# Patient Record
Sex: Male | Born: 1950 | Race: White | Hispanic: No | Marital: Married | Smoking: Never smoker
Health system: Southern US, Community
[De-identification: ages and names within clinical notes are randomized; demographics above are authoritative.]

## PROBLEM LIST (undated history)

## (undated) DIAGNOSIS — E291 Testicular hypofunction: Secondary | ICD-10-CM

## (undated) DIAGNOSIS — I10 Essential (primary) hypertension: Secondary | ICD-10-CM

## (undated) DIAGNOSIS — M792 Neuralgia and neuritis, unspecified: Secondary | ICD-10-CM

## (undated) DIAGNOSIS — N200 Calculus of kidney: Secondary | ICD-10-CM

## (undated) DIAGNOSIS — I219 Acute myocardial infarction, unspecified: Secondary | ICD-10-CM

## (undated) DIAGNOSIS — M545 Low back pain, unspecified: Secondary | ICD-10-CM

## (undated) DIAGNOSIS — F329 Major depressive disorder, single episode, unspecified: Secondary | ICD-10-CM

## (undated) DIAGNOSIS — I2510 Atherosclerotic heart disease of native coronary artery without angina pectoris: Secondary | ICD-10-CM

## (undated) DIAGNOSIS — G629 Polyneuropathy, unspecified: Secondary | ICD-10-CM

## (undated) DIAGNOSIS — G8929 Other chronic pain: Secondary | ICD-10-CM

## (undated) DIAGNOSIS — R29898 Other symptoms and signs involving the musculoskeletal system: Secondary | ICD-10-CM

## (undated) DIAGNOSIS — G5600 Carpal tunnel syndrome, unspecified upper limb: Secondary | ICD-10-CM

## (undated) DIAGNOSIS — N529 Male erectile dysfunction, unspecified: Secondary | ICD-10-CM

## (undated) DIAGNOSIS — R011 Cardiac murmur, unspecified: Secondary | ICD-10-CM

## (undated) DIAGNOSIS — G43909 Migraine, unspecified, not intractable, without status migrainosus: Secondary | ICD-10-CM

## (undated) DIAGNOSIS — J302 Other seasonal allergic rhinitis: Secondary | ICD-10-CM

## (undated) DIAGNOSIS — M1990 Unspecified osteoarthritis, unspecified site: Secondary | ICD-10-CM

## (undated) DIAGNOSIS — Z87442 Personal history of urinary calculi: Secondary | ICD-10-CM

## (undated) DIAGNOSIS — E7800 Pure hypercholesterolemia, unspecified: Secondary | ICD-10-CM

## (undated) DIAGNOSIS — F32A Depression, unspecified: Secondary | ICD-10-CM

## (undated) HISTORY — PX: CORONARY ANGIOPLASTY WITH STENT PLACEMENT: SHX49

## (undated) HISTORY — PX: EYE SURGERY: SHX253

## (undated) HISTORY — PX: HAND LIGAMENT RECONSTRUCTION: SHX1726

## (undated) HISTORY — PX: HIP SURGERY: SHX245

## (undated) HISTORY — PX: ORIF FINGER / THUMB FRACTURE: SUR932

## (undated) HISTORY — PX: CATARACT EXTRACTION: SUR2

## (undated) HISTORY — PX: FRACTURE SURGERY: SHX138

## (undated) HISTORY — PX: TONSILLECTOMY AND ADENOIDECTOMY: SUR1326

## (undated) HISTORY — PX: EXTRACORPOREAL SHOCK WAVE LITHOTRIPSY: SHX1557

## (undated) HISTORY — PX: BACK SURGERY: SHX140

## (undated) HISTORY — PX: CORONARY ARTERY BYPASS GRAFT: SHX141

## (undated) HISTORY — PX: KNEE ARTHROSCOPY: SHX127

## (undated) HISTORY — PX: OTHER SURGICAL HISTORY: SHX169

## (undated) HISTORY — PX: JOINT REPLACEMENT: SHX530

---

## 2003-01-14 ENCOUNTER — Encounter: Admission: RE | Admit: 2003-01-14 | Discharge: 2003-01-14 | Payer: Self-pay | Admitting: Neurosurgery

## 2003-01-14 ENCOUNTER — Encounter: Payer: Self-pay | Admitting: Neurosurgery

## 2003-06-18 HISTORY — PX: POSTERIOR LUMBAR FUSION 4 LEVEL: SHX6037

## 2007-01-19 ENCOUNTER — Ambulatory Visit: Payer: Self-pay | Admitting: Urology

## 2007-01-27 ENCOUNTER — Ambulatory Visit: Payer: Self-pay | Admitting: Urology

## 2007-02-05 ENCOUNTER — Ambulatory Visit: Payer: Self-pay | Admitting: Urology

## 2007-02-12 ENCOUNTER — Ambulatory Visit: Payer: Self-pay | Admitting: Urology

## 2007-02-27 ENCOUNTER — Ambulatory Visit: Payer: Self-pay | Admitting: Urology

## 2007-03-24 ENCOUNTER — Ambulatory Visit: Payer: Self-pay | Admitting: Urology

## 2007-04-20 ENCOUNTER — Ambulatory Visit: Payer: Self-pay | Admitting: Urology

## 2007-05-19 ENCOUNTER — Ambulatory Visit: Payer: Self-pay | Admitting: Unknown Physician Specialty

## 2007-11-01 ENCOUNTER — Emergency Department: Payer: Self-pay | Admitting: Emergency Medicine

## 2008-06-30 ENCOUNTER — Ambulatory Visit: Payer: Self-pay | Admitting: Specialist

## 2010-07-17 ENCOUNTER — Ambulatory Visit: Payer: Self-pay | Admitting: Unknown Physician Specialty

## 2010-07-19 LAB — PATHOLOGY REPORT

## 2011-04-04 ENCOUNTER — Ambulatory Visit: Payer: Self-pay | Admitting: General Surgery

## 2011-04-22 ENCOUNTER — Ambulatory Visit: Payer: Self-pay | Admitting: General Surgery

## 2011-04-24 LAB — PATHOLOGY REPORT

## 2011-05-02 ENCOUNTER — Ambulatory Visit: Payer: Self-pay | Admitting: Otolaryngology

## 2011-05-27 ENCOUNTER — Ambulatory Visit: Payer: Self-pay | Admitting: Physical Medicine and Rehabilitation

## 2013-04-06 ENCOUNTER — Ambulatory Visit: Payer: Self-pay | Admitting: Specialist

## 2013-04-06 DIAGNOSIS — I2510 Atherosclerotic heart disease of native coronary artery without angina pectoris: Secondary | ICD-10-CM

## 2013-04-06 LAB — CBC WITH DIFFERENTIAL/PLATELET
Eosinophil %: 2.6 %
HGB: 14 g/dL (ref 13.0–18.0)
Lymphocyte #: 1.6 10*3/uL (ref 1.0–3.6)
Lymphocyte %: 23.8 %
MCH: 24.7 pg — ABNORMAL LOW (ref 26.0–34.0)
MCHC: 32.7 g/dL (ref 32.0–36.0)
MCV: 76 fL — ABNORMAL LOW (ref 80–100)
Monocyte %: 5.3 %
Platelet: 253 10*3/uL (ref 150–440)
RBC: 5.7 10*6/uL (ref 4.40–5.90)
RDW: 17.4 % — ABNORMAL HIGH (ref 11.5–14.5)
WBC: 6.6 10*3/uL (ref 3.8–10.6)

## 2013-04-13 ENCOUNTER — Ambulatory Visit: Payer: Self-pay | Admitting: Specialist

## 2013-06-17 HISTORY — PX: CARPAL TUNNEL RELEASE: SHX101

## 2013-12-18 DIAGNOSIS — M549 Dorsalgia, unspecified: Secondary | ICD-10-CM

## 2013-12-18 DIAGNOSIS — N529 Male erectile dysfunction, unspecified: Secondary | ICD-10-CM | POA: Insufficient documentation

## 2013-12-18 DIAGNOSIS — I10 Essential (primary) hypertension: Secondary | ICD-10-CM | POA: Insufficient documentation

## 2013-12-18 DIAGNOSIS — G8929 Other chronic pain: Secondary | ICD-10-CM | POA: Insufficient documentation

## 2013-12-31 DIAGNOSIS — R739 Hyperglycemia, unspecified: Secondary | ICD-10-CM | POA: Insufficient documentation

## 2014-07-17 ENCOUNTER — Other Ambulatory Visit: Payer: Self-pay

## 2014-07-17 ENCOUNTER — Encounter (HOSPITAL_COMMUNITY)
Admission: EM | Disposition: A | Discharge status: Home / Self Care | Payer: Self-pay | Source: Ambulatory Visit | Attending: Cardiology

## 2014-07-17 ENCOUNTER — Encounter (HOSPITAL_COMMUNITY): Payer: Self-pay | Admitting: Cardiology

## 2014-07-17 ENCOUNTER — Emergency Department: Payer: Self-pay | Admitting: Emergency Medicine

## 2014-07-17 ENCOUNTER — Inpatient Hospital Stay (HOSPITAL_COMMUNITY)
Admission: EM | Admit: 2014-07-17 | Discharge: 2014-07-18 | DRG: 247 | Disposition: A | Discharge status: Home / Self Care | Payer: BC Managed Care – PPO | Source: Ambulatory Visit | Attending: Cardiology | Admitting: Cardiology

## 2014-07-17 DIAGNOSIS — R079 Chest pain, unspecified: Secondary | ICD-10-CM | POA: Diagnosis present

## 2014-07-17 DIAGNOSIS — I252 Old myocardial infarction: Secondary | ICD-10-CM | POA: Diagnosis not present

## 2014-07-17 DIAGNOSIS — I10 Essential (primary) hypertension: Secondary | ICD-10-CM | POA: Diagnosis present

## 2014-07-17 DIAGNOSIS — M549 Dorsalgia, unspecified: Secondary | ICD-10-CM | POA: Diagnosis present

## 2014-07-17 DIAGNOSIS — I2510 Atherosclerotic heart disease of native coronary artery without angina pectoris: Secondary | ICD-10-CM | POA: Diagnosis present

## 2014-07-17 DIAGNOSIS — I237 Postinfarction angina: Secondary | ICD-10-CM | POA: Diagnosis present

## 2014-07-17 DIAGNOSIS — G8929 Other chronic pain: Secondary | ICD-10-CM | POA: Diagnosis present

## 2014-07-17 DIAGNOSIS — I2119 ST elevation (STEMI) myocardial infarction involving other coronary artery of inferior wall: Principal | ICD-10-CM | POA: Diagnosis present

## 2014-07-17 DIAGNOSIS — Z8249 Family history of ischemic heart disease and other diseases of the circulatory system: Secondary | ICD-10-CM

## 2014-07-17 DIAGNOSIS — I219 Acute myocardial infarction, unspecified: Secondary | ICD-10-CM

## 2014-07-17 HISTORY — DX: Essential (primary) hypertension: I10

## 2014-07-17 HISTORY — DX: Acute myocardial infarction, unspecified: I21.9

## 2014-07-17 HISTORY — PX: LEFT HEART CATHETERIZATION WITH CORONARY ANGIOGRAM: SHX5451

## 2014-07-17 LAB — CBC WITH DIFFERENTIAL/PLATELET
BASOS ABS: 0.1 10*3/uL (ref 0.0–0.1)
Basophil %: 0.4 %
EOS ABS: 0.1 10*3/uL (ref 0.0–0.7)
Eosinophil %: 0.3 %
HCT: 48 % (ref 40.0–52.0)
HGB: 15.2 g/dL (ref 13.0–18.0)
LYMPHS PCT: 12.9 %
Lymphocyte #: 2.1 10*3/uL (ref 1.0–3.6)
MCH: 24.6 pg — ABNORMAL LOW (ref 26.0–34.0)
MCHC: 31.7 g/dL — AB (ref 32.0–36.0)
MCV: 78 fL — ABNORMAL LOW (ref 80–100)
Monocyte #: 2 x10 3/mm — ABNORMAL HIGH (ref 0.2–1.0)
Monocyte %: 12.3 %
Neutrophil #: 11.9 10*3/uL — ABNORMAL HIGH (ref 1.4–6.5)
Neutrophil %: 74.1 %
PLATELETS: 284 10*3/uL (ref 150–440)
RBC: 6.17 10*6/uL — ABNORMAL HIGH (ref 4.40–5.90)
RDW: 16.8 % — AB (ref 11.5–14.5)
WBC: 16 10*3/uL — ABNORMAL HIGH (ref 3.8–10.6)

## 2014-07-17 LAB — TROPONIN I
TROPONIN I: 45.7 ng/mL — AB (ref <0.031)
Troponin-I: 25 ng/mL — ABNORMAL HIGH

## 2014-07-17 LAB — POCT I-STAT, CHEM 8
BUN: 16 mg/dL (ref 6–23)
CHLORIDE: 101 mmol/L (ref 96–112)
CREATININE: 1 mg/dL (ref 0.50–1.35)
Calcium, Ion: 1.26 mmol/L (ref 1.13–1.30)
Glucose, Bld: 114 mg/dL — ABNORMAL HIGH (ref 70–99)
HCT: 44 % (ref 39.0–52.0)
Hemoglobin: 15 g/dL (ref 13.0–17.0)
POTASSIUM: 3.6 mmol/L (ref 3.5–5.1)
Sodium: 139 mmol/L (ref 135–145)
TCO2: 24 mmol/L (ref 0–100)

## 2014-07-17 LAB — LIPID PANEL
CHOL/HDL RATIO: 4.5 ratio
Cholesterol: 171 mg/dL (ref 0–200)
HDL: 38 mg/dL — AB (ref >39)
LDL Cholesterol: 117 mg/dL — ABNORMAL HIGH (ref 0–99)
TRIGLYCERIDES: 82 mg/dL (ref <150)
VLDL: 16 mg/dL (ref 0–40)

## 2014-07-17 LAB — POCT ACTIVATED CLOTTING TIME: ACTIVATED CLOTTING TIME: 220 s

## 2014-07-17 LAB — BASIC METABOLIC PANEL
Anion Gap: 7 (ref 7–16)
BUN: 16 mg/dL (ref 7–18)
CALCIUM: 9.4 mg/dL (ref 8.5–10.1)
CHLORIDE: 102 mmol/L (ref 98–107)
Co2: 31 mmol/L (ref 21–32)
Creatinine: 1.31 mg/dL — ABNORMAL HIGH (ref 0.60–1.30)
EGFR (African American): >60
EGFR (Non-African Amer.): 59 — ABNORMAL LOW
Glucose: 103 mg/dL — ABNORMAL HIGH (ref 65–99)
OSMOLALITY: 281 (ref 275–301)
Potassium: 3.7 mmol/L (ref 3.5–5.1)
Sodium: 140 mmol/L (ref 136–145)

## 2014-07-17 LAB — PROTIME-INR
INR: 1
Prothrombin Time: 12.9 secs

## 2014-07-17 LAB — APTT: Activated PTT: 26.9 secs (ref 23.6–35.9)

## 2014-07-17 LAB — TSH: TSH: 3.322 u[IU]/mL (ref 0.350–4.500)

## 2014-07-17 LAB — CK-MB: CK-MB: 131.9 ng/mL — ABNORMAL HIGH (ref 0.5–3.6)

## 2014-07-17 SURGERY — LEFT HEART CATHETERIZATION WITH CORONARY ANGIOGRAM
Anesthesia: LOCAL

## 2014-07-17 MED ORDER — VERAPAMIL HCL 2.5 MG/ML IV SOLN
INTRAVENOUS | Status: AC
  Filled 2014-07-17: qty 2

## 2014-07-17 MED ORDER — TICAGRELOR 90 MG PO TABS
90.0000 mg | ORAL_TABLET | Freq: Two times a day (BID) | ORAL | Status: DC
  Administered 2014-07-18: 90 mg via ORAL
  Filled 2014-07-17 (×2): qty 1

## 2014-07-17 MED ORDER — LIDOCAINE HCL (PF) 1 % IJ SOLN
INTRAMUSCULAR | Status: AC
  Filled 2014-07-17: qty 30

## 2014-07-17 MED ORDER — HYDROMORPHONE HCL 1 MG/ML IJ SOLN
INTRAMUSCULAR | Status: AC
  Filled 2014-07-17: qty 1

## 2014-07-17 MED ORDER — BIVALIRUDIN 250 MG IV SOLR
INTRAVENOUS | Status: AC
  Filled 2014-07-17: qty 250

## 2014-07-17 MED ORDER — HEPARIN (PORCINE) IN NACL 2-0.9 UNIT/ML-% IJ SOLN
INTRAMUSCULAR | Status: AC
  Filled 2014-07-17: qty 1000

## 2014-07-17 MED ORDER — MIDAZOLAM HCL 2 MG/2ML IJ SOLN
INTRAMUSCULAR | Status: AC
  Filled 2014-07-17: qty 2

## 2014-07-17 MED ORDER — NITROGLYCERIN 0.4 MG SL SUBL: 0.4000 mg | SUBLINGUAL_TABLET | SUBLINGUAL | Status: DC | PRN

## 2014-07-17 MED ORDER — CARVEDILOL 6.25 MG PO TABS
6.2500 mg | ORAL_TABLET | Freq: Two times a day (BID) | ORAL | Status: DC
  Administered 2014-07-18: 6.25 mg via ORAL
  Filled 2014-07-17 (×3): qty 1

## 2014-07-17 MED ORDER — ASPIRIN EC 81 MG PO TBEC
81.0000 mg | DELAYED_RELEASE_TABLET | Freq: Every day | ORAL | Status: DC
  Administered 2014-07-18: 81 mg via ORAL
  Filled 2014-07-17: qty 1

## 2014-07-17 MED ORDER — ATORVASTATIN CALCIUM 80 MG PO TABS
80.0000 mg | ORAL_TABLET | Freq: Every day | ORAL | Status: DC
  Filled 2014-07-17: qty 1

## 2014-07-17 MED ORDER — TICAGRELOR 90 MG PO TABS
ORAL_TABLET | ORAL | Status: AC
  Filled 2014-07-17: qty 2

## 2014-07-17 MED ORDER — OXYCODONE-ACETAMINOPHEN 5-325 MG PO TABS: 1.0000 | ORAL_TABLET | ORAL | Status: DC | PRN

## 2014-07-17 MED ORDER — ZOLPIDEM TARTRATE 5 MG PO TABS
5.0000 mg | ORAL_TABLET | Freq: Every evening | ORAL | Status: DC | PRN
  Administered 2014-07-17: 5 mg via ORAL
  Filled 2014-07-17: qty 1

## 2014-07-17 MED ORDER — NITROGLYCERIN 1 MG/10 ML FOR IR/CATH LAB
INTRA_ARTERIAL | Status: AC
  Filled 2014-07-17: qty 10

## 2014-07-17 MED ORDER — ALPRAZOLAM 0.25 MG PO TABS
0.2500 mg | ORAL_TABLET | Freq: Two times a day (BID) | ORAL | Status: DC | PRN
Start: 2014-07-17 — End: 2014-07-18

## 2014-07-17 MED ORDER — ACETAMINOPHEN 325 MG PO TABS
650.0000 mg | ORAL_TABLET | ORAL | Status: DC | PRN
  Administered 2014-07-18: 650 mg via ORAL
  Filled 2014-07-17: qty 2

## 2014-07-17 MED ORDER — TIROFIBAN HCL IV 12.5 MG/250 ML
INTRAVENOUS | Status: AC
  Filled 2014-07-17: qty 250

## 2014-07-17 MED ORDER — SODIUM CHLORIDE 0.9 % IV SOLN
INTRAVENOUS | Status: AC
  Administered 2014-07-17 – 2014-07-18 (×2): via INTRAVENOUS

## 2014-07-17 MED ORDER — HEPARIN SODIUM (PORCINE) 1000 UNIT/ML IJ SOLN
INTRAMUSCULAR | Status: AC
  Filled 2014-07-17: qty 1

## 2014-07-17 NOTE — H&P (Signed)
Adam Lucas is an 64 y.o. male.   Chief Complaint: Chest pain HPI: Patient is a 64 year old Caucasian male with history of hypertension, chronic back pain, who was admitted via [facility] with a diagnosis of ST elevation myocardial infarction. Upon arrival to the hospital, patient still having lingering chest pain, but he had completed his infarct with ST elevation in the infiltrates with formation of Q waves in the inferior leads. He was emergently taken to the Cath Lab due to ongoing chest discomfort.  Patient has been having chest pain for the past 3 days. Worse chest pain was yesterday afternoon in the form of burning sensation to heaviness in the middle of the chest. He thought this was related to GERD, to PPI without any relief.  Past medical history: Hypertension past surgical history: Back surgery in 2005.   family history: There is no family history of premature coronary artery disease although his father had coronary artery disease and myocardial infarction when he was in his 75s.  Social history: He does not recall call, he does not smoke tobacco. No history of illicit drug use.  Medications: Lisinopril, Cymbalta, omeprazole, lisinopril.   Review of Systems - Negative except Chest pain and chronic back pain.  Afebrile, heart rate 64 beats a minute, blood pressure 151/94 mmHg. Respiratory rate 14/m.  General appearance: alert, cooperative, appears stated age, no distress and mildly obese Eyes: negative findings: lids and lashes normal Neck: no adenopathy, no carotid bruit, no JVD, supple, symmetrical, trachea midline and thyroid not enlarged, symmetric, no tenderness/mass/nodules Neck: JVP - normal, carotids 2+= without bruits Resp: clear to auscultation bilaterally Chest wall: no tenderness Cardio: regular rate and rhythm, S1, S2 normal, no murmur, click, rub or gallop GI: soft, non-tender; bowel sounds normal; no masses,  no organomegaly Extremities:  extremities normal, atraumatic, no cyanosis or edema Pulses: 2+ and symmetric Skin: Skin color, texture, turgor normal. No rashes or lesions Neurologic: Grossly normal  No results found for this or any previous visit (from the past 48 hour(s)). No results found.  Labs:  No results found for: WBC, HGB, HCT, MCV, PLT No results for input(s): NA, K, CL, CO2, BUN, CREATININE, CALCIUM, PROT, BILITOT, ALKPHOS, ALT, AST, GLUCOSE in the last 168 hours.  Invalid input(s): LABALBU No results found for: CKTOTAL, CKMB, CKMBINDEX, TROPONINI  Lipid Panel  No results found for: CHOL, TRIG, HDL, CHOLHDL, VLDL, LDLCALC  EKG: 07/17/2014: Normal sinus rhythm, inferior infarct old with persistent ST elevation, early transition of are in the anterior leads, cannot include posterior infarct old. Consider inferior aneurysm formation.   Assessment/Plan 1. Postinfarct unstable angina pectoris 2. Subacute inferior wall myocardial infarction, Q waves noted in inferior leads with persistent S wave. 3. Hypertension  Recommendation: Patient due to ongoing burning sensation in the chest, will be taken to the cardiac catheterization lab on emergent basis. Further recommendations will follow.   [name], MD 07/17/2014, 5:40 PM [facility] Pager: [phone] Office: [phone] If no answer: Cell:  [phone]   \

## 2014-07-17 NOTE — CV Procedure (Signed)
Procedure performed:  Left heart catheterization including hemodynamic monitoring of the left ventricle, LV gram. Selective right and left coronary arteriography. Thrombectomy of the proximal RCA with aspiration of significant amount of thrombus burden.  PTCA and stenting of the proximal and mid RCA with implantation of a 2.75 x 24 mm Synergy DES.TIMI 0 flow improved to TIMI-3 flow at the end of the procedure.  Indication: Patient is a 64 year-old Caucasian male with history of hypertension, chronic backache who is transferred as an emergent cath for ST elevation myocardial infarction involving the inferior wall. On review, patient had completed his inferior infarct, but persisted to have chest discomfort. Hence taken to the catheterization lab on emergent basis.   Hemodynamic data: Left ventricular pressure was 94/2 with LVEDP of 4 mm mercury. Aortic pressure was 94/60 with a mean of 69 mm mercury. There was no pressure gradient across the aortic valve.   Left ventricle: Performed in the RAO projection revealed LVEF of 50-55 %. There was no significant MR. inferolateral hypokinesis.  Right coronary artery: Dominant. Occluded in the proximal segment. Faint collaterals  are noted from the left.  Left main coronary artery is large and normal.  Circumflex coronary artery: A large vessel giving origin to a large obtuse marginal 1.  the circumflex coronary artery of the origin of OM1 has a hazy 50-60% stenosis, etc. very large vessel and supplies a large territory. OM1 has mild disease. Proximal circumflex has mild luminal irregularity.  LAD:  LAD gives origin to a moderate size D1 and small to moderate-sized D2. The LAD in the midsegment has a hazy what appears like an ulcerated 80% stenosis in the midsegment. Excellent brisk flow is evident through the LAD. The diagonal 2 has a ostial 80% to 90% stenosis. It is a small vessel.    Impression:  completed inferior infarct with ongoing postinfarct  angina pectoris. Multivessel disease, right coronary artery being the culprit vessel. Patient has at least an intermediate risk lesion in the very large circumflex coronary artery distal segment. The diagonal lesion although hazy and appears ulcerated in some views, appears to be stable. Will proceed with angiography and angioplasty to the right coronary artery and will reevaluate his left system an elective basis unless he has recurrence of chest pain at rest. He will need repeat procedure once he is stable from acute myocardial infarction.  Interventional data: Successful Thrombectomy of the proximal RCA with aspiration of significant amount of thrombus burden.  PTCA and stenting of the proximal and mid RCA with implantation of a 2.75 x 24 mm Synergy DES.TIMI 0 flow improved to TIMI-3 flow at the end of the procedure.  Will need Dual antiplatelet therapy with  BRILINTA and ASA 81 mg for at least  one year or more.   Technique of diagnostic cardiac catheterization:  Under sterile precautions using a 6 French right radial  arterial access, a 6 French sheath was introduced into the right radial artery. A 5 [place] Tig 4 catheter was advanced into the ascending aorta selective  right coronary artery and left coronary artery was cannulated and angiography was performed in multiple views. The catheter was pulled back Out of the body over exchange length J-wire.  SameCatheter was used to perform LV gram which was performed in  LAO/RAO projection. Catheter exchanged out of the body over J-Wire. NO immediate complications noted.   Technique of intervention:  Using a 6 [place] Ik[place] 1.0  guide catheter tright  coronary  was selected and cannulated.  Using Angiomax for anticoagulation, I utilized a  cougar XTguidewire and across the  right coronary artery with moderate amount of  difficulty. I placed the tip of the wire into the distal  coronary artery. Angiography was performed.  There was no flow in spite of passing  the wire through the occlusion. I then utilized Pronto aspiration catheter and perform thrombectomy with aspiration of a organized large thrombus. Still there was no flow in the right coronary artery, hence I utilized a 2.5 x 15 mm [place]  balloon and balloon angioplasty at 6 and 8 atmospheric pressure was performed. Due to absence of flow, I then repeated aspiration thrombectomy with a fetch 2 catheter, but this is able to established TIMI-3 flow. Ulcerated lesion in the midsegment of the right coronary was evident. I then proceeded with stenting of the segment with the 2.75 x 24 mm Synergy DES deployed at 18 atmospheric pressure for 50 seconds, post dilated with a 3.0 x 20 mm emerge [place] at 16 atmospheric pressure 2. Intracoronary nitroglycerin was administered and angiography was performed. Brisk flow was evident with TIMI-3 flow, without evidence of edge dissection or residual thrombus. Excellent blush was evident in the distal myocardium. The guidewire was withdrawn, angiography repeated, guide catheter disengaged and pulled out of the body.  Patient although the fistula. There was no immediate complication. Hemostasis was obtained by applying TR band.

## 2014-07-17 NOTE — Progress Notes (Signed)
TR band removed at 2208. Site is a level 0. Gauze and tegaderm applied. Pt has no complaints at this time.

## 2014-07-17 NOTE — Progress Notes (Signed)
eLink Physician-Brief Progress Note Patient Name: Adam Lucas DOB: 1950-12-09 MRN: [id number]   Date of Service  07/17/2014  HPI/Events of Note  Admitted with ST elevation MI.   Underwent intervention in RCA. Presently stable from hemodynamic and resp standpoint.  eICU Interventions  Chart reviewed No new recs     Intervention Category Evaluation Type: New Patient Evaluation  Mauri Brooklyn, P 07/17/2014, 6:34 PM

## 2014-07-18 ENCOUNTER — Encounter (HOSPITAL_COMMUNITY): Payer: Self-pay

## 2014-07-18 LAB — MRSA PCR SCREENING: MRSA BY PCR: NEGATIVE

## 2014-07-18 LAB — BASIC METABOLIC PANEL
Anion gap: 6 (ref 5–15)
BUN: 13 mg/dL (ref 6–23)
CO2: 25 mmol/L (ref 19–32)
Calcium: 8.9 mg/dL (ref 8.4–10.5)
Chloride: 105 mmol/L (ref 96–112)
Creatinine, Ser: 1.13 mg/dL (ref 0.50–1.35)
GFR calc non Af Amer: 67 mL/min — ABNORMAL LOW (ref >90)
GFR, EST AFRICAN AMERICAN: 78 mL/min — AB (ref >90)
Glucose, Bld: 118 mg/dL — ABNORMAL HIGH (ref 70–99)
POTASSIUM: 3.9 mmol/L (ref 3.5–5.1)
SODIUM: 136 mmol/L (ref 135–145)

## 2014-07-18 LAB — CBC
HEMATOCRIT: 40.6 % (ref 39.0–52.0)
Hemoglobin: 13 g/dL (ref 13.0–17.0)
MCH: 24.9 pg — ABNORMAL LOW (ref 26.0–34.0)
MCHC: 32 g/dL (ref 30.0–36.0)
MCV: 77.8 fL — ABNORMAL LOW (ref 78.0–100.0)
Platelets: 207 10*3/uL (ref 150–400)
RBC: 5.22 MIL/uL (ref 4.22–5.81)
RDW: 16.6 % — AB (ref 11.5–15.5)
WBC: 14 10*3/uL — ABNORMAL HIGH (ref 4.0–10.5)

## 2014-07-18 LAB — TROPONIN I
TROPONIN I: 41.08 ng/mL — AB (ref <0.031)
Troponin I: 54.55 ng/mL (ref <0.031)

## 2014-07-18 MED ORDER — TICAGRELOR 90 MG PO TABS: 90.0000 mg | ORAL_TABLET | Freq: Two times a day (BID) | ORAL | Status: DC

## 2014-07-18 MED ORDER — CARVEDILOL 6.25 MG PO TABS: 6.2500 mg | ORAL_TABLET | Freq: Two times a day (BID) | ORAL | Status: AC

## 2014-07-18 MED ORDER — ASPIRIN 81 MG PO TBEC
81.0000 mg | DELAYED_RELEASE_TABLET | Freq: Every day | ORAL | Status: DC
Start: 2014-07-18 — End: 2017-10-22

## 2014-07-18 MED ORDER — OMEPRAZOLE 40 MG PO CPDR: 40.0000 mg | DELAYED_RELEASE_CAPSULE | Freq: Every day | ORAL | Status: DC

## 2014-07-18 MED ORDER — NITROGLYCERIN 0.4 MG SL SUBL: 0.4000 mg | SUBLINGUAL_TABLET | SUBLINGUAL | Status: DC | PRN

## 2014-07-18 MED ORDER — ATORVASTATIN CALCIUM 80 MG PO TABS: 80.0000 mg | ORAL_TABLET | Freq: Every day | ORAL | Status: DC

## 2014-07-18 MED FILL — Sodium Chloride IV Soln 0.9%: INTRAVENOUS | Qty: 50 | Status: AC

## 2014-07-18 NOTE — Care Management Note (Signed)
    Page 1 of 1   07/18/2014     10:15:40 AM CARE MANAGEMENT NOTE 07/18/2014  Patient:  Adam Lucas, Adam Lucas   Account Number:  [id number]  Date Initiated:  07/18/2014  Documentation initiated by:  Elissa Hefty  Subjective/Objective Assessment:   adm w mi     Action/Plan:   lives w wife, pt states bcbs ins   Anticipated DC Date:     Anticipated DC Plan:  [place]  CM consult  Medication Assistance      Choice offered to / List presented to:             Status of service:   Medicare Important Message given?   (If response is "NO", the following Medicare IM given date fields will be blank) Date Medicare IM given:   Medicare IM given by:   Date Additional Medicare IM given:   Additional Medicare IM given by:    Discharge Disposition:  HOME/SELF CARE  Per UR Regulation:  Reviewed for med. necessity/level of care/duration of stay  If discussed at [place] of Stay Meetings, dates discussed:    Comments:  2/1  1014 debbie Tuvia Woodrick rn,bsn gave pt 30day free and copay assist card for brilinta. pt states has bcbs ins.

## 2014-07-18 NOTE — Progress Notes (Addendum)
CARDIAC REHAB PHASE I   PRE:  Rate/Rhythm: 97 SR  BP:  Supine:   Sitting: 108/61  Standing:    SaO2: 96 RA  MODE:  Ambulation: 350 ft   POST:  Rate/Rhythm: 123 ST  BP:  Supine:   Sitting: 111/58  Standing:    SaO2: 98 RA 1120--1215 Pt tolerated ambulation well without c/o of cp or SOB.He c/o of back discomfort walking. HR after walk 123 ST, reported to RN. Completed MI and stent education with pt. Pt voices understanding. Gave pt light walking guidelines. We did discuss Outpt. CRP and I gave him information on program in [place]. He knows that he will have to have follow up with Dr Einar Gip before getting started in that program.  Rodney Langton RN 07/18/2014 12:16 PM

## 2014-07-18 NOTE — Discharge Summary (Addendum)
Physician Discharge Summary  Patient ID: Adam Lucas MRN: [id number] DOB/AGE: [birth year] 64 y.o.  Admit date: 07/17/2014 Discharge date: 07/18/2014  Primary Discharge Diagnosis: Post infarct angina pectoris. STEMI (inferior wall probably <24 hours duration)  s/p thromectomy, PTCA and stenting of the proximal and mid RCA with implantation of a 2.75 x 24 mm Synergy DES. CAD of the native coronary vessel  Secondary diagnosis: Hypertension Hyperlipidemia  Significant Diagnostic Studies: Coronary angiography 07/18/2014: completed inferior infarct with ongoing postinfarct angina pectoris. Multivessel disease, right coronary artery being the culprit vessel. Successful thrombectomy followed by  stenting of the proximal and mid RCA with implantation of a 2.75 x 24 mm Synergy DES.  Hospital Course:  Patient is a 64 year old Caucasian male with history of hypertension, chronic back pain, who was admitted via [facility] with a diagnosis of ST elevation myocardial infarction. Upon arrival to the hospital, patient still having lingering chest pain, but he had completed his infarct with ST elevation in the infiltrates with formation of Q waves in the inferior leads. He was emergently taken to the Cath Lab due to ongoing chest discomfort.  Successful thrombectomy of the proximal RCA and PTCA and stenting of the proximal and mid RCA.    He denies any chest pain presently.  He reports mild, intermittent SOB but otherwise feels well.  Sitting up in chair eating breakfast presently. Dressing remains on right radial access site, no bleeding or hematoma, 2+ radial pulse.  Recommendations on discharge: Follow up in office in 1 week.  Will likely need repeat angiography and angioplasty of circumflex lesion/LAD on an elective basis. Continue on Brilinta and ASA 81mg  for at least one year. Continue carvedilol and atorvastatin at discharge.   Patient has at least an intermediate risk lesion in the  very large circumflex coronary artery distal segment probably 70-80% in some views and appears hazy. The LAD/diagonal lesion although hazy and appears ulcerated in some views, appears to be stable but has high grade stenosis of 80%. Will reevaluate his left system on an elective basis unless he has recurrence of chest pain at rest. He will need repeat procedure once he is stable from acute myocardial infarction probably in 1 week to 10 days and patient advised not to do heavy exertional activity for now and to call if recurrent chest pain.  Discharge Exam: Blood pressure 109/72, pulse 93, temperature 98.9 F (37.2 C), temperature source Oral, resp. rate 23, height 5\' 9"  (1.753 m), weight 90.6 kg (199 lb 11.8 oz), SpO2 93 %.    General appearance: alert, cooperative, appears stated age, no distress and mildly obese Neck: no adenopathy, no carotid bruit, no JVD, supple, symmetrical, trachea midline and thyroid not enlarged, symmetric, no tenderness/mass/nodules Resp: clear to auscultation bilaterally Chest wall: no tenderness Cardio: regular rate and rhythm, S1, S2 normal, II/VI SEM in the aortic area, No click, rub or gallop Extremities: extremities normal, atraumatic, no cyanosis or edema Pulses: 2+ and symmetric, right radial access has healed well. Skin: Skin color, texture, turgor normal. No rashes or lesions Labs:   Lab Results  Component Value Date   WBC 14.0* 07/18/2014   HGB 13.0 07/18/2014   HCT 40.6 07/18/2014   MCV 77.8* 07/18/2014   PLT 207 07/18/2014    Recent Labs Lab 07/18/14 0626  NA 136  K 3.9  CL 105  CO2 25  BUN 13  CREATININE 1.13  CALCIUM 8.9  GLUCOSE 118*   Lab Results  Component Value Date  TROPONINI 41.08* 07/18/2014    Lipid Panel     Component Value Date/Time   CHOL 171 07/17/2014 1830   TRIG 82 07/17/2014 1830   HDL 38* 07/17/2014 1830   CHOLHDL 4.5 07/17/2014 1830   VLDL 16 07/17/2014 1830   LDLCALC 117* 07/17/2014 1830    EKG: 07/18/2014:  Normal sinus rhythm, left axis deviation, left anterior fascicular block, inferior infarct old, early transition of r wave in the anterior leads, cannot include posterior infarct old.  FOLLOW UP PLANS AND APPOINTMENTS    Medication List    STOP taking these medications        lisinopril 10 MG tablet  Commonly known as:  PRINIVIL,ZESTRIL      TAKE these medications        aspirin 81 MG EC tablet  Take 1 tablet (81 mg total) by mouth daily.     atorvastatin 80 MG tablet  Commonly known as:  LIPITOR  Take 1 tablet (80 mg total) by mouth daily at 6 PM.     carvedilol 6.25 MG tablet  Commonly known as:  COREG  Take 1 tablet (6.25 mg total) by mouth 2 (two) times daily with a meal.     cyanocobalamin 500 MCG tablet  Take 1 tablet by mouth daily.     DULoxetine 60 MG capsule  Commonly known as:  CYMBALTA  Take 1 capsule by mouth daily.     nitroGLYCERIN 0.4 MG SL tablet  Commonly known as:  NITROSTAT  Place 1 tablet (0.4 mg total) under the tongue every 5 (five) minutes x 3 doses as needed for chest pain.     omeprazole 40 MG capsule  Commonly known as:  PRILOSEC  Take 1 capsule (40 mg total) by mouth daily before breakfast.     pregabalin 150 MG capsule  Commonly known as:  LYRICA  Take 1 tablet by mouth 2 (two) times daily.     ticagrelor 90 MG Tabs tablet  Commonly known as:  BRILINTA  Take 1 tablet (90 mg total) by mouth 2 (two) times daily.     traMADol 50 MG tablet  Commonly known as:  ULTRAM  Take 2 tablets by mouth 3 (three) times daily as needed.     TYLENOL ARTHRITIS PAIN PO  Take 2 tablets by mouth daily.     VITAMIN C PO  Take 1 tablet by mouth daily.          Rachel Bo, NP-C 07/18/2014, 8:57 AM [facility] Pager: [phone] Office: [phone]  I have personally reviewed the patient's record and performed physical exam and agree with the assessment and plan of Ms. Neldon Labella, NP-C.  [name], MD [facility] Pager: [phone].   Office: [phone] If no answer: Mobile: [phone]

## 2014-08-09 ENCOUNTER — Encounter (HOSPITAL_COMMUNITY)
Admission: RE | Disposition: A | Discharge status: Home / Self Care | Payer: BC Managed Care – PPO | Source: Ambulatory Visit | Attending: Cardiology

## 2014-08-09 ENCOUNTER — Ambulatory Visit (HOSPITAL_COMMUNITY)
Admission: RE | Admit: 2014-08-09 | Discharge: 2014-08-10 | Disposition: A | Discharge status: Home / Self Care | Payer: BC Managed Care – PPO | Source: Ambulatory Visit | Attending: Cardiology | Admitting: Cardiology

## 2014-08-09 ENCOUNTER — Encounter (HOSPITAL_COMMUNITY): Payer: Self-pay | Admitting: General Practice

## 2014-08-09 DIAGNOSIS — I2119 ST elevation (STEMI) myocardial infarction involving other coronary artery of inferior wall: Secondary | ICD-10-CM

## 2014-08-09 DIAGNOSIS — M545 Low back pain: Secondary | ICD-10-CM | POA: Diagnosis not present

## 2014-08-09 DIAGNOSIS — I25119 Atherosclerotic heart disease of native coronary artery with unspecified angina pectoris: Secondary | ICD-10-CM

## 2014-08-09 DIAGNOSIS — G8929 Other chronic pain: Secondary | ICD-10-CM | POA: Insufficient documentation

## 2014-08-09 DIAGNOSIS — I10 Essential (primary) hypertension: Secondary | ICD-10-CM | POA: Insufficient documentation

## 2014-08-09 DIAGNOSIS — I2510 Atherosclerotic heart disease of native coronary artery without angina pectoris: Secondary | ICD-10-CM | POA: Diagnosis present

## 2014-08-09 DIAGNOSIS — I252 Old myocardial infarction: Secondary | ICD-10-CM | POA: Insufficient documentation

## 2014-08-09 DIAGNOSIS — Z955 Presence of coronary angioplasty implant and graft: Secondary | ICD-10-CM

## 2014-08-09 DIAGNOSIS — E780 Pure hypercholesterolemia: Secondary | ICD-10-CM | POA: Diagnosis not present

## 2014-08-09 DIAGNOSIS — M1990 Unspecified osteoarthritis, unspecified site: Secondary | ICD-10-CM | POA: Insufficient documentation

## 2014-08-09 DIAGNOSIS — E785 Hyperlipidemia, unspecified: Secondary | ICD-10-CM | POA: Insufficient documentation

## 2014-08-09 HISTORY — DX: Low back pain, unspecified: M54.50

## 2014-08-09 HISTORY — DX: Acute myocardial infarction, unspecified: I21.9

## 2014-08-09 HISTORY — DX: Migraine, unspecified, not intractable, without status migrainosus: G43.909

## 2014-08-09 HISTORY — DX: Calculus of kidney: N20.0

## 2014-08-09 HISTORY — DX: Polyneuropathy, unspecified: G62.9

## 2014-08-09 HISTORY — PX: CORONARY ANGIOGRAM: SHX5466

## 2014-08-09 HISTORY — DX: Low back pain: M54.5

## 2014-08-09 HISTORY — DX: Other chronic pain: G89.29

## 2014-08-09 HISTORY — DX: Cardiac murmur, unspecified: R01.1

## 2014-08-09 HISTORY — PX: PERCUTANEOUS CORONARY STENT INTERVENTION (PCI-S): SHX5485

## 2014-08-09 HISTORY — DX: Pure hypercholesterolemia, unspecified: E78.00

## 2014-08-09 HISTORY — DX: Atherosclerotic heart disease of native coronary artery without angina pectoris: I25.10

## 2014-08-09 HISTORY — DX: Unspecified osteoarthritis, unspecified site: M19.90

## 2014-08-09 LAB — PROTIME-INR
INR: 1.06 (ref 0.00–1.49)
Prothrombin Time: 14 seconds (ref 11.6–15.2)

## 2014-08-09 LAB — POCT ACTIVATED CLOTTING TIME: Activated Clotting Time: 331 seconds

## 2014-08-09 SURGERY — CORONARY ANGIOGRAM

## 2014-08-09 MED ORDER — TRAMADOL HCL 50 MG PO TABS: 100.0000 mg | ORAL_TABLET | Freq: Three times a day (TID) | ORAL | Status: DC | PRN

## 2014-08-09 MED ORDER — SODIUM CHLORIDE 0.9 % IJ SOLN: 3.0000 mL | Freq: Two times a day (BID) | INTRAMUSCULAR | Status: DC

## 2014-08-09 MED ORDER — SODIUM CHLORIDE 0.9 % IV SOLN
Freq: Once | INTRAVENOUS | Status: AC
  Administered 2014-08-09: 10:00:00 via INTRAVENOUS

## 2014-08-09 MED ORDER — NITROGLYCERIN 1 MG/10 ML FOR IR/CATH LAB
INTRA_ARTERIAL | Status: AC
  Filled 2014-08-09: qty 10

## 2014-08-09 MED ORDER — SODIUM CHLORIDE 0.9 % IV SOLN: 1.0000 mL/kg/h | INTRAVENOUS | Status: AC

## 2014-08-09 MED ORDER — CARVEDILOL 3.125 MG PO TABS
6.2500 mg | ORAL_TABLET | Freq: Two times a day (BID) | ORAL | Status: DC
  Administered 2014-08-09: 6.25 mg via ORAL
  Filled 2014-08-09: qty 2

## 2014-08-09 MED ORDER — ASPIRIN 81 MG PO CHEW: 81.0000 mg | CHEWABLE_TABLET | ORAL | Status: DC

## 2014-08-09 MED ORDER — ZOLPIDEM TARTRATE 5 MG PO TABS: 5.0000 mg | ORAL_TABLET | Freq: Every evening | ORAL | Status: DC | PRN

## 2014-08-09 MED ORDER — NITROGLYCERIN 0.4 MG SL SUBL: 0.4000 mg | SUBLINGUAL_TABLET | SUBLINGUAL | Status: DC | PRN

## 2014-08-09 MED ORDER — SODIUM CHLORIDE 0.9 % IV SOLN
0.2500 mg/kg/h | INTRAVENOUS | Status: DC
  Filled 2014-08-09: qty 250

## 2014-08-09 MED ORDER — ASPIRIN EC 81 MG PO TBEC: 81.0000 mg | DELAYED_RELEASE_TABLET | Freq: Every day | ORAL | Status: DC

## 2014-08-09 MED ORDER — SODIUM CHLORIDE 0.9 % IV SOLN: 250.0000 mL | INTRAVENOUS | Status: DC | PRN

## 2014-08-09 MED ORDER — ATORVASTATIN CALCIUM 80 MG PO TABS
80.0000 mg | ORAL_TABLET | Freq: Every day | ORAL | Status: DC
  Administered 2014-08-09: 80 mg via ORAL
  Filled 2014-08-09 (×2): qty 1

## 2014-08-09 MED ORDER — DULOXETINE HCL 60 MG PO CPEP
60.0000 mg | ORAL_CAPSULE | Freq: Every day | ORAL | Status: DC
Start: 2014-08-10 — End: 2014-08-10
  Filled 2014-08-09: qty 1

## 2014-08-09 MED ORDER — ONDANSETRON HCL 4 MG/2ML IJ SOLN: 4.0000 mg | Freq: Four times a day (QID) | INTRAMUSCULAR | Status: DC | PRN

## 2014-08-09 MED ORDER — SODIUM CHLORIDE 0.9 % IJ SOLN: 3.0000 mL | INTRAMUSCULAR | Status: DC | PRN

## 2014-08-09 MED ORDER — VERAPAMIL HCL 2.5 MG/ML IV SOLN
INTRAVENOUS | Status: AC
  Filled 2014-08-09: qty 2

## 2014-08-09 MED ORDER — HYDROMORPHONE HCL 1 MG/ML IJ SOLN
INTRAMUSCULAR | Status: AC
  Filled 2014-08-09: qty 1

## 2014-08-09 MED ORDER — ACETAMINOPHEN 325 MG PO TABS
650.0000 mg | ORAL_TABLET | Freq: Three times a day (TID) | ORAL | Status: DC
  Administered 2014-08-09: 650 mg via ORAL
  Filled 2014-08-09: qty 2

## 2014-08-09 MED ORDER — CYANOCOBALAMIN 500 MCG PO TABS
500.0000 ug | ORAL_TABLET | Freq: Every day | ORAL | Status: DC
  Filled 2014-08-09 (×2): qty 1

## 2014-08-09 MED ORDER — HEPARIN (PORCINE) IN NACL 2-0.9 UNIT/ML-% IJ SOLN
INTRAMUSCULAR | Status: AC
  Filled 2014-08-09: qty 1000

## 2014-08-09 MED ORDER — PREGABALIN 25 MG PO CAPS
150.0000 mg | ORAL_CAPSULE | Freq: Two times a day (BID) | ORAL | Status: DC
  Administered 2014-08-09: 150 mg via ORAL
  Filled 2014-08-09: qty 2
  Filled 2014-08-09: qty 1

## 2014-08-09 MED ORDER — LIDOCAINE HCL (PF) 1 % IJ SOLN
INTRAMUSCULAR | Status: AC
  Filled 2014-08-09: qty 30

## 2014-08-09 MED ORDER — SODIUM CHLORIDE 0.9 % IV SOLN: INTRAVENOUS | Status: DC

## 2014-08-09 MED ORDER — MIDAZOLAM HCL 2 MG/2ML IJ SOLN
INTRAMUSCULAR | Status: AC
  Filled 2014-08-09: qty 2

## 2014-08-09 MED ORDER — BIVALIRUDIN 250 MG IV SOLR
INTRAVENOUS | Status: AC
  Filled 2014-08-09: qty 250

## 2014-08-09 MED ORDER — PANTOPRAZOLE SODIUM 40 MG PO TBEC
40.0000 mg | DELAYED_RELEASE_TABLET | Freq: Every day | ORAL | Status: DC
  Administered 2014-08-09: 16:00:00 40 mg via ORAL
  Filled 2014-08-09: qty 1

## 2014-08-09 MED ORDER — TICAGRELOR 90 MG PO TABS
90.0000 mg | ORAL_TABLET | Freq: Two times a day (BID) | ORAL | Status: DC
  Administered 2014-08-09: 22:00:00 90 mg via ORAL
  Filled 2014-08-09 (×3): qty 1

## 2014-08-09 MED ORDER — ACETAMINOPHEN ER 650 MG PO TBCR: 650.0000 mg | EXTENDED_RELEASE_TABLET | Freq: Three times a day (TID) | ORAL | Status: DC

## 2014-08-09 NOTE — Progress Notes (Signed)
Toni Arthurs RN assuming care for D. Young lunch relief

## 2014-08-09 NOTE — CV Procedure (Signed)
Procedure performed: Selective  left coronary arteriography. PTCA and stenting to Circumflex and LAD on 08/09/2014: Distal circumflex 80% stenosis s/p 4.0x28 Promus Premier DES and Mid LAD 80% stenosis S/P 2.75x12 mm Promus premier DES. Diagonal 2 to ostial 90% stenosis, small. Diffuse disease medical therapy.  Indication: Patient is a 64 year-old Caucasian male with history of hypertension,  hyperlipidemia, history of inferior and posterior acute myocardial infarction, presenting with post infarct angina on 07/17/2014 and underwent successful angioplasty to an occluded dominant RCA in the proximal segment, status post thrombectomy followed by 2.75 x 24 mm Synergy DES in the proximal to midsegment of the RCA, postdilated with a 3.0 x 20 mm [place].  He has LVEF 50% with inferolateral hypokinesis. He was found to have residual mid LAD stenosis and mid circumflex stenosis. Hence was electively brought for repeat coronary angiography and possible angioplasty.  Hemodynamic data: Aortic pressure was 119/78 with a mean of 98 mm mercury.   Angiographic data:  Left main coronary artery is large and normal, bifurcates immediately into LAD and circumflex.  Circumflex coronary artery: A large vessel giving origin to a large obtuse marginal 1. The circumflex coronary artery of the origin of OM1 has a hazy 80% stenosis. It is a. very large vessel and supplies a large territory. OM1 has mild disease. Proximal circumflex has mild luminal irregularity.  LAD: LAD gives origin to a moderate size D1 and small to moderate-sized D2. The LAD has mild to moderate diffuse disease and is much smaller than the circumflex coronary artery. In the midsegment there is a 80-90% focal stenosis followed by eccentric 30-40% stenosis in the mid to distal segment. Diagonal 2 has a ostial  90% stenosis. It is a small vessel.  Right coronary artery: Not studied.  Impression: PTCA and stenting to Circumflex and LAD on 08/09/2014: Distal  circumflex 80% stenosis s/p 4.0x28 Promus Premier DES and Mid LAD 80% stenosis S/P 2.75x12 mm Promus premier DES.  Medical therapy for D2 disease. LAD shows diffuse disease.  Technique of diagnostic cardiac catheterization:  Under sterile precautions using a 6 French right radial  arterial access, a 6 French sheath was introduced into the right radial artery. A 5 [place] Tig 4 catheter was advanced into the ascending aorta selective   left coronary artery was cannulated and angiography was performed in multiple views. The catheter was pulled back Out of the body over exchange length J-wire.  Technique of intervention:  Using a 6 [place] XB 3.5 guide catheter the left circumflex  coronary  was selected and cannulated. Using Angiomax for anticoagulation, I utilized a cougar XT guidewire and across the circumflex coronary artery without any difficulty. I placed the tip of the wire into the distal  coronary artery. Angiography was performed. I then directly stented this with the 4.0 x 28 mm Promus Premier DES deployed at 10 atmospheric pressure for 60 seconds. The stent was post dilated in the mid and proximal segments with a 4.0 x 12 mm [place] at 12 and 14 atmospheric pressure for 30 seconds each. Intracoronary nitroglycerin was administered and angiography was repeated. The guidewire was withdrawn and the guide catheter disengaged from the circumflex coronary artery.  The guide catheter was then manipulated to engage the LAD selectively. The same guidewire was utilized to engage and across the LAD and angiography was repeated in multiple views to confirm severity of LAD stenosis. This was followed by direct implantation of a 2.75 x 12 mm Promus Premier DES in the midsegment  of the LAD deployed at 14 atmospheric pressure for 60 seconds and postdilated with a 2.75 x 8 mm [place] at 18 atmospheric pressure for 45 seconds. IC nitroglycerin administered and angiography repeated, excellent result was evident with  TIMI-3 flow without edge dissection. The guidewire withdrawn, guide catheter disengaged and pulled out of the body over the J-wire. Hemostasis was obtained by applying TR band. A total of 1 60 mL of contrast was utilized for diagnostic angiography and interventional procedure.

## 2014-08-09 NOTE — Interval H&P Note (Signed)
History and Physical Interval Note: Patient is a 64 year old Caucasian male with history of hypertension, chronic back pain, who was admitted via [facility] with a diagnosis of ST elevation myocardial infarction on 07/17/2014. Upon arrival to the hospital, patient still having lingering chest pain, but he had completed his infarct with ST elevation in the infiltrates with formation of Q waves in the inferior leads. He was emergently taken to the Cath Lab due to ongoing chest discomfort. Successful thrombectomy of the proximal RCA and PTCA and stenting of the proximal and mid RCA.  He denies any chest pain presently.Otherwise feels well.  Patient has at least an intermediate risk lesion in the very large circumflex coronary artery distal segment probably 70-80% in some views and appears hazy. The LAD/diagonal lesion although hazy and appears ulcerated in some views, appears to be stable but has high grade stenosis of 80%. Hence scheduled for elective repeat angiography and angioplasty. Patient has not exerted since discharge and has not had recurrent chest pain.  08/09/2014 10:04 AM  [name]  has presented today for surgery, with the diagnosis of Urgent  The various methods of treatment have been discussed with the patient and family. After consideration of risks, benefits and other options for treatment, the patient has consented to  Procedure(s): LEFT HEART CATHETERIZATION WITH CORONARY ANGIOGRAM (N/A) and possible PCI as a surgical intervention .  The patient's history has been reviewed, patient examined, no change in status, stable for surgery.  I have reviewed the patient's chart and labs.  Questions were answered to the patient's satisfaction.   Cath Lab Visit (complete for each Cath Lab visit)  Clinical Evaluation Leading to the Procedure:   ACS: Yes.    Non-ACS:    Anginal Classification: CCS IV  Anti-ischemic medical therapy: Minimal Therapy (1 class of  medications)  Non-Invasive Test Results: No non-invasive testing performed  Prior CABG: No previous CABG        [facility]

## 2014-08-10 DIAGNOSIS — G8929 Other chronic pain: Secondary | ICD-10-CM | POA: Diagnosis not present

## 2014-08-10 DIAGNOSIS — I2510 Atherosclerotic heart disease of native coronary artery without angina pectoris: Secondary | ICD-10-CM | POA: Diagnosis not present

## 2014-08-10 DIAGNOSIS — I252 Old myocardial infarction: Secondary | ICD-10-CM | POA: Diagnosis not present

## 2014-08-10 DIAGNOSIS — I10 Essential (primary) hypertension: Secondary | ICD-10-CM | POA: Diagnosis not present

## 2014-08-10 LAB — BASIC METABOLIC PANEL
ANION GAP: 4 — AB (ref 5–15)
BUN: 14 mg/dL (ref 6–23)
CALCIUM: 8.8 mg/dL (ref 8.4–10.5)
CO2: 29 mmol/L (ref 19–32)
CREATININE: 1.12 mg/dL (ref 0.50–1.35)
Chloride: 105 mmol/L (ref 96–112)
GFR calc non Af Amer: 68 mL/min — ABNORMAL LOW (ref >90)
GFR, EST AFRICAN AMERICAN: 78 mL/min — AB (ref >90)
Glucose, Bld: 128 mg/dL — ABNORMAL HIGH (ref 70–99)
POTASSIUM: 4.2 mmol/L (ref 3.5–5.1)
Sodium: 138 mmol/L (ref 135–145)

## 2014-08-10 LAB — CBC
HCT: 40.7 % (ref 39.0–52.0)
HEMOGLOBIN: 12.8 g/dL — AB (ref 13.0–17.0)
MCH: 24.6 pg — AB (ref 26.0–34.0)
MCHC: 31.4 g/dL (ref 30.0–36.0)
MCV: 78.1 fL (ref 78.0–100.0)
PLATELETS: 222 10*3/uL (ref 150–400)
RBC: 5.21 MIL/uL (ref 4.22–5.81)
RDW: 15.8 % — ABNORMAL HIGH (ref 11.5–15.5)
WBC: 7.4 10*3/uL (ref 4.0–10.5)

## 2014-08-10 MED FILL — Sodium Chloride IV Soln 0.9%: INTRAVENOUS | Qty: 50 | Status: AC

## 2014-08-10 NOTE — Discharge Summary (Signed)
Physician Discharge Summary  Patient ID: Adam Lucas MRN: [id number] DOB/AGE: 1951-05-22 64 y.o.  Admit date: 08/09/2014 Discharge date: 08/10/2014  Primary Discharge Diagnosis: CAD of the native coronary arteries, PTCA and stenting of the Cx and LAD. Secondary Discharge Diagnosis: HTN, HLD  Significant Diagnostic Studies: Coronary angiogram 08/10/2014: Distal circumflex 80% stenosis s/p 4.0x28 Promus Premier DES and Mid LAD 80% stenosis S/P 2.75x12 mm Promus premier DES. Medical therapy for D2 disease. LAD shows diffuse disease. H/O post STEMI angina on 07/17/2014 S/P proximal and mid RCA with implantation of a 2.75 x 24 mm Synergy DES.  Hospital Course: Patient is a 64 year old Caucasian male with history of hypertension, chronic back pain, who was admitted on 07/17/2014 for STEMI and underwent successful thrombectomy of the proximal RCA and PTCA and stenting of the proximal and mid RCA with residual stenosis to the LAD, D2, and Cx. After allowing for adequate time for stabilization from STEMI, he was brought back to the cath lab on 08/09/2014 on an elective basis to reevaluate his left system. He had not exerted himself since previous discharge and has not had recurrent chest pain.  He underwent successful PTCA and stenting to Circumflex with 4.0x28 Promus Premier DES and LAD with 2.75x12 mm Promus premier DES.   Recommendations on discharge: Continue DAPT with ASA and Brilinta for at least one year with aggressive risk factor modification. Follow up outpatient in 1 week.     Discharge Exam: Blood pressure 125/75, pulse 72, temperature 98.2 F (36.8 C), temperature source Oral, resp. rate 20, height 5\' 9"  (1.753 m), weight 93.6 kg (206 lb 5.6 oz), SpO2 98 %.    General appearance: alert, cooperative, appears stated age, no distress and mildly obese Neck: no adenopathy, no carotid bruit, no JVD, supple, symmetrical, trachea midline and thyroid not enlarged, symmetric, no  tenderness/mass/nodules Resp: clear to auscultation bilaterally Chest wall: no tenderness Cardio: regular rate and rhythm, S1, S2 normal, II/VI SEM in the aortic area, No click, rub or gallop Extremities: extremities normal, atraumatic, no cyanosis or edema Pulses: 2+ and symmetric, right radial access has healed well. Skin: Skin color, texture, turgor normal. No rashes or lesions  Labs:   Lab Results  Component Value Date   WBC 7.4 08/10/2014   HGB 12.8* 08/10/2014   HCT 40.7 08/10/2014   MCV 78.1 08/10/2014   PLT 222 08/10/2014    Recent Labs Lab 08/10/14 0243  NA 138  K 4.2  CL 105  CO2 29  BUN 14  CREATININE 1.12  CALCIUM 8.8  GLUCOSE 128*   Lab Results  Component Value Date   TROPONINI 41.08* 07/18/2014    Lipid Panel     Component Value Date/Time   CHOL 171 07/17/2014 1830   TRIG 82 07/17/2014 1830   HDL 38* 07/17/2014 1830   CHOLHDL 4.5 07/17/2014 1830   VLDL 16 07/17/2014 1830   LDLCALC 117* 07/17/2014 1830    EKG 08/10/2014: NSR at a rate of 65 bpm, normal axis, normal intervals.  Inferior and anterolateral T wave inversion, poor R-wave progression.    Radiology: No results found.    FOLLOW UP PLANS AND APPOINTMENTS Discharge Instructions    Amb Referral to Cardiac Rehabilitation    Complete by:  As directed   To [place]     Discharge patient    Complete by:  As directed             Medication List    TAKE these medications  aspirin 81 MG EC tablet  Take 1 tablet (81 mg total) by mouth daily.     atorvastatin 80 MG tablet  Commonly known as:  LIPITOR  Take 1 tablet (80 mg total) by mouth daily at 6 PM.     carvedilol 6.25 MG tablet  Commonly known as:  COREG  Take 1 tablet (6.25 mg total) by mouth 2 (two) times daily with a meal.     cyanocobalamin 500 MCG tablet  Take 1 tablet by mouth daily.     DULoxetine 60 MG capsule  Commonly known as:  CYMBALTA  Take 1 capsule by mouth daily.     nitroGLYCERIN 0.4 MG SL tablet   Commonly known as:  NITROSTAT  Place 1 tablet (0.4 mg total) under the tongue every 5 (five) minutes x 3 doses as needed for chest pain.     omeprazole 40 MG capsule  Commonly known as:  PRILOSEC  Take 1 capsule (40 mg total) by mouth daily before breakfast.     pregabalin 150 MG capsule  Commonly known as:  LYRICA  Take 1 tablet by mouth 2 (two) times daily.     ticagrelor 90 MG Tabs tablet  Commonly known as:  BRILINTA  Take 1 tablet (90 mg total) by mouth 2 (two) times daily.     traMADol 50 MG tablet  Commonly known as:  ULTRAM  Take 2 tablets by mouth 3 (three) times daily as needed.     TYLENOL ARTHRITIS PAIN PO  Take 2 tablets by mouth daily.     VITAMIN C PO  Take 1 tablet by mouth daily.           Follow-up Information    Follow up with [name], MD On 08/17/2014.   Specialty:  Cardiology   Why:  at 1:30 (please arrive at 1:00)   Contact information:   [address] [place] [address] [phone]        [name], MD, [facility] 08/10/2014, 8:09 AM [facility] Pager: [phone] Office: [phone]

## 2014-08-10 NOTE — Progress Notes (Signed)
CARDIAC REHAB PHASE I   PRE:  Rate/Rhythm: 81 SR    BP: sitting 143/69    SaO2:   MODE:  Ambulation: 1000 ft   POST:  Rate/Rhythm: 104 ST    BP: sitting 165/82     SaO2:   Tolerated very well although pt sts he still doesn't feel like his stamina is back to baseline after his MI. Gave him updated ex gl to progress with his walking at home. Pt is interested in [facility] and will send referral to [place]. Teach back review, pt very knowledgeable. Pt needs new 3 month prescription for Brilinta to get 3 months for $18.  [id number]  Darrick Meigs CES, ACSM 08/10/2014 8:30 AM

## 2014-08-10 NOTE — Discharge Instructions (Signed)
Coronary Angiogram With Stent, Care After °Refer to this sheet in the next few weeks. These instructions provide you with information on caring for yourself after your procedure. Your health care provider may also give you more specific instructions. Your treatment has been planned according to current medical practices, but problems sometimes occur. Call your health care provider if you have any problems or questions after your procedure.  °WHAT TO EXPECT AFTER THE PROCEDURE  °The insertion site may be tender for a few days after your procedure. °HOME CARE INSTRUCTIONS  °· Take medicines only as directed by your health care provider. Blood thinners may be prescribed after your procedure to improve blood flow through the stent. °· Change any bandages (dressings) as directed by your health care provider.   °· Check your insertion site every day for redness, swelling, or fluid leaking from the insertion.   °· Do not take baths, swim, or use a hot tub until your health care provider approves. You may shower. Pat the insertion area dry. Do not rub the insertion area with a washcloth or towel.   °· Eat a heart-healthy diet. This should include plenty of fresh fruits and vegetables. Meat should be lean cuts. Avoid the following types of food:   °¨ Food that is high in salt.   °¨ Canned or highly processed food.   °¨ Food that is high in saturated fat or sugar.   °¨ Fried food.   °· Make any other lifestyle changes recommended by your health care provider. This may include:   °¨ Not using any tobacco products including cigarettes, chewing tobacco, or electronic cigarettes.  °¨ Managing your weight.   °¨ Getting regular exercise.   °¨ Managing your blood pressure.   °¨ Limiting your alcohol intake.   °¨ Managing other health problems, such as diabetes.   °· If you need an MRI after your heart stent was placed, be sure to tell the health care provider who orders the MRI that you have a heart stent.   °· Keep all follow-up  visits as directed by your health care provider.   °SEEK IMMEDIATE MEDICAL CARE IF:  °· You develop chest pain, shortness of breath, feel faint, or pass out. °· You have bleeding, swelling larger than a walnut, or drainage from the catheter insertion site. °· You develop pain, discoloration, coldness, or severe bruising in the leg or arm that held the catheter. °· You develop bleeding from any other place such as from the bowels. There may be bright red blood in the urine or stools, or it may appear as black, tarry stools. °· You have a fever or chills. °MAKE SURE YOU: °· Understand these instructions. °· Will watch your condition. °· Will get help right away if you are not doing well or get worse. °Document Released: 12/21/2004 Document Revised: 10/18/2013 Document Reviewed: 11/04/2012 °ExitCare® Patient Information ©2015 [employer]. This information is not intended to replace advice given to you by your health care provider. Make sure you discuss any questions you have with your health care provider. ° °

## 2014-08-12 NOTE — H&P (Signed)
Adam Lucas is an 64 y.o. male.   Chief Complaint:  Chest pain. Scheduled for elective PCI HPI: Patient is a 64 year old Caucasian male with history of hypertension, chronic back pain, who was admitted on 07/17/2014 for STEMI and underwent successful thrombectomy of the proximal RCA and PTCA and stenting of the proximal and mid RCA with residual stenosis to the LAD, D2, and Cx. After allowing for adequate time for stabilization from STEMI, he was brought back to the cath lab on 08/09/2014 on an elective basis to reevaluate his left system. He had not exerted himself since previous discharge and has not had recurrent chest pain. He was advised not to exert as the Cx and lad disease was felt to be significant and was not well assessed during STEMI.   Past Medical History  Diagnosis Date  . Hypertension   . Coronary artery disease   . Kidney stones   . Family history of adverse reaction to anesthesia   . Heart murmur ~ 1958  . Myocardial infarction 07/17/2014  . High cholesterol   . Migraine     "once q year or 2" (08/09/2014)  . Degenerative arthritis   . Neuropathy   . Chronic lower back pain     Past Surgical History  Procedure Laterality Date  . Left heart catheterization with coronary angiogram N/A 07/17/2014    Procedure: LEFT HEART CATHETERIZATION WITH CORONARY ANGIOGRAM;  Surgeon: [name], MD;  Location[facility] CATH LAB;  Service: Cardiovascular;  Laterality: N/A;  . Coronary angioplasty with stent placement  07/17/2014; 08/09/2014    "1; 2"  . Tonsillectomy and adenoidectomy  1970'S  . Carpal tunnel release Right 2015  . Knee arthroscopy Left ~ 2002  . Back surgery    . Posterior lumbar fusion 4 level  2005    "L3, 4, 5; S1"  . Orif finger / thumb fracture Right ~ 2012  . Hand ligament reconstruction Right ~ 2013    "thumb"  . Extracorporeal shock wave lithotripsy  2000's X 1  . Fracture surgery    . Coronary angiogram  08/09/2014    Procedure: CORONARY ANGIOGRAM;   Surgeon: [name], MD;  Location[facility] CATH LAB;  Service: Cardiovascular;;  . Percutaneous coronary stent intervention (pci-s)  08/09/2014    Procedure: PERCUTANEOUS CORONARY STENT INTERVENTION (PCI-S);  Surgeon: [name], MD;  Location: [facility] CATH LAB;  Service: Cardiovascular;;  mid circ promus 4x28 mid LAD promus 2.75x12    History reviewed. No pertinent family history. Social History:  reports that he has never smoked. He has never used smokeless tobacco. He reports that he drinks alcohol. He reports that he does not use illicit drugs.  Allergies: No Known Allergies  No prescriptions prior to admission    Review of Systems - Negative except known CAD  Blood pressure 165/82, pulse 77, temperature 98 F (36.7 C), temperature source Oral, resp. rate 20, height 5\' 9"  (1.753 m), weight 93.6 kg (206 lb 5.6 oz), SpO2 99 %. General appearance: alert, cooperative, appears stated age, no distress and mildly obese Eyes: negative findings: lids and lashes normal Neck: no adenopathy, no carotid bruit, no JVD, supple, symmetrical, trachea midline and thyroid not enlarged, symmetric, no tenderness/mass/nodules Neck: JVP - normal, carotids 2+= without bruits Resp: clear to auscultation bilaterally Chest wall: no tenderness Cardio: regular rate and rhythm, S1, S2 normal, no murmur, click, rub or gallop GI: soft, non-tender; bowel sounds normal; no masses,  no organomegaly Extremities: extremities normal, atraumatic,  no cyanosis or edema Pulses: 2+ and symmetric Skin: Skin color, texture, turgor normal. No rashes or lesions Neurologic: Grossly normal  No results found for this or any previous visit (from the past 48 hour(s)). No results found.  Labs:   Lab Results  Component Value Date   WBC 7.4 08/10/2014   HGB 12.8* 08/10/2014   HCT 40.7 08/10/2014   MCV 78.1 08/10/2014   PLT 222 08/10/2014    Recent Labs Lab 08/10/14 0243  NA 138  K 4.2  CL 105  CO2 29  BUN 14   CREATININE 1.12  CALCIUM 8.8  GLUCOSE 128*   Lab Results  Component Value Date   TROPONINI 41.08* 07/18/2014    Lipid Panel     Component Value Date/Time   CHOL 171 07/17/2014 1830   TRIG 82 07/17/2014 1830   HDL 38* 07/17/2014 1830   CHOLHDL 4.5 07/17/2014 1830   VLDL 16 07/17/2014 1830   LDLCALC 117* 07/17/2014 1830    Assessment/Plan CAD with residual Cx and mid LAD disease for elective repeat coronary angiogram and possible PCI.   [name], MD 08/12/2014, 9:53 AM [facility] [place] Pager: [phone] Office: [phone] If no answer: Cell:  [phone]

## 2014-08-22 ENCOUNTER — Telehealth (HOSPITAL_COMMUNITY): Payer: Self-pay | Admitting: Cardiac Rehabilitation

## 2014-08-22 NOTE — Telephone Encounter (Signed)
Referral and order received from Dr. Einar Gip for pt to participate in cardiac rehab. Pt wife states pt prefers [facility] location.  Order forwarded to [facility] Cardiac rehab program.

## 2014-10-07 NOTE — Op Note (Signed)
PATIENT NAME:  FINES, KIMBERLIN MR#:  [id number] DATE OF BIRTH:  [birth year]  DATE OF PROCEDURE:  04/13/2013  PREOPERATIVE DIAGNOSES: 1.  Severe right carpal tunnel syndrome. 2.  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   POSTOPERATIVE DIAGNOSES:  1.  Severe right carpal tunnel syndrome. 2.  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   PROCEDURES: 1.  Right carpal tunnel release. 2.  Reconstruction ulnar collateral ligament of the metacarpophalangeal joint of the right thumb with palmaris longus tendon.   SURGEON: Park Breed, MD  ANESTHESIA: General LMA.   COMPLICATIONS: None.   DRAINS: None.   ESTIMATED BLOOD LOSS: None.   REPLACED: None.   DESCRIPTION OF PROCEDURE: The patient was brought to the operating room where he underwent satisfactory general LMA anesthesia in the supine position. The right hand and arm were prepped and draped in sterile fashion. Esmarch was applied and the tourniquet inflated to 250 mmHg. Tourniquet time was 117 minutes. A longitudinal incision was made in the palm just ulnar side of midline. Dissection was carried out bluntly through subcutaneous tissue and the distal aspect of the volar carpal ligament was identified. Kelly clamp was passed beneath this. Soft tissue was elevated off of the volar carpal ligament proximally. The volar carpal ligament was then divided with a mini blade knife distally. Carpal tunnel scissors were used to finish this under direct vision. The nerve was freed up from adhesions with a mosquito clamp. The motor branch was intact. The ulnar tunnel was unroofed as well under direct vision. The wound was then irrigated and closed with running 4-0 nylon suture.   Next, a small transverse incision was made over the insertion of the palmaris longus tendon. Two more incisions were made proximally over the tendon. The tendon was freed up  from adhesions and released distally and brought out proximally. Tendon passer was used to finish removal of the tendon. These wounds were closed with 4-0 Vicryl and 5-0 nylon suture.   Next, a longitudinal gently modified S-shaped incision was made over the dorsal and ulnar aspect of the right thumb MP joint, starting along the dorsal ulnar border of the metacarpal and extending obliquely across the joint and extending distally out along the phalanx. Dissection was carried out bluntly through subcutaneous tissue and the dorsal nerve was identified and protected. There was a lot of scar and adhesion from previous surgery. Sharp dissection was used to open up the joint on the ulnar side and a flap created based volarly. This exposed the joint and base of the proximal phalanx and the distal metacarpal. A rongeur was used to remove debris from the edges of the joint, some chondromalacia of the center of the metacarpal head from previous pin insertion. The remainder of the joint was intact. Two holes were made near the base of the proximal phalanx and enlarged. The tendon suture passer was passed through these and after enlarging the holes adequately the palmaris longus tendon was passed through these holes dorsal to volar. Another larger hole was made just beyond the metacarpal head and enlarged. The 2 arms of the tendon distally were sutured together in a whipstitch fashion. Two Lanny Hurst needles were then drilled through the larger hole near the metacarpal out to the radial side and sutures in the tendon graft were passed into the [place] needles and brought out radially after the graft had been cut to appropriate length. The joint was held  fully reduced while the graft was brought into the proximal drill hole into the metacarpal. Once this was held tightly, the sutures were tied down over bone radially securing the joint in good alignment. Next, a 0.62 K wire was then drilled across the joint to protect the repair. This  was bent over, cut and buried. The ulnar capsule was then repaired with interrupted 3-0 Vicryl sutures. The wounds were irrigated. Subcutaneous tissue was closed with 4-0 Vicryl. The skin was closed was 4-0 nylon. 0.5% Marcaine was placed in all wounds. A dry sterile compression hand dressing with thumb spica splint was applied. The tourniquet was deflated with good return of blood flow to the hand. The patient was awakened and taken to recovery in good condition. ____________________________ Park Breed, MD hem:sb D: 04/13/2013 10:17:41 ET T: 04/13/2013 10:54:04 ET JOB#: [id number]  cc: Park Breed, MD, <Dictator> Park Breed MD ELECTRONICALLY SIGNED 04/15/2013 13:06

## 2015-01-09 ENCOUNTER — Telehealth: Payer: Self-pay | Admitting: *Deleted

## 2015-01-09 NOTE — Telephone Encounter (Signed)
Called to talk with Mr. Gelles about Cardiac Rehab referral. He is interested and will check his insurance. Will call back next week.

## 2015-11-01 ENCOUNTER — Encounter: Payer: Self-pay | Admitting: *Deleted

## 2015-11-02 ENCOUNTER — Ambulatory Visit: Payer: Medicare Other | Admitting: Anesthesiology

## 2015-11-02 ENCOUNTER — Encounter: Payer: Self-pay | Admitting: Anesthesiology

## 2015-11-02 ENCOUNTER — Ambulatory Visit
Admission: RE | Admit: 2015-11-02 | Discharge: 2015-11-02 | Disposition: A | Discharge status: Home / Self Care | Payer: Medicare Other | Source: Ambulatory Visit | Attending: Unknown Physician Specialty | Admitting: Unknown Physician Specialty

## 2015-11-02 ENCOUNTER — Encounter
Admission: RE | Disposition: A | Discharge status: Home / Self Care | Payer: Self-pay | Source: Ambulatory Visit | Attending: Unknown Physician Specialty

## 2015-11-02 DIAGNOSIS — D123 Benign neoplasm of transverse colon: Secondary | ICD-10-CM | POA: Insufficient documentation

## 2015-11-02 DIAGNOSIS — D122 Benign neoplasm of ascending colon: Secondary | ICD-10-CM | POA: Insufficient documentation

## 2015-11-02 DIAGNOSIS — G8929 Other chronic pain: Secondary | ICD-10-CM | POA: Diagnosis not present

## 2015-11-02 DIAGNOSIS — M1990 Unspecified osteoarthritis, unspecified site: Secondary | ICD-10-CM | POA: Diagnosis not present

## 2015-11-02 DIAGNOSIS — I2510 Atherosclerotic heart disease of native coronary artery without angina pectoris: Secondary | ICD-10-CM | POA: Diagnosis not present

## 2015-11-02 DIAGNOSIS — K640 First degree hemorrhoids: Secondary | ICD-10-CM | POA: Insufficient documentation

## 2015-11-02 DIAGNOSIS — Z955 Presence of coronary angioplasty implant and graft: Secondary | ICD-10-CM | POA: Insufficient documentation

## 2015-11-02 DIAGNOSIS — M545 Low back pain: Secondary | ICD-10-CM | POA: Insufficient documentation

## 2015-11-02 DIAGNOSIS — Z7982 Long term (current) use of aspirin: Secondary | ICD-10-CM | POA: Diagnosis not present

## 2015-11-02 DIAGNOSIS — I10 Essential (primary) hypertension: Secondary | ICD-10-CM | POA: Insufficient documentation

## 2015-11-02 DIAGNOSIS — G629 Polyneuropathy, unspecified: Secondary | ICD-10-CM | POA: Insufficient documentation

## 2015-11-02 DIAGNOSIS — Z86010 Personal history of colonic polyps: Secondary | ICD-10-CM | POA: Diagnosis present

## 2015-11-02 DIAGNOSIS — I252 Old myocardial infarction: Secondary | ICD-10-CM | POA: Diagnosis not present

## 2015-11-02 DIAGNOSIS — Z79899 Other long term (current) drug therapy: Secondary | ICD-10-CM | POA: Insufficient documentation

## 2015-11-02 DIAGNOSIS — Z1211 Encounter for screening for malignant neoplasm of colon: Secondary | ICD-10-CM | POA: Diagnosis not present

## 2015-11-02 HISTORY — PX: COLONOSCOPY WITH PROPOFOL: SHX5780

## 2015-11-02 SURGERY — COLONOSCOPY WITH PROPOFOL
Anesthesia: General

## 2015-11-02 MED ORDER — MIDAZOLAM HCL 5 MG/5ML IJ SOLN
INTRAMUSCULAR | Status: DC | PRN
  Administered 2015-11-02: 1 mg via INTRAVENOUS

## 2015-11-02 MED ORDER — LIDOCAINE HCL (PF) 1 % IJ SOLN
2.0000 mL | Freq: Once | INTRAMUSCULAR | Status: AC
  Administered 2015-11-02: 0.03 mL via INTRADERMAL

## 2015-11-02 MED ORDER — SODIUM CHLORIDE 0.9 % IV SOLN
INTRAVENOUS | Status: DC
  Administered 2015-11-02: 1000 mL via INTRAVENOUS

## 2015-11-02 MED ORDER — PROPOFOL 500 MG/50ML IV EMUL
INTRAVENOUS | Status: DC | PRN
  Administered 2015-11-02: 75 ug/kg/min via INTRAVENOUS

## 2015-11-02 MED ORDER — PROPOFOL 10 MG/ML IV BOLUS
INTRAVENOUS | Status: DC | PRN
  Administered 2015-11-02: 30 mg via INTRAVENOUS
  Administered 2015-11-02: 20 mg via INTRAVENOUS

## 2015-11-02 MED ORDER — SODIUM CHLORIDE 0.9 % IV SOLN: INTRAVENOUS | Status: DC

## 2015-11-02 MED ORDER — LIDOCAINE HCL (PF) 2 % IJ SOLN
INTRAMUSCULAR | Status: DC | PRN
Start: 2015-11-02 — End: 2015-11-02
  Administered 2015-11-02: 50 mg

## 2015-11-02 MED ORDER — LIDOCAINE HCL (PF) 1 % IJ SOLN
INTRAMUSCULAR | Status: AC
  Administered 2015-11-02: 0.03 mL via INTRADERMAL
  Filled 2015-11-02: qty 2

## 2015-11-02 MED ORDER — FENTANYL CITRATE (PF) 100 MCG/2ML IJ SOLN
INTRAMUSCULAR | Status: DC | PRN
  Administered 2015-11-02: 50 ug via INTRAVENOUS

## 2015-11-02 NOTE — Op Note (Signed)
[facility] Gastroenterology Patient Name: Adam Lucas Procedure Date: 11/02/2015 12:31 PM MRN: [id number] Account #: [id number] Date of Birth: [birth year] Admit Type: Outpatient Age: 65 Room: [facility] ENDO ROOM 1 Gender: Male Note Status: Finalized Procedure:            Colonoscopy Indications:          High risk colon cancer surveillance: Personal history                        of colonic polyps Providers:            Manya Silvas, MD Referring MD:         Ocie Cornfield. Ouida Sills, MD (Referring MD) Medicines:            Propofol per Anesthesia Complications:        No immediate complications. Procedure:            Pre-Anesthesia Assessment:                       - After reviewing the risks and benefits, the patient                        was deemed in satisfactory condition to undergo the                        procedure.                       After obtaining informed consent, the colonoscope was                        passed under direct vision. Throughout the procedure,                        the patient's blood pressure, pulse, and oxygen                        saturations were monitored continuously. The                        Colonoscope was introduced through the anus and                        advanced to the the cecum, identified by appendiceal                        orifice and ileocecal valve. The colonoscopy was                        performed without difficulty. The patient tolerated the                        procedure well. The quality of the bowel preparation                        was excellent. Findings:      Two sessile polyps were found in the splenic flexure and ascending       colon. The polyps were diminutive in size. These polyps were removed       with a hot snare. Resection and retrieval were complete.  Internal hemorrhoids were found during endoscopy. The hemorrhoids were       small and Grade I (internal hemorrhoids that do not  prolapse).      The exam was otherwise without abnormality. Impression:           - Two diminutive polyps at the splenic flexure and in                        the ascending colon, removed with a hot snare. Resected                        and retrieved.                       - Internal hemorrhoids.                       - The examination was otherwise normal. Recommendation:       - Await pathology results. Manya Silvas, MD 11/02/2015 1:14:15 PM This report has been signed electronically. Number of Addenda: 0 Note Initiated On: 11/02/2015 12:31 PM Scope Withdrawal Time: 0 hours 11 minutes 39 seconds  Total Procedure Duration: 0 hours 23 minutes 18 seconds       [facility]

## 2015-11-02 NOTE — Anesthesia Preprocedure Evaluation (Signed)
Anesthesia Evaluation  Patient identified by MRN, date of birth, ID band Patient awake    Reviewed: Allergy & Precautions, H&P , NPO status , Patient's Chart, lab work & pertinent test results  History of Anesthesia Complications (+) Family history of anesthesia reaction and history of anesthetic complications  Airway Mallampati: I  TM Distance: >3 FB Neck ROM: limited    Dental  (+) Poor Dentition, Chipped   Pulmonary neg pulmonary ROS, neg shortness of breath,    Pulmonary exam normal breath sounds clear to auscultation       Cardiovascular Exercise Tolerance: Good hypertension, (-) angina+ CAD, + Past MI and + Cardiac Stents  (-) DOE Normal cardiovascular exam Rhythm:regular Rate:Normal     Neuro/Psych  Headaches, negative psych ROS   GI/Hepatic negative GI ROS, Neg liver ROS,   Endo/Other  negative endocrine ROS  Renal/GU Renal disease  negative genitourinary   Musculoskeletal  (+) Arthritis ,   Abdominal   Peds  Hematology negative hematology ROS (+)   Anesthesia Other Findings Past Medical History:   Hypertension                                                 Coronary artery disease                                      Kidney stones                                                Family history of adverse reaction to anesthes*              Heart murmur                                    ~ 1958       Myocardial infarction ([place])                     07/17/2014    High cholesterol                                             Migraine                                                       Comment:"once q year or 2" (08/09/2014)   Degenerative arthritis                                       Neuropathy ([place])  Chronic lower back pain                                     Past Surgical History:   LEFT HEART CATHETERIZATION WITH CORONARY ANGIO* N/A 07/17/2014   Comment:Procedure: LEFT HEART CATHETERIZATION WITH               CORONARY ANGIOGRAM;  Surgeon: [name], MD;  Location: [facility] CATH LAB;  Service:               Cardiovascular;  Laterality: N/A;   CORONARY ANGIOPLASTY WITH STENT PLACEMENT        07/17/2014*     Comment:"1; 2"   TONSILLECTOMY AND ADENOIDECTOMY                  1970'S       CARPAL TUNNEL RELEASE                           Right 2015         KNEE ARTHROSCOPY                                Left ~ 2002       BACK SURGERY                                                  POSTERIOR LUMBAR FUSION 4 LEVEL                  2005           Comment:"L3, 4, 5; S1"   ORIF FINGER / THUMB FRACTURE                    Right ~ 2012       HAND LIGAMENT RECONSTRUCTION                    Right ~ 2013         Comment:"thumb"   EXTRACORPOREAL SHOCK WAVE LITHOTRIPSY            2000's X 1   FRACTURE SURGERY                                              CORONARY ANGIOGRAM                               08/09/2014      Comment:Procedure: CORONARY ANGIOGRAM;  Surgeon:               [name], MD;  Location: [facility] CATH LAB;               Service: Cardiovascular;;   PERCUTANEOUS CORONARY STENT INTERVENTION (PCI-*  08/09/2014      Comment:Procedure: PERCUTANEOUS CORONARY STENT               INTERVENTION (PCI-S);  Surgeon: Turner Daniels  Einar Gip, MD;  Location: [facility] CATH LAB;  Service:               Cardiovascular;;  mid circ promus 4x28 mid LAD              promus 2.75x12  BMI    Body Mass Index   29.52 kg/m 2    Patient has cardiac clearance for this procedure.    Reproductive/Obstetrics negative OB ROS                             Anesthesia Physical Anesthesia Plan  ASA: III  Anesthesia Plan: General   Post-op Pain Management:    Induction:   Airway Management Planned:   Additional Equipment:   Intra-op Plan:   Post-operative Plan:   Informed Consent: I have reviewed the  patients History and Physical, chart, labs and discussed the procedure including the risks, benefits and alternatives for the proposed anesthesia with the patient or authorized representative who has indicated his/her understanding and acceptance.   Dental Advisory Given  Plan Discussed with: Anesthesiologist, CRNA and Surgeon  Anesthesia Plan Comments:         Anesthesia Quick Evaluation

## 2015-11-02 NOTE — Transfer of Care (Signed)
2Immediate Anesthesia Transfer of Care Note  Patient: Adam Lucas  Procedure(s) Performed: Procedure(s): COLONOSCOPY WITH PROPOFOL (N/A)  Patient Location: PACU  Anesthesia Type:General  Level of Consciousness: sedated  Airway & Oxygen Therapy: Patient Spontanous Breathing and Patient connected to nasal cannula oxygen  Post-op Assessment: Report given to RN and Post -op Vital signs reviewed and stable  Post vital signs: Reviewed and stable  Last Vitals:  Filed Vitals:   11/02/15 1214  BP: 119/86  Pulse: 86  Temp: 36 C  Resp: 22    Last Pain: There were no vitals filed for this visit.       Complications: No apparent anesthesia complications

## 2015-11-02 NOTE — H&P (Signed)
Primary Care Physician:  Kirk Ruths., MD Primary Gastroenterologist:  Dr. Vira Agar  Pre-Procedure History & Physical: HPI:  Adam Lucas is a 65 y.o. male is here for an colonoscopy.   Past Medical History  Diagnosis Date  . Hypertension   . Coronary artery disease   . Kidney stones   . Family history of adverse reaction to anesthesia   . Heart murmur ~ 1958  . Myocardial infarction ([place]) 07/17/2014  . High cholesterol   . Migraine     "once q year or 2" (08/09/2014)  . Degenerative arthritis   . Neuropathy ([place])   . Chronic lower back pain     Past Surgical History  Procedure Laterality Date  . Left heart catheterization with coronary angiogram N/A 07/17/2014    Procedure: LEFT HEART CATHETERIZATION WITH CORONARY ANGIOGRAM;  Surgeon: [name], MD;  Location: [facility] CATH LAB;  Service: Cardiovascular;  Laterality: N/A;  . Coronary angioplasty with stent placement  07/17/2014; 08/09/2014    "1; 2"  . Tonsillectomy and adenoidectomy  1970'S  . Carpal tunnel release Right 2015  . Knee arthroscopy Left ~ 2002  . Back surgery    . Posterior lumbar fusion 4 level  2005    "L3, 4, 5; S1"  . Orif finger / thumb fracture Right ~ 2012  . Hand ligament reconstruction Right ~ 2013    "thumb"  . Extracorporeal shock wave lithotripsy  2000's X 1  . Fracture surgery    . Coronary angiogram  08/09/2014    Procedure: CORONARY ANGIOGRAM;  Surgeon: [name], MD;  Location: [facility] CATH LAB;  Service: Cardiovascular;;  . Percutaneous coronary stent intervention (pci-s)  08/09/2014    Procedure: PERCUTANEOUS CORONARY STENT INTERVENTION (PCI-S);  Surgeon: [name], MD;  Location: [facility] CATH LAB;  Service: Cardiovascular;;  mid circ promus 4x28 mid LAD promus 2.75x12    Prior to Admission medications   Medication Sig Start Date End Date Taking? Authorizing Provider  Acetaminophen (TYLENOL ARTHRITIS PAIN PO) Take 2 tablets by mouth daily.    Historical Provider, MD   Ascorbic Acid (VITAMIN C PO) Take 1 tablet by mouth daily.    Historical Provider, MD  aspirin EC 81 MG EC tablet Take 1 tablet (81 mg total) by mouth daily. 07/18/14   [name], MD  atorvastatin (LIPITOR) 80 MG tablet Take 1 tablet (80 mg total) by mouth daily at 6 PM. 07/18/14   [name], MD  carvedilol (COREG) 6.25 MG tablet Take 1 tablet (6.25 mg total) by mouth 2 (two) times daily with a meal. Patient taking differently: Take 3.125 mg by mouth 2 (two) times daily with a meal.  07/18/14   [name], MD  cyanocobalamin 500 MCG tablet Take 1 tablet by mouth daily.    Historical Provider, MD  DULoxetine (CYMBALTA) 60 MG capsule Take 1 capsule by mouth daily. 06/06/14   Historical Provider, MD  nitroGLYCERIN (NITROSTAT) 0.4 MG SL tablet Place 1 tablet (0.4 mg total) under the tongue every 5 (five) minutes x 3 doses as needed for chest pain. 07/18/14   [name], MD  omeprazole (PRILOSEC) 40 MG capsule Take 1 capsule (40 mg total) by mouth daily before breakfast. 07/18/14   [name], MD  pregabalin (LYRICA) 150 MG capsule Take 1 tablet by mouth 2 (two) times daily. 07/04/14   Historical Provider, MD  ticagrelor (BRILINTA) 90 MG TABS tablet Take 1 tablet (90 mg total) by mouth 2 (two) times daily. 07/18/14  [name], MD  traMADol (ULTRAM) 50 MG tablet Take 2 tablets by mouth 3 (three) times daily as needed. 06/27/14   Historical Provider, MD    Allergies as of 10/26/2015  . (No Known Allergies)    History reviewed. No pertinent family history.  Social History   Social History  . Marital Status: Married    Spouse Name: N/A  . Number of Children: N/A  . Years of Education: N/A   Occupational History  . Not on file.   Social History Main Topics  . Smoking status: Never Smoker   . Smokeless tobacco: Never Used  . Alcohol Use: Yes     Comment: 08/09/2014 "I'll have a drink 1-2 times year"  . Drug Use: No  . Sexual Activity: No   Other Topics Concern  . Not on file   Social History  Narrative    Review of Systems: See HPI, otherwise negative ROS  Physical Exam: BP 119/86 mmHg  Pulse 86  Temp(Src) 96.8 F (36 C) (Tympanic)  Resp 22  Ht 5\' 9"  (1.753 m)  Wt 90.719 kg (200 lb)  BMI 29.52 kg/m2  SpO2 98% General:   Alert,  pleasant and cooperative in NAD Head:  Normocephalic and atraumatic. Neck:  Supple; no masses or thyromegaly. Lungs:  Clear throughout to auscultation.    Heart:  Regular rate and rhythm. Abdomen:  Soft, nontender and nondistended. Normal bowel sounds, without guarding, and without rebound.   Neurologic:  Alert and  oriented x4;  grossly normal neurologically.  Impression/Plan: RALIEGH KARPF is here for an colonoscopy to be performed for [facility] colon polyps  Risks, benefits, limitations, and alternatives regarding  colonoscopy have been reviewed with the patient.  Questions have been answered.  All parties agreeable.   Gaylyn Cheers, MD  11/02/2015, 12:43 PM

## 2015-11-03 LAB — SURGICAL PATHOLOGY

## 2015-11-03 NOTE — Anesthesia Postprocedure Evaluation (Signed)
Anesthesia Post Note  Patient: Adam Lucas  Procedure(s) Performed: Procedure(s) (LRB): COLONOSCOPY WITH PROPOFOL (N/A)  Patient location during evaluation: Endoscopy Anesthesia Type: General Level of consciousness: awake and alert Pain management: pain level controlled Vital Signs Assessment: post-procedure vital signs reviewed and stable Respiratory status: spontaneous breathing, nonlabored ventilation, respiratory function stable and patient connected to nasal cannula oxygen Cardiovascular status: blood pressure returned to baseline and stable Postop Assessment: no signs of nausea or vomiting Anesthetic complications: no    Last Vitals:  Filed Vitals:   11/02/15 1335 11/02/15 1345  BP: 145/94 153/89  Pulse: 69 69  Temp:    Resp: 11 13    Last Pain: There were no vitals filed for this visit.               Precious Haws Marely Apgar

## 2015-11-07 ENCOUNTER — Encounter: Payer: Self-pay | Admitting: Unknown Physician Specialty

## 2017-01-28 ENCOUNTER — Other Ambulatory Visit: Payer: Self-pay | Admitting: Internal Medicine

## 2017-01-28 DIAGNOSIS — G8929 Other chronic pain: Secondary | ICD-10-CM

## 2017-01-28 DIAGNOSIS — M5442 Lumbago with sciatica, left side: Principal | ICD-10-CM

## 2017-01-28 DIAGNOSIS — Z9889 Other specified postprocedural states: Secondary | ICD-10-CM

## 2017-02-04 ENCOUNTER — Ambulatory Visit: Payer: Medicare Other

## 2017-04-29 ENCOUNTER — Other Ambulatory Visit: Payer: Self-pay | Admitting: Physical Medicine and Rehabilitation

## 2017-04-29 DIAGNOSIS — M5416 Radiculopathy, lumbar region: Secondary | ICD-10-CM

## 2017-05-05 ENCOUNTER — Ambulatory Visit
Admission: RE | Admit: 2017-05-05 | Discharge: 2017-05-05 | Disposition: A | Discharge status: Home / Self Care | Payer: Medicare Other | Source: Ambulatory Visit | Attending: Physical Medicine and Rehabilitation | Admitting: Physical Medicine and Rehabilitation

## 2017-05-05 DIAGNOSIS — M5136 Other intervertebral disc degeneration, lumbar region: Secondary | ICD-10-CM | POA: Diagnosis present

## 2017-05-05 DIAGNOSIS — M5116 Intervertebral disc disorders with radiculopathy, lumbar region: Secondary | ICD-10-CM | POA: Diagnosis not present

## 2017-05-05 DIAGNOSIS — M5416 Radiculopathy, lumbar region: Secondary | ICD-10-CM | POA: Diagnosis present

## 2017-05-05 DIAGNOSIS — M5124 Other intervertebral disc displacement, thoracic region: Secondary | ICD-10-CM | POA: Diagnosis not present

## 2017-08-21 ENCOUNTER — Ambulatory Visit: Payer: Medicare Other | Attending: Internal Medicine

## 2017-08-21 DIAGNOSIS — G4761 Periodic limb movement disorder: Secondary | ICD-10-CM | POA: Diagnosis not present

## 2017-08-21 DIAGNOSIS — I10 Essential (primary) hypertension: Secondary | ICD-10-CM | POA: Diagnosis present

## 2017-08-21 DIAGNOSIS — G4733 Obstructive sleep apnea (adult) (pediatric): Secondary | ICD-10-CM | POA: Insufficient documentation

## 2017-08-21 DIAGNOSIS — G4710 Hypersomnia, unspecified: Secondary | ICD-10-CM | POA: Diagnosis present

## 2017-10-08 ENCOUNTER — Other Ambulatory Visit: Payer: Self-pay

## 2017-10-08 ENCOUNTER — Encounter
Admission: RE | Admit: 2017-10-08 | Discharge: 2017-10-08 | Disposition: A | Discharge status: Home / Self Care | Payer: Medicare Other | Source: Ambulatory Visit | Attending: Orthopedic Surgery | Admitting: Orthopedic Surgery

## 2017-10-08 DIAGNOSIS — E7800 Pure hypercholesterolemia, unspecified: Secondary | ICD-10-CM | POA: Diagnosis not present

## 2017-10-08 DIAGNOSIS — I252 Old myocardial infarction: Secondary | ICD-10-CM | POA: Diagnosis not present

## 2017-10-08 DIAGNOSIS — Z01818 Encounter for other preprocedural examination: Secondary | ICD-10-CM | POA: Insufficient documentation

## 2017-10-08 DIAGNOSIS — I10 Essential (primary) hypertension: Secondary | ICD-10-CM | POA: Diagnosis not present

## 2017-10-08 DIAGNOSIS — I2510 Atherosclerotic heart disease of native coronary artery without angina pectoris: Secondary | ICD-10-CM | POA: Diagnosis not present

## 2017-10-08 DIAGNOSIS — Z79899 Other long term (current) drug therapy: Secondary | ICD-10-CM | POA: Diagnosis not present

## 2017-10-08 DIAGNOSIS — Z7982 Long term (current) use of aspirin: Secondary | ICD-10-CM | POA: Diagnosis not present

## 2017-10-08 DIAGNOSIS — M1612 Unilateral primary osteoarthritis, left hip: Secondary | ICD-10-CM | POA: Insufficient documentation

## 2017-10-08 HISTORY — DX: Personal history of urinary calculi: Z87.442

## 2017-10-08 LAB — CBC WITH DIFFERENTIAL/PLATELET
BASOS ABS: 0 10*3/uL (ref 0–0.1)
Basophils Relative: 0 %
EOS PCT: 0 %
Eosinophils Absolute: 0 10*3/uL (ref 0–0.7)
HEMATOCRIT: 48.3 % (ref 40.0–52.0)
HEMOGLOBIN: 15.6 g/dL (ref 13.0–18.0)
LYMPHS ABS: 2 10*3/uL (ref 1.0–3.6)
LYMPHS PCT: 14 %
MCH: 26.1 pg (ref 26.0–34.0)
MCHC: 32.4 g/dL (ref 32.0–36.0)
MCV: 80.7 fL (ref 80.0–100.0)
Monocytes Absolute: 1.4 10*3/uL — ABNORMAL HIGH (ref 0.2–1.0)
Monocytes Relative: 10 %
NEUTROS ABS: 10.3 10*3/uL — AB (ref 1.4–6.5)
Neutrophils Relative %: 76 %
PLATELETS: 237 10*3/uL (ref 150–440)
RBC: 5.99 MIL/uL — AB (ref 4.40–5.90)
RDW: 16.3 % — ABNORMAL HIGH (ref 11.5–14.5)
WBC: 13.7 10*3/uL — ABNORMAL HIGH (ref 3.8–10.6)

## 2017-10-08 LAB — COMPREHENSIVE METABOLIC PANEL
ALK PHOS: 62 U/L (ref 38–126)
ALT: 19 U/L (ref 17–63)
AST: 21 U/L (ref 15–41)
Albumin: 4.5 g/dL (ref 3.5–5.0)
Anion gap: 7 (ref 5–15)
BILIRUBIN TOTAL: 0.4 mg/dL (ref 0.3–1.2)
BUN: 21 mg/dL — AB (ref 6–20)
CALCIUM: 9.2 mg/dL (ref 8.9–10.3)
CHLORIDE: 104 mmol/L (ref 101–111)
CO2: 25 mmol/L (ref 22–32)
CREATININE: 1.05 mg/dL (ref 0.61–1.24)
GFR calc Af Amer: >60 mL/min (ref >60)
Glucose, Bld: 101 mg/dL — ABNORMAL HIGH (ref 65–99)
Potassium: 3.7 mmol/L (ref 3.5–5.1)
Sodium: 136 mmol/L (ref 135–145)
TOTAL PROTEIN: 7.8 g/dL (ref 6.5–8.1)

## 2017-10-08 LAB — URINALYSIS, ROUTINE W REFLEX MICROSCOPIC
BILIRUBIN URINE: NEGATIVE
Bacteria, UA: NONE SEEN
Glucose, UA: NEGATIVE mg/dL
HGB URINE DIPSTICK: NEGATIVE
KETONES UR: NEGATIVE mg/dL
NITRITE: NEGATIVE
Protein, ur: NEGATIVE mg/dL
Specific Gravity, Urine: 1.018 (ref 1.005–1.030)
Squamous Epithelial / LPF: NONE SEEN (ref 0–5)
pH: 5 (ref 5.0–8.0)

## 2017-10-08 LAB — SEDIMENTATION RATE: Sed Rate: 1 mm/hr (ref 0–20)

## 2017-10-08 LAB — TYPE AND SCREEN
ABO/RH(D): A POS
Antibody Screen: NEGATIVE

## 2017-10-08 LAB — PROTIME-INR
INR: 0.95
PROTHROMBIN TIME: 12.6 s (ref 11.4–15.2)

## 2017-10-08 LAB — C-REACTIVE PROTEIN: CRP: <0.8 mg/dL (ref <1.0)

## 2017-10-08 LAB — APTT: APTT: 28 s (ref 24–36)

## 2017-10-08 LAB — SURGICAL PCR SCREEN
MRSA, PCR: NEGATIVE
Staphylococcus aureus: NEGATIVE

## 2017-10-08 NOTE — Patient Instructions (Signed)
Your procedure is scheduled on: 10/20/17 Report to Day Surgery. Medical mall second floor To find out your arrival time please call [phone] between 1PM - 3PM on 10/17/17  Remember: Instructions that are not followed completely may result in serious medical risk, up to and including death, or upon the discretion of your surgeon and anesthesiologist your surgery may need to be rescheduled.     _X__ 1. Do not eat food after midnight the night before your procedure.                 No gum chewing or hard candies. You may drink clear liquids up to 2 hours                 before you are scheduled to arrive for your surgery- DO not drink clear                 liquids within 2 hours of the start of your surgery.                 Clear Liquids include:  water, apple juice without pulp, clear carbohydrate                 drink such as Clearfast of Gartorade, Black Coffee or Tea (Do not add                 anything to coffee or tea).  __X__2.  On the morning of surgery brush your teeth with toothpaste and water, you                 may rinse your mouth with mouthwash if you wish.  Do not swallow any              toothpaste of mouthwash.     _X__ 3.  No Alcohol for 24 hours before or after surgery.   _X__ 4.  Do Not Smoke or use e-cigarettes For 24 Hours Prior to Your Surgery.                 Do not use any chewable tobacco products for at least 6 hours prior to                 surgery.  ____  5.  Bring all medications with you on the day of surgery if instructed.   __X__  6.  Notify your doctor if there is any change in your medical condition      (cold, fever, infections).     Do not wear jewelry, make-up, hairpins, clips or nail polish. Do not wear lotions, powders, or perfumes. You may wear deodorant. Do not shave 48 hours prior to surgery. Men may shave face and neck. Do not bring valuables to the hospital.    [facility] is not responsible for any belongings or  valuables.  Contacts, dentures or bridgework may not be worn into surgery. Leave your suitcase in the car. After surgery it may be brought to your room. For patients admitted to the hospital, discharge time is determined by your treatment team.   Patients discharged the day of surgery will not be allowed to drive home.   Please read over the following fact sheets that you were given:   MRSA Information/ CHG/SPIROMETRY   __X__ Take these medicines the morning of surgery with A SIP OF WATER:    1. CARVEDILOL  2. DULOXETINE  3. GABAPENTIN   4. TRAMADOL  5.  6.  ____  Fleet Enema (as directed)   _X___ Use CHG Soap as directed  ____ Use inhalers on the day of surgery  ____ Stop metformin 2 days prior to surgery    ____ Take 1/2 of usual insulin dose the night before surgery. No insulin the morning          of surgery.   __X__ Stop Coumadin/Plavix/aspirin on   STOP ASPIRIN 7 DAYS BEFORE SURGERY  ____ Stop Anti-inflammatories on    __X__ Stop supplements until after surgery.    STOP TUMERIC  ____ Bring C-Pap to the hospital.

## 2017-10-09 LAB — URINE CULTURE
Culture: NO GROWTH
Special Requests: NORMAL

## 2017-10-19 MED ORDER — TRANEXAMIC ACID 1000 MG/10ML IV SOLN
1000.0000 mg | INTRAVENOUS | Status: AC
  Administered 2017-10-20: 1000 mg via INTRAVENOUS
  Filled 2017-10-19: qty 10

## 2017-10-19 MED ORDER — CEFAZOLIN SODIUM-DEXTROSE 2-4 GM/100ML-% IV SOLN
2.0000 g | INTRAVENOUS | Status: AC
  Administered 2017-10-20: 2 g via INTRAVENOUS

## 2017-10-20 ENCOUNTER — Inpatient Hospital Stay: Payer: Medicare Other

## 2017-10-20 ENCOUNTER — Inpatient Hospital Stay: Payer: Medicare Other | Admitting: Certified Registered Nurse Anesthetist

## 2017-10-20 ENCOUNTER — Inpatient Hospital Stay
Admission: RE | Admit: 2017-10-20 | Discharge: 2017-10-22 | DRG: 470 | Disposition: A | Discharge status: Home Health | Payer: Medicare Other | Source: Ambulatory Visit | Attending: Orthopedic Surgery | Admitting: Orthopedic Surgery

## 2017-10-20 ENCOUNTER — Other Ambulatory Visit: Payer: Self-pay

## 2017-10-20 ENCOUNTER — Encounter
Admission: RE | Disposition: A | Discharge status: Home Health | Payer: Self-pay | Source: Ambulatory Visit | Attending: Orthopedic Surgery

## 2017-10-20 ENCOUNTER — Encounter: Payer: Self-pay | Admitting: Orthopedic Surgery

## 2017-10-20 DIAGNOSIS — Z981 Arthrodesis status: Secondary | ICD-10-CM

## 2017-10-20 DIAGNOSIS — Z79899 Other long term (current) drug therapy: Secondary | ICD-10-CM

## 2017-10-20 DIAGNOSIS — Z955 Presence of coronary angioplasty implant and graft: Secondary | ICD-10-CM

## 2017-10-20 DIAGNOSIS — Z888 Allergy status to other drugs, medicaments and biological substances status: Secondary | ICD-10-CM

## 2017-10-20 DIAGNOSIS — I10 Essential (primary) hypertension: Secondary | ICD-10-CM | POA: Diagnosis present

## 2017-10-20 DIAGNOSIS — G629 Polyneuropathy, unspecified: Secondary | ICD-10-CM | POA: Diagnosis present

## 2017-10-20 DIAGNOSIS — I252 Old myocardial infarction: Secondary | ICD-10-CM | POA: Diagnosis not present

## 2017-10-20 DIAGNOSIS — I2510 Atherosclerotic heart disease of native coronary artery without angina pectoris: Secondary | ICD-10-CM | POA: Diagnosis present

## 2017-10-20 DIAGNOSIS — M1612 Unilateral primary osteoarthritis, left hip: Secondary | ICD-10-CM | POA: Diagnosis present

## 2017-10-20 DIAGNOSIS — E7800 Pure hypercholesterolemia, unspecified: Secondary | ICD-10-CM | POA: Diagnosis present

## 2017-10-20 DIAGNOSIS — Z96649 Presence of unspecified artificial hip joint: Secondary | ICD-10-CM

## 2017-10-20 DIAGNOSIS — G5600 Carpal tunnel syndrome, unspecified upper limb: Secondary | ICD-10-CM | POA: Insufficient documentation

## 2017-10-20 HISTORY — PX: TOTAL HIP ARTHROPLASTY: SHX124

## 2017-10-20 LAB — ABO/RH: ABO/RH(D): A POS

## 2017-10-20 SURGERY — ARTHROPLASTY, HIP, TOTAL,POSTERIOR APPROACH
Anesthesia: Spinal | Laterality: Left

## 2017-10-20 MED ORDER — ALUM & MAG HYDROXIDE-SIMETH 200-200-20 MG/5ML PO SUSP: 30.0000 mL | ORAL | Status: DC | PRN

## 2017-10-20 MED ORDER — PROPOFOL 10 MG/ML IV BOLUS
INTRAVENOUS | Status: DC | PRN
  Administered 2017-10-20: 35 mg via INTRAVENOUS

## 2017-10-20 MED ORDER — CELECOXIB 200 MG PO CAPS
ORAL_CAPSULE | ORAL | Status: AC
  Administered 2017-10-20: 400 mg via ORAL
  Filled 2017-10-20: qty 2

## 2017-10-20 MED ORDER — SODIUM CHLORIDE 0.9 % IV SOLN
INTRAVENOUS | Status: DC | PRN
  Administered 2017-10-20: 30 ug/min via INTRAVENOUS

## 2017-10-20 MED ORDER — METOCLOPRAMIDE HCL 5 MG/ML IJ SOLN: 5.0000 mg | Freq: Three times a day (TID) | INTRAMUSCULAR | Status: DC | PRN

## 2017-10-20 MED ORDER — ACETAMINOPHEN 10 MG/ML IV SOLN
1000.0000 mg | Freq: Four times a day (QID) | INTRAVENOUS | Status: AC
  Administered 2017-10-20 – 2017-10-21 (×4): 1000 mg via INTRAVENOUS
  Filled 2017-10-20 (×6): qty 100

## 2017-10-20 MED ORDER — CELECOXIB 200 MG PO CAPS
400.0000 mg | ORAL_CAPSULE | Freq: Once | ORAL | Status: AC
  Administered 2017-10-20: 400 mg via ORAL

## 2017-10-20 MED ORDER — FENTANYL CITRATE (PF) 100 MCG/2ML IJ SOLN: 25.0000 ug | INTRAMUSCULAR | Status: DC | PRN

## 2017-10-20 MED ORDER — BUPIVACAINE HCL (PF) 0.5 % IJ SOLN
INTRAMUSCULAR | Status: DC | PRN
  Administered 2017-10-20: 3 mL

## 2017-10-20 MED ORDER — DEXAMETHASONE SODIUM PHOSPHATE 10 MG/ML IJ SOLN
8.0000 mg | Freq: Once | INTRAMUSCULAR | Status: AC
  Administered 2017-10-20: 8 mg via INTRAVENOUS

## 2017-10-20 MED ORDER — PROPOFOL 10 MG/ML IV BOLUS
INTRAVENOUS | Status: AC
  Filled 2017-10-20: qty 20

## 2017-10-20 MED ORDER — SENNOSIDES-DOCUSATE SODIUM 8.6-50 MG PO TABS
1.0000 | ORAL_TABLET | Freq: Two times a day (BID) | ORAL | Status: DC
  Administered 2017-10-20 – 2017-10-21 (×3): 1 via ORAL
  Filled 2017-10-20 (×4): qty 1

## 2017-10-20 MED ORDER — PHENYLEPHRINE HCL 10 MG/ML IJ SOLN
INTRAMUSCULAR | Status: AC
  Filled 2017-10-20: qty 1

## 2017-10-20 MED ORDER — ONDANSETRON HCL 4 MG PO TABS: 4.0000 mg | ORAL_TABLET | Freq: Four times a day (QID) | ORAL | Status: DC | PRN

## 2017-10-20 MED ORDER — PANTOPRAZOLE SODIUM 40 MG PO TBEC
40.0000 mg | DELAYED_RELEASE_TABLET | Freq: Two times a day (BID) | ORAL | Status: DC
  Administered 2017-10-20 – 2017-10-22 (×4): 40 mg via ORAL
  Filled 2017-10-20 (×4): qty 1

## 2017-10-20 MED ORDER — FENTANYL CITRATE (PF) 100 MCG/2ML IJ SOLN
INTRAMUSCULAR | Status: AC
  Filled 2017-10-20: qty 2

## 2017-10-20 MED ORDER — MAGNESIUM GLUCONATE 500 MG PO TABS
500.0000 mg | ORAL_TABLET | Freq: Two times a day (BID) | ORAL | Status: DC
  Administered 2017-10-20 – 2017-10-22 (×4): 500 mg via ORAL
  Filled 2017-10-20 (×5): qty 1

## 2017-10-20 MED ORDER — MENTHOL 3 MG MT LOZG
1.0000 | LOZENGE | OROMUCOSAL | Status: DC | PRN
  Filled 2017-10-20: qty 9

## 2017-10-20 MED ORDER — LOSARTAN POTASSIUM-HCTZ 100-12.5 MG PO TABS: 1.0000 | ORAL_TABLET | Freq: Every day | ORAL | Status: DC

## 2017-10-20 MED ORDER — ENOXAPARIN SODIUM 30 MG/0.3ML ~~LOC~~ SOLN
30.0000 mg | Freq: Two times a day (BID) | SUBCUTANEOUS | Status: DC
  Administered 2017-10-21 – 2017-10-22 (×3): 30 mg via SUBCUTANEOUS
  Filled 2017-10-20 (×3): qty 0.3

## 2017-10-20 MED ORDER — ONDANSETRON HCL 4 MG/2ML IJ SOLN: 4.0000 mg | Freq: Once | INTRAMUSCULAR | Status: DC | PRN

## 2017-10-20 MED ORDER — OXYCODONE HCL 5 MG PO TABS: 10.0000 mg | ORAL_TABLET | ORAL | Status: DC | PRN

## 2017-10-20 MED ORDER — BISACODYL 10 MG RE SUPP: 10.0000 mg | Freq: Every day | RECTAL | Status: DC | PRN

## 2017-10-20 MED ORDER — MIDAZOLAM HCL 5 MG/5ML IJ SOLN
INTRAMUSCULAR | Status: DC | PRN
  Administered 2017-10-20: 1 mg via INTRAVENOUS

## 2017-10-20 MED ORDER — FERROUS SULFATE 325 (65 FE) MG PO TABS
325.0000 mg | ORAL_TABLET | Freq: Two times a day (BID) | ORAL | Status: DC
  Administered 2017-10-20 – 2017-10-22 (×4): 325 mg via ORAL
  Filled 2017-10-20 (×4): qty 1

## 2017-10-20 MED ORDER — CEFAZOLIN SODIUM-DEXTROSE 2-4 GM/100ML-% IV SOLN
INTRAVENOUS | Status: AC
  Filled 2017-10-20: qty 100

## 2017-10-20 MED ORDER — HYDROCHLOROTHIAZIDE 12.5 MG PO CAPS
12.5000 mg | ORAL_CAPSULE | Freq: Every day | ORAL | Status: DC
  Administered 2017-10-21 – 2017-10-22 (×2): 12.5 mg via ORAL
  Filled 2017-10-20 (×2): qty 1

## 2017-10-20 MED ORDER — HYDROMORPHONE HCL 1 MG/ML IJ SOLN: 0.5000 mg | INTRAMUSCULAR | Status: DC | PRN

## 2017-10-20 MED ORDER — NITROGLYCERIN 0.4 MG SL SUBL: 0.4000 mg | SUBLINGUAL_TABLET | SUBLINGUAL | Status: DC | PRN

## 2017-10-20 MED ORDER — FAMOTIDINE 20 MG PO TABS
20.0000 mg | ORAL_TABLET | Freq: Once | ORAL | Status: AC
  Administered 2017-10-20: 20 mg via ORAL

## 2017-10-20 MED ORDER — VITAMIN D (ERGOCALCIFEROL) 1.25 MG (50000 UNIT) PO CAPS
50000.0000 [IU] | ORAL_CAPSULE | ORAL | Status: DC
  Filled 2017-10-20: qty 1

## 2017-10-20 MED ORDER — ACETAMINOPHEN 10 MG/ML IV SOLN
INTRAVENOUS | Status: DC | PRN
  Administered 2017-10-20: 1000 mg via INTRAVENOUS

## 2017-10-20 MED ORDER — METOCLOPRAMIDE HCL 10 MG PO TABS
10.0000 mg | ORAL_TABLET | Freq: Three times a day (TID) | ORAL | Status: DC
  Administered 2017-10-20 – 2017-10-21 (×3): 10 mg via ORAL
  Filled 2017-10-20 (×7): qty 1

## 2017-10-20 MED ORDER — PHENOL 1.4 % MT LIQD
1.0000 | OROMUCOSAL | Status: DC | PRN
  Filled 2017-10-20: qty 177

## 2017-10-20 MED ORDER — ONDANSETRON HCL 4 MG/2ML IJ SOLN: 4.0000 mg | Freq: Four times a day (QID) | INTRAMUSCULAR | Status: DC | PRN

## 2017-10-20 MED ORDER — ACETAMINOPHEN 325 MG PO TABS: 325.0000 mg | ORAL_TABLET | Freq: Four times a day (QID) | ORAL | Status: DC | PRN

## 2017-10-20 MED ORDER — METOCLOPRAMIDE HCL 10 MG PO TABS: 5.0000 mg | ORAL_TABLET | Freq: Three times a day (TID) | ORAL | Status: DC | PRN

## 2017-10-20 MED ORDER — MAGNESIUM HYDROXIDE 400 MG/5ML PO SUSP: 30.0000 mL | Freq: Every day | ORAL | Status: DC | PRN

## 2017-10-20 MED ORDER — FENTANYL CITRATE (PF) 100 MCG/2ML IJ SOLN
INTRAMUSCULAR | Status: DC | PRN
  Administered 2017-10-20: 100 ug via INTRAVENOUS

## 2017-10-20 MED ORDER — SODIUM CHLORIDE 0.9 % IV SOLN
INTRAVENOUS | Status: DC
  Administered 2017-10-20 – 2017-10-21 (×2): via INTRAVENOUS

## 2017-10-20 MED ORDER — NEOMYCIN-POLYMYXIN B GU 40-200000 IR SOLN
Status: AC
  Filled 2017-10-20: qty 20

## 2017-10-20 MED ORDER — PROPOFOL 500 MG/50ML IV EMUL
INTRAVENOUS | Status: AC
  Filled 2017-10-20: qty 50

## 2017-10-20 MED ORDER — LOSARTAN POTASSIUM 50 MG PO TABS
100.0000 mg | ORAL_TABLET | Freq: Every day | ORAL | Status: DC
  Administered 2017-10-21 – 2017-10-22 (×2): 100 mg via ORAL
  Filled 2017-10-20 (×2): qty 2

## 2017-10-20 MED ORDER — VITAMIN D 1000 UNITS PO TABS
1000.0000 [IU] | ORAL_TABLET | Freq: Every day | ORAL | Status: DC
  Administered 2017-10-21 – 2017-10-22 (×2): 1000 [IU] via ORAL
  Filled 2017-10-20 (×2): qty 1

## 2017-10-20 MED ORDER — CELECOXIB 200 MG PO CAPS
200.0000 mg | ORAL_CAPSULE | Freq: Two times a day (BID) | ORAL | Status: DC
  Administered 2017-10-20 – 2017-10-22 (×3): 200 mg via ORAL
  Filled 2017-10-20 (×4): qty 1

## 2017-10-20 MED ORDER — DIPHENHYDRAMINE HCL 12.5 MG/5ML PO ELIX: 12.5000 mg | ORAL_SOLUTION | ORAL | Status: DC | PRN

## 2017-10-20 MED ORDER — CHLORHEXIDINE GLUCONATE 4 % EX LIQD: 60.0000 mL | Freq: Once | CUTANEOUS | Status: DC

## 2017-10-20 MED ORDER — PRAVASTATIN SODIUM 20 MG PO TABS
10.0000 mg | ORAL_TABLET | Freq: Every day | ORAL | Status: DC
  Administered 2017-10-20 – 2017-10-21 (×2): 10 mg via ORAL
  Filled 2017-10-20 (×2): qty 1

## 2017-10-20 MED ORDER — FLEET ENEMA 7-19 GM/118ML RE ENEM: 1.0000 | ENEMA | Freq: Once | RECTAL | Status: DC | PRN

## 2017-10-20 MED ORDER — FAMOTIDINE 20 MG PO TABS
ORAL_TABLET | ORAL | Status: AC
  Administered 2017-10-20: 20 mg via ORAL
  Filled 2017-10-20: qty 1

## 2017-10-20 MED ORDER — ACETAMINOPHEN 10 MG/ML IV SOLN
INTRAVENOUS | Status: AC
  Filled 2017-10-20: qty 100

## 2017-10-20 MED ORDER — DEXAMETHASONE SODIUM PHOSPHATE 10 MG/ML IJ SOLN
INTRAMUSCULAR | Status: AC
  Administered 2017-10-20: 8 mg via INTRAVENOUS
  Filled 2017-10-20: qty 1

## 2017-10-20 MED ORDER — TRANEXAMIC ACID 1000 MG/10ML IV SOLN
1000.0000 mg | Freq: Once | INTRAVENOUS | Status: AC
  Administered 2017-10-20: 1000 mg via INTRAVENOUS
  Filled 2017-10-20: qty 10

## 2017-10-20 MED ORDER — DULOXETINE HCL 60 MG PO CPEP
60.0000 mg | ORAL_CAPSULE | Freq: Every day | ORAL | Status: DC
  Administered 2017-10-21 – 2017-10-22 (×2): 60 mg via ORAL
  Filled 2017-10-20 (×2): qty 1

## 2017-10-20 MED ORDER — VITAMIN B-12 1000 MCG PO TABS
1000.0000 ug | ORAL_TABLET | Freq: Every day | ORAL | Status: DC
  Administered 2017-10-21 – 2017-10-22 (×2): 1000 ug via ORAL
  Filled 2017-10-20 (×2): qty 1

## 2017-10-20 MED ORDER — GLYCOPYRROLATE 0.2 MG/ML IJ SOLN
INTRAMUSCULAR | Status: AC
  Filled 2017-10-20: qty 1

## 2017-10-20 MED ORDER — GABAPENTIN 100 MG PO CAPS
100.0000 mg | ORAL_CAPSULE | Freq: Three times a day (TID) | ORAL | Status: DC
  Administered 2017-10-20 – 2017-10-22 (×6): 100 mg via ORAL
  Filled 2017-10-20 (×6): qty 1

## 2017-10-20 MED ORDER — GABAPENTIN 300 MG PO CAPS
ORAL_CAPSULE | ORAL | Status: AC
  Administered 2017-10-20: 300 mg via ORAL
  Filled 2017-10-20: qty 1

## 2017-10-20 MED ORDER — VITAMIN C 500 MG PO TABS
500.0000 mg | ORAL_TABLET | Freq: Every day | ORAL | Status: DC
  Administered 2017-10-21 – 2017-10-22 (×2): 500 mg via ORAL
  Filled 2017-10-20 (×3): qty 1

## 2017-10-20 MED ORDER — LACTATED RINGERS IV SOLN
INTRAVENOUS | Status: DC
  Administered 2017-10-20 (×3): via INTRAVENOUS

## 2017-10-20 MED ORDER — MIDAZOLAM HCL 2 MG/2ML IJ SOLN
INTRAMUSCULAR | Status: AC
  Filled 2017-10-20: qty 2

## 2017-10-20 MED ORDER — PHENYLEPHRINE HCL 10 MG/ML IJ SOLN
INTRAMUSCULAR | Status: DC | PRN
  Administered 2017-10-20 (×2): 100 ug via INTRAVENOUS

## 2017-10-20 MED ORDER — GABAPENTIN 300 MG PO CAPS
300.0000 mg | ORAL_CAPSULE | Freq: Once | ORAL | Status: AC
  Administered 2017-10-20: 300 mg via ORAL

## 2017-10-20 MED ORDER — CEFAZOLIN SODIUM-DEXTROSE 2-4 GM/100ML-% IV SOLN
2.0000 g | Freq: Four times a day (QID) | INTRAVENOUS | Status: AC
  Administered 2017-10-20 – 2017-10-21 (×4): 2 g via INTRAVENOUS
  Filled 2017-10-20 (×4): qty 100

## 2017-10-20 MED ORDER — PROPOFOL 500 MG/50ML IV EMUL
INTRAVENOUS | Status: DC | PRN
  Administered 2017-10-20: 70 ug/kg/min via INTRAVENOUS

## 2017-10-20 MED ORDER — NEOMYCIN-POLYMYXIN B GU 40-200000 IR SOLN
Status: DC | PRN
  Administered 2017-10-20: 16 mL

## 2017-10-20 MED ORDER — OXYCODONE HCL 5 MG PO TABS
5.0000 mg | ORAL_TABLET | ORAL | Status: DC | PRN
  Administered 2017-10-20 – 2017-10-21 (×3): 5 mg via ORAL
  Filled 2017-10-20 (×3): qty 1

## 2017-10-20 MED ORDER — FLUTICASONE PROPIONATE 50 MCG/ACT NA SUSP
1.0000 | Freq: Every day | NASAL | Status: DC | PRN
  Filled 2017-10-20: qty 16

## 2017-10-20 MED ORDER — TRAMADOL HCL 50 MG PO TABS
50.0000 mg | ORAL_TABLET | ORAL | Status: DC | PRN
  Administered 2017-10-20 – 2017-10-22 (×3): 100 mg via ORAL
  Filled 2017-10-20 (×3): qty 2

## 2017-10-20 MED ORDER — CARVEDILOL 3.125 MG PO TABS
6.2500 mg | ORAL_TABLET | Freq: Two times a day (BID) | ORAL | Status: DC
  Administered 2017-10-21 – 2017-10-22 (×3): 6.25 mg via ORAL
  Filled 2017-10-20 (×3): qty 2

## 2017-10-20 SURGICAL SUPPLY — 53 items
BLADE DRUM FLTD (BLADE) ×3 IMPLANT
BLADE SAW 1 (BLADE) ×3 IMPLANT
CANISTER SUCT 1200ML W/VALVE (MISCELLANEOUS) ×3 IMPLANT
CANISTER SUCT 3000ML PPV (MISCELLANEOUS) ×6 IMPLANT
CAPT HIP TOTAL 2 ×3 IMPLANT
CARTRIDGE OIL MAESTRO DRILL (MISCELLANEOUS) ×1 IMPLANT
DIFFUSER DRILL AIR PNEUMATIC (MISCELLANEOUS) ×3 IMPLANT
DRAPE INCISE IOBAN 66X60 STRL (DRAPES) ×3 IMPLANT
DRAPE SHEET LG 3/4 BI-LAMINATE (DRAPES) ×3 IMPLANT
DRSG DERMACEA 8X12 NADH (GAUZE/BANDAGES/DRESSINGS) ×3 IMPLANT
DRSG OPSITE POSTOP 4X12 (GAUZE/BANDAGES/DRESSINGS) ×3 IMPLANT
DRSG OPSITE POSTOP 4X14 (GAUZE/BANDAGES/DRESSINGS) IMPLANT
DRSG TEGADERM 4X4.75 (GAUZE/BANDAGES/DRESSINGS) ×3 IMPLANT
DURAPREP 26ML APPLICATOR (WOUND CARE) ×3 IMPLANT
ELECT BLADE 6.5 EXT (BLADE) ×3 IMPLANT
ELECT CAUTERY BLADE 6.4 (BLADE) ×3 IMPLANT
GLOVE BIOGEL M STRL SZ7.5 (GLOVE) ×6 IMPLANT
GLOVE BIOGEL PI IND STRL 9 (GLOVE) ×1 IMPLANT
GLOVE BIOGEL PI INDICATOR 9 (GLOVE) ×2
GLOVE INDICATOR 8.0 STRL GRN (GLOVE) ×3 IMPLANT
GLOVE SURG SYN 9.0  PF PI (GLOVE) ×2
GLOVE SURG SYN 9.0 PF PI (GLOVE) ×1 IMPLANT
GOWN STRL REUS W/ TWL LRG LVL3 (GOWN DISPOSABLE) ×2 IMPLANT
GOWN STRL REUS W/TWL 2XL LVL3 (GOWN DISPOSABLE) ×3 IMPLANT
GOWN STRL REUS W/TWL LRG LVL3 (GOWN DISPOSABLE) ×4
HEMOVAC 400CC 10FR (MISCELLANEOUS) ×3 IMPLANT
HOLDER FOLEY CATH W/STRAP (MISCELLANEOUS) ×3 IMPLANT
HOOD PEEL AWAY FLYTE STAYCOOL (MISCELLANEOUS) ×6 IMPLANT
KIT TURNOVER KIT A (KITS) ×3 IMPLANT
NDL SAFETY ECLIPSE 18X1.5 (NEEDLE) ×1 IMPLANT
NEEDLE HYPO 18GX1.5 SHARP (NEEDLE) ×2
NS IRRIG 500ML POUR BTL (IV SOLUTION) ×3 IMPLANT
OIL CARTRIDGE MAESTRO DRILL (MISCELLANEOUS) ×3
PACK HIP PROSTHESIS (MISCELLANEOUS) ×3 IMPLANT
PIN STEIN THRED 5/32 (Pin) ×3 IMPLANT
PULSAVAC PLUS IRRIG FAN TIP (DISPOSABLE) ×3
SOL .9 NS 3000ML IRR  AL (IV SOLUTION) ×2
SOL .9 NS 3000ML IRR UROMATIC (IV SOLUTION) ×1 IMPLANT
SOL PREP PVP 2OZ (MISCELLANEOUS) ×3
SOLUTION PREP PVP 2OZ (MISCELLANEOUS) ×1 IMPLANT
SPONGE DRAIN TRACH 4X4 STRL 2S (GAUZE/BANDAGES/DRESSINGS) ×3 IMPLANT
STAPLER SKIN PROX 35W (STAPLE) ×3 IMPLANT
SUT ETHIBOND #5 BRAIDED 30INL (SUTURE) ×3 IMPLANT
SUT VIC AB 0 CT1 36 (SUTURE) ×3 IMPLANT
SUT VIC AB 1 CT1 36 (SUTURE) ×6 IMPLANT
SUT VIC AB 2-0 CT1 27 (SUTURE) ×2
SUT VIC AB 2-0 CT1 TAPERPNT 27 (SUTURE) ×1 IMPLANT
SYR 20CC LL (SYRINGE) ×3 IMPLANT
TAPE ADH 3 LX (MISCELLANEOUS) ×3 IMPLANT
TAPE TRANSPORE STRL 2 31045 (GAUZE/BANDAGES/DRESSINGS) ×3 IMPLANT
TIP FAN IRRIG PULSAVAC PLUS (DISPOSABLE) ×1 IMPLANT
TOWEL OR 17X26 4PK STRL BLUE (TOWEL DISPOSABLE) ×3 IMPLANT
TRAY FOLEY W/METER SILVER 16FR (SET/KITS/TRAYS/PACK) ×3 IMPLANT

## 2017-10-20 NOTE — Transfer of Care (Signed)
Immediate Anesthesia Transfer of Care Note  Patient: Adam Lucas  Procedure(s) Performed: TOTAL HIP ARTHROPLASTY (Left )  Patient Location: PACU  Anesthesia Type:Spinal  Level of Consciousness: sedated  Airway & Oxygen Therapy: Patient Spontanous Breathing and Patient connected to nasal cannula oxygen  Post-op Assessment: Report given to RN and Post -op Vital signs reviewed and stable  Post vital signs: Reviewed and stable  Last Vitals:  Vitals Value Taken Time  BP    Temp    Pulse 72 10/20/2017 10:53 AM  Resp 9 10/20/2017 10:53 AM  SpO2 97 % 10/20/2017 10:53 AM  Vitals shown include unvalidated device data.  Last Pain:  Vitals:   10/20/17 0620  TempSrc: Oral  PainSc: 8          Complications: No apparent anesthesia complications

## 2017-10-20 NOTE — Anesthesia Post-op Follow-up Note (Signed)
Anesthesia QCDR form completed.        

## 2017-10-20 NOTE — Evaluation (Signed)
Occupational Therapy Evaluation Patient Details Name: Adam Lucas MRN: [id number] DOB: 09-17-1950 Today's Date: 10/20/2017    History of Present Illness Pt is a 67 yo M with a diagnosis of degenerative arthrosis of the L hip and is s/p elective L THA.  PMH includes: HTN, MI, cardiac stents, chronic LBP with surgery, CAD, and neuropathy.    Clinical Impression   Pt seen for OT evaluation this date, POD#0 from above surgery. Prior to evaluation, OT spoke with PT and determined pt appropriate to be seen POD#0. Pt was independent in all ADLs prior to surgery, however occasionally limited with community mobility 2:2 L hip pain. Pt is eager to return to PLOF with less pain and improved safety and independence. Pt currently requires minimal-mod assist for LB dressing and bathing while in seated position due to pain and limited AROM of L hip with hip precautions in place. Pt able to recall 2/3 posterior total hip precautions at start of session with 1 verbal cue for the 3rd precaution. Pt/spouse/daughter instructed in posterior total hip precautions and how to implement during mobility and ADL, self care skills, falls prevention strategies, home/routines modifications, pet care considerations (family has 2 small dogs in the home), DME/AE for LB bathing and dressing tasks, and compression stocking mgt strategies. Pt would benefit from additional instruction in self care skills and techniques to help maintain precautions with or without assistive devices to support recall and carryover prior to discharge. Pt exceeding expectations for participation POD#0. Recommend home with no OT follow up upon discharge.     Follow Up Recommendations  No OT follow up    Equipment Recommendations  3 in 1 bedside commode(3:1 to be used over commode at home)    Recommendations for Other Services       Precautions / Restrictions Precautions Precautions: Posterior Hip Precaution Booklet Issued: Yes (comment) Precaution  Comments: Pt able to recall 2/3 precautions with no cues, 1 verbal cue for 3rd precaution; additional education provided on posterior hip precautions to pt and spouse Restrictions Weight Bearing Restrictions: Yes LLE Weight Bearing: Weight bearing as tolerated      Mobility Bed Mobility              General bed mobility comments: pt up in recliner  Transfers Overall transfer level: Needs assistance Equipment used: Rolling walker (2 wheeled) Transfers: Sit to/from Stand Sit to Stand: Min guard         General transfer comment: cues for hip precautions    Balance Overall balance assessment: No apparent balance deficits (not formally assessed)                                         ADL either performed or assessed with clinical judgement   ADL Overall ADL's : Needs assistance/impaired Eating/Feeding: Independent;Sitting   Grooming: Standing;Min guard   Upper Body Bathing: Sitting;Modified independent   Lower Body Bathing: Sit to/from stand;Minimal assistance;Moderate assistance Lower Body Bathing Details (indicate cue type and reason): pt/spouse instructed in seated shower once cleared to maintain precautions and maximize safety Upper Body Dressing : Sitting;Modified independent   Lower Body Dressing: Sit to/from stand;Minimal assistance;Moderate assistance Lower Body Dressing Details (indicate cue type and reason): pt/spouse instructed in compression stocking mgt and AE for LB dressing with visual demo   Toilet Transfer Details (indicate cue type and reason): pt/spouse instructed in use of BSC frame overtop  regular commode at home and use of urinal overnight to minimize falls risk and maximize safety and independence while performing and maintaining posterior THPs                 Vision Baseline Vision/History: Wears glasses Wears Glasses: Reading only Patient Visual Report: No change from baseline       Perception     Praxis       Pertinent Vitals/Pain Pain Assessment: 0-10 Pain Score: 5  Pain Location: L hip Pain Descriptors / Indicators: Aching;Sore;Operative site guarding Pain Intervention(s): Limited activity within patient's tolerance;Monitored during session;Premedicated before session     Hand Dominance     Extremity/Trunk Assessment Upper Extremity Assessment Upper Extremity Assessment: Overall WFL for tasks assessed   Lower Extremity Assessment Lower Extremity Assessment: Defer to PT evaluation LLE Deficits / Details: L hip flex at least 3/5 tested during functional mobility but not formally tested; B ankle DF and PF strong against manual resistance LLE Sensation: WNL   Cervical / Trunk Assessment Cervical / Trunk Assessment: Normal   Communication Communication Communication: No difficulties   Cognition Arousal/Alertness: Awake/alert Behavior During Therapy: WFL for tasks assessed/performed Overall Cognitive Status: Within Functional Limits for tasks assessed                                     General Comments       Exercises Other Exercises Other Exercises: Pt/spouse instructed on pet care considerations to maximize safety and falls prevention in the home   Shoulder Instructions      Home Living Family/patient expects to be discharged to:: Private residence Living Arrangements: Spouse/significant other;Children Available Help at Discharge: Family;Available 24 hours/day Type of Home: House Home Access: Stairs to enter [employer] of Steps: 5 Entrance Stairs-Rails: Right;Left;Can reach both Home Layout: One level     Bathroom Shower/Tub: Occupational psychologist: Standard     Home Equipment: Shower seat - built Hotel manager: [employer]acher        Prior Functioning/Environment Level of Independence: Independent        Comments: Ind amb community distances somewhat limited by L hip pain, Ind with ADLs, no fall  history        OT Problem List: Decreased knowledge of use of DME or AE;Decreased knowledge of precautions;Pain;Impaired balance (sitting and/or standing)      OT Treatment/Interventions: Self-care/ADL training;Therapeutic exercise;Therapeutic activities;Balance training;DME and/or AE instruction;Patient/family education    OT Goals(Current goals can be found in the care plan section) Acute Rehab OT Goals Patient Stated Goal: to return to PLOF with less hip pain and improved function OT Goal Formulation: With patient/family Time For Goal Achievement: 10/27/17 Potential to Achieve Goals: Good ADL Goals Pt Will Perform Lower Body Dressing: with adaptive equipment;with caregiver independent in assisting;sit to/from stand(spouse for TED hose, AE for LBd, maintaining post. THPs t/o) Pt Will Transfer to Toilet: with supervision;ambulating(RW, BSC frame over commode, maintaining post. THPs t/o) Additional ADL Goal #1: Pt will demo learned techniques to maintain 3/3 posterior THPs during functional mobility and ADL tasks with <10% verbal cues.  OT Frequency: Min 1X/week   Barriers to D/C:            Co-evaluation              AM-PAC PT "6 Clicks" Daily Activity     Outcome Measure Help from another person eating meals?: None  Help from another person taking care of personal grooming?: None Help from another person toileting, which includes using toliet, bedpan, or urinal?: A Little Help from another person bathing (including washing, rinsing, drying)?: A Little Help from another person to put on and taking off regular upper body clothing?: None Help from another person to put on and taking off regular lower body clothing?: A Little 6 Click Score: 21   End of Session    Activity Tolerance: Patient tolerated treatment well Patient left: in chair;with call bell/phone within reach;with chair alarm set;with family/visitor present;with SCD's reapplied  OT Visit Diagnosis: Other  abnormalities of gait and mobility (R26.89)                Time: 4356-8616 OT Time Calculation (min): 19 min Charges:  OT General Charges $OT Visit: 1 Visit OT Evaluation $OT Eval Low Complexity: 1 Low OT Treatments $Self Care/Home Management : 8-22 mins  Jeni Salles, MPH, MS, OTR/L ascom [phone] 10/20/17, 4:40 PM

## 2017-10-20 NOTE — Anesthesia Preprocedure Evaluation (Signed)
Anesthesia Evaluation  Patient identified by MRN, date of birth, ID band Patient awake    Reviewed: Allergy & Precautions, H&P , NPO status , Patient's Chart, lab work & pertinent test results, reviewed documented beta blocker date and time   History of Anesthesia Complications Negative for: history of anesthetic complications  Airway Mallampati: I  TM Distance: >3 FB Neck ROM: full    Dental  (+) Dental Advidsory Given, Missing   Pulmonary neg pulmonary ROS,           Cardiovascular Exercise Tolerance: Good hypertension, (-) angina+ CAD, + Past MI and + Cardiac Stents  (-) CABG (-) dysrhythmias + Valvular Problems/Murmurs      Neuro/Psych neg Seizures  Neuromuscular disease negative psych ROS   GI/Hepatic negative GI ROS, Neg liver ROS,   Endo/Other  negative endocrine ROS  Renal/GU Renal disease (kidney stones)  negative genitourinary   Musculoskeletal   Abdominal   Peds  Hematology negative hematology ROS (+)   Anesthesia Other Findings Past Medical History: No date: Chronic lower back pain No date: Coronary artery disease No date: Degenerative arthritis ~ 1958: Heart murmur No date: High cholesterol No date: History of kidney stones No date: Hypertension No date: Kidney stones No date: Migraine     Comment:  "once q year or 2" (08/09/2014) 07/17/2014: Myocardial infarction ([place]) No date: Neuropathy   Reproductive/Obstetrics negative OB ROS                             Anesthesia Physical Anesthesia Plan  ASA: II  Anesthesia Plan: Spinal   Post-op Pain Management:    Induction:   PONV Risk Score and Plan: Propofol infusion  Airway Management Planned: Simple Face Mask  Additional Equipment:   Intra-op Plan:   Post-operative Plan:   Informed Consent: I have reviewed the patients History and Physical, chart, labs and discussed the procedure including the risks,  benefits and alternatives for the proposed anesthesia with the patient or authorized representative who has indicated his/her understanding and acceptance.   Dental Advisory Given  Plan Discussed with: Anesthesiologist, CRNA and Surgeon  Anesthesia Plan Comments:         Anesthesia Quick Evaluation

## 2017-10-20 NOTE — NC FL2 (Signed)
[place] LEVEL OF CARE SCREENING TOOL     IDENTIFICATION  Patient Name: Adam Lucas Birthdate: February 08, 1951 Sex: male Admission Date (Current Location): 10/20/2017  [place] and [place] Number:  Engineering geologist and Address:  [facility], [address], [place], [place] [address]      Provider Number: [id number]  Attending Physician Name and Address:  Dereck Leep, MD  Relative Name and Phone Number:       Current Level of Care: Hospital Recommended Level of Care: [place] Prior Approval Number:    Date Approved/Denied:   PASRR Number: ([id number])  Discharge Plan: SNF    Current Diagnoses: Patient Active Problem List   Diagnosis Date Noted  . Carpal tunnel syndrome 10/20/2017  . Status post total replacement of hip 10/20/2017  . CAD (coronary artery disease) 08/09/2014  . 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 CAD (coronary artery disease), native coronary artery 07/17/2014  . Acute MI, inferior wall, initial episode of care ([place]) 07/17/2014  . Hyperglycemia, unspecified 12/31/2013  . Chronic back pain 12/18/2013  . ED (erectile dysfunction) 12/18/2013  . HTN (hypertension), benign 12/18/2013    Orientation RESPIRATION BLADDER Height & Weight     Self, Time, Situation, Place  Normal Continent Weight: 208 lb (94.3 kg) Height:  5\' 9"  (175.3 cm)  BEHAVIORAL SYMPTOMS/MOOD NEUROLOGICAL BOWEL NUTRITION STATUS      Continent Diet(Diet: Heart Healthy )  AMBULATORY STATUS COMMUNICATION OF NEEDS Skin   Extensive Assist Verbally Surgical wounds(Incision: Left Hip. )                       Personal Care Assistance Level of Assistance  Bathing, Feeding, Dressing Bathing Assistance: Limited assistance Feeding assistance: Independent Dressing Assistance: Limited assistance     Functional Limitations Info  Sight, Hearing, Speech Sight Info: Adequate Hearing Info: Impaired Speech Info: Adequate     SPECIAL CARE FACTORS FREQUENCY  PT (By licensed PT), OT (By licensed OT)     PT Frequency: (5) OT Frequency: (5)            Contractures      Additional Factors Info  Code Status, Allergies Code Status Info: (Full Code. ) Allergies Info: (Atorvastatin, Crestor Rosuvastatin Calcium)           Current Medications (10/20/2017):  This is the current hospital active medication list Current Facility-Administered Medications  Medication Dose Route Frequency Provider Last Rate Last Dose  . 0.9 %  sodium chloride infusion   Intravenous Continuous Hooten, Laurice Record, MD 100 mL/hr at 10/20/17 1235    . acetaminophen (OFIRMEV) IV 1,000 mg  1,000 mg Intravenous Q6H Hooten, Laurice Record, MD   Stopped at 10/20/17 1606  . [START ON 10/21/2017] acetaminophen (TYLENOL) tablet 325-650 mg  325-650 mg Oral Q6H PRN Hooten, Laurice Record, MD      . alum & mag hydroxide-simeth (MAALOX/MYLANTA) 200-200-20 MG/5ML suspension 30 mL  30 mL Oral Q4H PRN Hooten, Laurice Record, MD      . bisacodyl (DULCOLAX) suppository 10 mg  10 mg Rectal Daily PRN Hooten, Laurice Record, MD      . carvedilol (COREG) tablet 6.25 mg  6.25 mg Oral BID WC Hooten, Laurice Record, MD      . ceFAZolin (ANCEF) IVPB 2g/100 mL premix  2 g Intravenous Q6H Hooten, Laurice Record, MD   Stopped at 10/20/17 1443  . celecoxib (CELEBREX) capsule 200 mg  200 mg Oral BID Skip Estimable  P, MD      . cholecalciferol (VITAMIN D) tablet 1,000 Units  1,000 Units Oral Daily Hooten, Laurice Record, MD      . diphenhydrAMINE (BENADRYL) 12.5 MG/5ML elixir 12.5-25 mg  12.5-25 mg Oral Q4H PRN Hooten, Laurice Record, MD      . Derrill Memo ON 10/21/2017] DULoxetine (CYMBALTA) DR capsule 60 mg  60 mg Oral Daily Hooten, Laurice Record, MD      . Derrill Memo ON 10/21/2017] enoxaparin (LOVENOX) injection 30 mg  30 mg Subcutaneous Q12H Hooten, Laurice Record, MD      . ferrous sulfate tablet 325 mg  325 mg Oral BID WC Hooten, Laurice Record, MD   325 mg at 10/20/17 1606  . fluticasone (FLONASE) 50 MCG/ACT nasal spray 1 spray  1 spray Each Nare  Daily PRN Hooten, Laurice Record, MD      . gabapentin (NEURONTIN) capsule 100 mg  100 mg Oral TID Dereck Leep, MD   100 mg at 10/20/17 1606  . losartan (COZAAR) tablet 100 mg  100 mg Oral Daily Hooten, Laurice Record, MD       And  . hydrochlorothiazide (MICROZIDE) capsule 12.5 mg  12.5 mg Oral Daily Hooten, Laurice Record, MD      . HYDROmorphone (DILAUDID) injection 0.5-1 mg  0.5-1 mg Intravenous Q4H PRN Hooten, Laurice Record, MD      . magnesium gluconate (MAGONATE) tablet 500 mg  500 mg Oral BID Hooten, Laurice Record, MD      . magnesium hydroxide (MILK OF MAGNESIA) suspension 30 mL  30 mL Oral Daily PRN Hooten, Laurice Record, MD      . menthol-cetylpyridinium (CEPACOL) lozenge 3 mg  1 lozenge Oral PRN Hooten, Laurice Record, MD       Or  . phenol (CHLORASEPTIC) mouth spray 1 spray  1 spray Mouth/Throat PRN Hooten, Laurice Record, MD      . metoCLOPramide (REGLAN) tablet 5-10 mg  5-10 mg Oral Q8H PRN Hooten, Laurice Record, MD       Or  . metoCLOPramide (REGLAN) injection 5-10 mg  5-10 mg Intravenous Q8H PRN Hooten, Laurice Record, MD      . metoCLOPramide (REGLAN) tablet 10 mg  10 mg Oral TID AC & HS Hooten, Laurice Record, MD   10 mg at 10/20/17 1606  . nitroGLYCERIN (NITROSTAT) SL tablet 0.4 mg  0.4 mg Sublingual Q5 Min x 3 PRN Hooten, Laurice Record, MD      . ondansetron (ZOFRAN) tablet 4 mg  4 mg Oral Q6H PRN Hooten, Laurice Record, MD       Or  . ondansetron (ZOFRAN) injection 4 mg  4 mg Intravenous Q6H PRN Hooten, Laurice Record, MD      . oxyCODONE (Oxy IR/ROXICODONE) immediate release tablet 10 mg  10 mg Oral Q4H PRN Hooten, Laurice Record, MD      . oxyCODONE (Oxy IR/ROXICODONE) immediate release tablet 5 mg  5 mg Oral Q4H PRN Hooten, Laurice Record, MD   5 mg at 10/20/17 1356  . pantoprazole (PROTONIX) EC tablet 40 mg  40 mg Oral BID Hooten, Laurice Record, MD      . pravastatin (PRAVACHOL) tablet 10 mg  10 mg Oral QHS Hooten, Laurice Record, MD      . senna-docusate (Senokot-S) tablet 1 tablet  1 tablet Oral BID Hooten, Laurice Record, MD      . sodium phosphate (FLEET) 7-19 GM/118ML enema 1 enema   1 enema Rectal Once PRN Hooten, Laurice Record, MD      .  traMADol (ULTRAM) tablet 50-100 mg  50-100 mg Oral Q4H PRN Hooten, Laurice Record, MD      . vitamin B-12 (CYANOCOBALAMIN) tablet 1,000 mcg  1,000 mcg Oral Daily Hooten, Laurice Record, MD      . vitamin C (ASCORBIC ACID) tablet 500 mg  500 mg Oral Daily Hooten, Laurice Record, MD      . Vitamin D (Ergocalciferol) (DRISDOL) capsule 50,000 Units  50,000 Units Oral Q Mon Hooten, Laurice Record, MD         Discharge Medications: Please see discharge summary for a list of discharge medications.  Relevant Imaging Results:  Relevant Lab Results:   Additional Information (SSN: [id number])  Kyana Aicher, Veronia Beets, LCSW

## 2017-10-20 NOTE — H&P (Signed)
The patient has been re-examined, and the chart reviewed, and there have been no interval changes to the documented history and physical.    The risks, benefits, and alternatives have been discussed at length. The patient expressed understanding of the risks benefits and agreed with plans for surgical intervention.  James P. Hooten, Jr. M.D.    

## 2017-10-20 NOTE — Op Note (Signed)
OPERATIVE NOTE  DATE OF SURGERY:  10/20/2017  PATIENT NAME:  Adam Lucas   DOB: 31-Oct-1950  MRN: [id number]  PRE-OPERATIVE DIAGNOSIS: Degenerative arthrosis of the left hip, primary  POST-OPERATIVE DIAGNOSIS:  Same  PROCEDURE:  Left total hip arthroplasty  SURGEON:  Marciano Sequin. M.D.  ASSISTANT:  Vance Peper, PA (present and scrubbed throughout the case, critical for assistance with exposure, retraction, instrumentation, and closure)  ANESTHESIA: spinal  ESTIMATED BLOOD LOSS: 500 mL  FLUIDS REPLACED: 2500 mL of crystalloid  DRAINS: 2 medium drains to a Hemovac reservoir  IMPLANTS UTILIZED: DePuy 13.5 mm large stature AML femoral stem, 54 mm OD Pinnacle 100 acetabular component, neutral Pinnacle Marathon polyethylene insert, and a 36 mm M-SPEC +1.5 mm hip ball  INDICATIONS FOR SURGERY: Adam Lucas is a 67 y.o. year old male with a long history of progressive hip and groin  pain. X-rays demonstrated severe degenerative changes. The patient had not seen any significant improvement despite conservative nonsurgical intervention. After discussion of the risks and benefits of surgical intervention, the patient expressed understanding of the risks benefits and agree with plans for total hip arthroplasty.   The risks, benefits, and alternatives were discussed at length including but not limited to the risks of infection, bleeding, nerve injury, stiffness, blood clots, the need for revision surgery, limb length inequality, dislocation, cardiopulmonary complications, among others, and they were willing to proceed.  PROCEDURE IN DETAIL: The patient was brought into the operating room and, after adequate spinal anesthesia was achieved, the patient was placed in a right lateral decubitus position. Axillary roll was placed and all bony prominences were well-padded. The patient's left hip was cleaned and prepped with alcohol and DuraPrep and draped in the usual sterile fashion. A "timeout" was  performed as per usual protocol. A lateral curvilinear incision was made gently curving towards the posterior superior iliac spine. The IT band was incised in line with the skin incision and the fibers of the gluteus maximus were split in line. The piriformis tendon was identified, skeletonized, and incised at its insertion to the proximal femur and reflected posteriorly. A T type posterior capsulotomy was performed. Prior to dislocation of the femoral head, a threaded Steinmann pin was inserted through a separate stab incision into the pelvis superior to the acetabulum and bent in the form of a stylus so as to assess limb length and hip offset throughout the procedure. The femoral head was then dislocated posteriorly. Inspection of the femoral head demonstrated severe degenerative changes with full-thickness loss of articular cartilage. The femoral neck cut was performed using an oscillating saw. The anterior capsule was elevated off of the femoral neck using a periosteal elevator. Attention was then directed to the acetabulum. The remnant of the labrum was excised using electrocautery. Inspection of the acetabulum also demonstrated significant degenerative changes. The acetabulum was reamed in sequential fashion up to a 53 mm diameter. Good punctate bleeding bone was encountered. A 54 mm Pinnacle 100 acetabular component was positioned and impacted into place. Good scratch fit was appreciated. A neutral polyethylene trial was inserted.  Attention was then directed to the proximal femur. A hole for reaming of the proximal femoral canal was created using a high-speed burr. The femoral canal was reamed in sequential fashion up to a 13 mm diameter. This allowed for approximately 6 cm of scratch fit.  It was thus elected to ream up to a 13.5 mm diameter to allow for a line to line fit.  Serial  broaches were inserted up to a 13.5 mm large stature femoral broach. Calcar region was planed and a trial reduction was  performed using a 36 mm hip ball with a +1.5 mm neck length. Good equalization of limb lengths and hip offset was appreciated and excellent stability was noted both anteriorly and posteriorly. Trial components were removed. The acetabular shell was irrigated with copious amounts of normal saline with antibiotic solution and suctioned dry. A neutral Pinnacle Marathon polyethylene insert was positioned and impacted into place. Next, a 13.5 mm large stature AML femoral stem was positioned and impacted into place. Excellent scratch fit was appreciated. A trial reduction was again performed with a 36 mm hip ball with a +1.5 mm neck length. Again, good equalization of limb lengths was appreciated and excellent stability appreciated both anteriorly and posteriorly. The hip was then dislocated and the trial hip ball was removed. The Morse taper was cleaned and dried. A 36 mm M-SPEC hip ball with a +1.5 mm neck length was placed on the trunnion and impacted into place. The hip was then reduced and placed through range of motion. Excellent stability was appreciated both anteriorly and posteriorly.  The wound was irrigated with copious amounts of normal saline with antibiotic solution and suctioned dry. Good hemostasis was appreciated. The posterior capsulotomy was repaired using #5 Ethibond. Piriformis tendon was reapproximated to the undersurface of the gluteus medius tendon using #5 Ethibond. Two medium drains were placed in the wound bed and brought out through separate stab incisions to be attached to a Hemovac reservoir. The IT band was reapproximated using interrupted sutures of #1 Vicryl. Subcutaneous tissue was approximated using first #0 Vicryl followed by #2-0 Vicryl. The skin was closed with skin staples.  The patient tolerated the procedure well and was transported to the recovery room in stable condition.   Marciano Sequin., M.D.

## 2017-10-20 NOTE — Discharge Instructions (Signed)
Instructions after Total Hip Replacement ° ° °  Treyana Sturgell P. Trinton Prewitt, Jr., M.D.    ° Dept. of Orthopaedics & Sports Medicine ° [facility] ° [address] ° [place], [place]  [address] ° Phone: [phone]   Fax: [phone] ° °  °DIET: °• Drink plenty of non-alcoholic fluids. °• Resume your normal diet. Include foods high in fiber. ° °ACTIVITY:  °• You may use crutches or a walker with weight-bearing as tolerated, unless instructed otherwise. °• You may be weaned off of the walker or crutches by your Physical Therapist.  °• Do NOT reach below the level of your knees or cross your legs until allowed.    °• Continue doing gentle exercises. Exercising will reduce the pain and swelling, increase motion, and prevent muscle weakness.   °• Please continue to use the TED compression stockings for 6 weeks. You may remove the stockings at night, but should reapply them in the morning. °• Do not drive or operate any equipment until instructed. ° °WOUND CARE:  °• Continue to use ice packs periodically to reduce pain and swelling. °• Keep the incision clean and dry. °• You may bathe or shower after the staples are removed at the first office visit following surgery. ° °MEDICATIONS: °• You may resume your regular medications. °• Please take the pain medication as prescribed on the medication. °• Do not take pain medication on an empty stomach. °• You have been given a prescription for a blood thinner to prevent blood clots. Please take the medication as instructed. (NOTE: After completing a 2 week course of Lovenox, take one Enteric-coated aspirin once a day.) °• Pain medications and iron supplements can cause constipation. Use a stool softener (Senokot or Colace) on a daily basis and a laxative (dulcolax or miralax) as needed. °• Do not drive or drink alcoholic beverages when taking pain medications. ° °CALL THE OFFICE FOR: °• Temperature above 101 degrees °• Excessive bleeding or drainage on the dressing. °• Excessive  swelling, coldness, or paleness of the toes. °• Persistent nausea and vomiting. ° °FOLLOW-UP:  °• You should have an appointment to return to the office in 6 weeks after surgery. °• Arrangements have been made for continuation of Physical Therapy (either home therapy or outpatient therapy). °  °

## 2017-10-20 NOTE — Anesthesia Procedure Notes (Addendum)
Spinal  Patient location during procedure: OR Start time: 10/20/2017 7:25 AM End time: 10/20/2017 7:34 AM Staffing Anesthesiologist: Martha Clan, MD Resident/CRNA: Bernardo Heater, CRNA Performed: resident/CRNA  Preanesthetic Checklist Completed: patient identified, site marked, surgical consent, pre-op evaluation, timeout performed, IV checked, risks and benefits discussed and monitors and equipment checked Spinal Block Patient position: sitting Prep: ChloraPrep Patient monitoring: heart rate, continuous pulse ox, blood pressure and cardiac monitor Approach: right paramedian Location: L2-3 Injection technique: single-shot Needle Needle type: Whitacre and Introducer  Needle gauge: 24 G Needle length: 9 cm Assessment Sensory level: T10 Additional Notes Negative paresthesia. Negative blood return. Positive free-flowing CSF. Expiration date of kit checked and confirmed. Patient tolerated procedure well, without complications.

## 2017-10-20 NOTE — Evaluation (Signed)
Physical Therapy Evaluation Patient Details Name: Adam Lucas MRN: [id number] DOB: 12-19-1950 Today's Date: 10/20/2017   History of Present Illness  Pt is a 67 yo M with a diagnosis of degenerative arthrosis of the L hip and is s/p elective L THA.  PMH includes: HTN, MI, cardiac stents, chronic LBP with surgery, CAD, and neuropathy.     Clinical Impression  Pt presents with deficits in strength, transfers, mobility, gait, balance, and activity tolerance but overall did very well during the session.  Pt was Mod Ind with bed mobility tasks with extra effort required but no physical assistance.  Pt required CGA with transfers as well as cueing for posterior hip precaution compliance but was steady with good eccentric and concentric control.  Pt able to amb 30' with a RW and CGA with slightly decreased RLE step length and slow cadence but was steady without increased L hip pain with weight bearing.  I expect pt to progress very well while in acute care with stair assessment/training to follow in a future session.  Pt will benefit from HHPT services upon discharge to safely address above deficits for decreased caregiver assistance and eventual return to PLOF.      Follow Up Recommendations Home health PT    Equipment Recommendations  Rolling walker with 5" wheels;3in1 (PT)    Recommendations for Other Services       Precautions / Restrictions Precautions Precautions: Posterior Hip Precaution Booklet Issued: Yes (comment) Precaution Comments: Posterior hip precaution education provided to pt and spouse Restrictions Weight Bearing Restrictions: Yes LLE Weight Bearing: Weight bearing as tolerated      Mobility  Bed Mobility Overal bed mobility: Modified Independent             General bed mobility comments: Minimal extra time and effort with bed mobility tasks but no physical assistance required  Transfers Overall transfer level: Needs assistance Equipment used: Rolling walker  (2 wheeled) Transfers: Sit to/from Stand Sit to Stand: Min guard         General transfer comment: Mod verbal and visual cues for proper sequencing during transfers to maintain hip precautions  Ambulation/Gait Ambulation/Gait assistance: Min guard Ambulation Distance (Feet): 30 Feet Assistive device: Rolling walker (2 wheeled) Gait Pattern/deviations: Step-through pattern;Decreased step length - right;Decreased stance time - left Gait velocity: Decreased   General Gait Details: No increase in pain to L hip with WB/ambulation; Practiced 90 deg L turns to prevent CKC L hip IR  Stairs Stairs: (Deferred)          Wheelchair Mobility    Modified Rankin (Stroke Patients Only)       Balance Overall balance assessment: No apparent balance deficits (not formally assessed)                                           Pertinent Vitals/Pain Pain Assessment: 0-10 Pain Score: 8  Pain Location: L hip Pain Descriptors / Indicators: Aching;Sore;Operative site guarding Pain Intervention(s): Premedicated before session;Monitored during session    Home Living Family/patient expects to be discharged to:: Private residence Living Arrangements: Spouse/significant other;Children Available Help at Discharge: Family;Available 24 hours/day Type of Home: House Home Access: Stairs to enter Entrance Stairs-Rails: Right;Left;Can reach both Entrance Stairs-Number of Steps: 5 Home Layout: One level Home Equipment: None      Prior Function Level of Independence: Independent  Comments: Ind amb community distances somewhat limited by L hip pain, Ind with ADLs, no fall history     Hand Dominance        Extremity/Trunk Assessment   Upper Extremity Assessment Upper Extremity Assessment: Overall WFL for tasks assessed    Lower Extremity Assessment Lower Extremity Assessment: Generalized weakness;LLE deficits/detail LLE Deficits / Details: L hip flex at least  3/5 tested during functional mobility but not formally tested; B ankle DF and PF strong against manual resistance LLE Sensation: WNL       Communication   Communication: No difficulties  Cognition Arousal/Alertness: Awake/alert Behavior During Therapy: WFL for tasks assessed/performed Overall Cognitive Status: Within Functional Limits for tasks assessed                                        General Comments      Exercises Total Joint Exercises Ankle Circles/Pumps: Strengthening;Both;10 reps Quad Sets: Strengthening;Both;10 reps Gluteal Sets: Strengthening;Both;10 reps Long Arc Quad: AROM;Both;15 reps Knee Flexion: AROM;Both;15 reps Marching in Standing: AROM;Both;10 reps;Standing Other Exercises Other Exercises: Posterior hip precaution education with handout and during functional tasks with pt and spouse Other Exercises: HEP education and review per handout  Other Exercises: Sit to/from stand transfer training from various surfaces with cues to maintain hip precautions   Assessment/Plan    PT Assessment Patient needs continued PT services  PT Problem List Decreased strength;Decreased activity tolerance;Decreased mobility;Decreased knowledge of use of DME;Decreased safety awareness;Decreased knowledge of precautions       PT Treatment Interventions DME instruction;Gait training;Stair training;Functional mobility training;Balance training;Therapeutic exercise;Therapeutic activities;Patient/family education    PT Goals (Current goals can be found in the Care Plan section)  Acute Rehab PT Goals Patient Stated Goal: To walk better without pain PT Goal Formulation: With patient Time For Goal Achievement: 11/02/17 Potential to Achieve Goals: Good    Frequency BID   Barriers to discharge        Co-evaluation               AM-PAC PT "6 Clicks" Daily Activity  Outcome Measure Difficulty turning over in bed (including adjusting bedclothes, sheets  and blankets)?: A Little Difficulty moving from lying on back to sitting on the side of the bed? : A Little Difficulty sitting down on and standing up from a chair with arms (e.g., wheelchair, bedside commode, etc,.)?: Unable Help needed moving to and from a bed to chair (including a wheelchair)?: A Little Help needed walking in hospital room?: A Little Help needed climbing 3-5 steps with a railing? : A Little 6 Click Score: 16    End of Session Equipment Utilized During Treatment: Gait belt Activity Tolerance: Patient tolerated treatment well;No increased pain Patient left: in chair;with call bell/phone within reach;with chair alarm set;with SCD's reapplied;with family/visitor present Nurse Communication: Mobility status;Precautions PT Visit Diagnosis: Other abnormalities of gait and mobility (R26.89);Muscle weakness (generalized) (M62.81)    Time: 1610-9604 PT Time Calculation (min) (ACUTE ONLY): 46 min   Charges:   PT Evaluation $PT Eval Low Complexity: 1 Low PT Treatments $Therapeutic Exercise: 8-22 mins $Therapeutic Activity: 8-22 mins   PT G Codes:        DRoyetta Asal PT, DPT 10/20/17, 3:44 PM

## 2017-10-21 MED ORDER — TRAMADOL HCL 50 MG PO TABS: 50.0000 mg | ORAL_TABLET | ORAL | 0 refills | Status: AC | PRN

## 2017-10-21 MED ORDER — OXYCODONE HCL 5 MG PO TABS: 5.0000 mg | ORAL_TABLET | ORAL | 0 refills | Status: DC | PRN

## 2017-10-21 MED ORDER — ENOXAPARIN SODIUM 40 MG/0.4ML ~~LOC~~ SOLN: 40.0000 mg | SUBCUTANEOUS | 0 refills | Status: DC

## 2017-10-21 NOTE — Discharge Summary (Signed)
Physician Discharge Summary  Patient ID: Adam Lucas MRN: [id number] DOB/AGE: 02/03/1951 67 y.o.  Admit date: 10/20/2017 Discharge date: 10/22/2017  Admission Diagnoses:  PRIMARY OSTEOARTHRITIS OF LEFT HIP   Discharge Diagnoses: Patient Active Problem List   Diagnosis Date Noted  . Carpal tunnel syndrome 10/20/2017  . Status post total replacement of hip 10/20/2017  . CAD (coronary artery disease) 08/09/2014  . 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 CAD (coronary artery disease), native coronary artery 07/17/2014  . Acute MI, inferior wall, initial episode of care ([place]) 07/17/2014  . Hyperglycemia, unspecified 12/31/2013  . Chronic back pain 12/18/2013  . ED (erectile dysfunction) 12/18/2013  . HTN (hypertension), benign 12/18/2013    Past Medical History:  Diagnosis Date  . Chronic lower back pain   . Coronary artery disease   . Degenerative arthritis   . Heart murmur ~ 1958  . High cholesterol   . History of kidney stones   . Hypertension   . Kidney stones   . Migraine    "once q year or 2" (08/09/2014)  . Myocardial infarction ([place]) 07/17/2014  . Neuropathy      Transfusion: No transfusions during this admission   Consultants (if any):   Discharged Condition: Improved  Hospital Course: Adam Lucas is an 67 y.o. male who was admitted 10/20/2017 with a diagnosis of degenerative arthrosis left hip and went to the operating room on 10/20/2017 and underwent the above named procedures.    Surgeries:Procedure(s): TOTAL HIP ARTHROPLASTY on 10/20/2017  PRE-OPERATIVE DIAGNOSIS: Degenerative arthrosis of the left hip, primary  POST-OPERATIVE DIAGNOSIS:  Same  PROCEDURE:  Left total hip arthroplasty  SURGEON:  Marciano Sequin. M.D.  ASSISTANT:  Vance Peper, PA (present and scrubbed throughout the case, critical for assistance with exposure, retraction, instrumentation, and closure)  ANESTHESIA: spinal  ESTIMATED BLOOD LOSS: 500 mL  FLUIDS REPLACED: 2500  mL of crystalloid  DRAINS: 2 medium drains to a Hemovac reservoir  IMPLANTS UTILIZED: DePuy 13.5 mm large stature AML femoral stem, 54 mm OD Pinnacle 100 acetabular component, neutral Pinnacle Marathon polyethylene insert, and a 36 mm M-SPEC +1.5 mm hip ball  INDICATIONS FOR SURGERY: Adam Lucas is a 67 y.o. year old male with a long history of progressive hip and groin  pain. X-rays demonstrated severe degenerative changes. The patient had not seen any significant improvement despite conservative nonsurgical intervention. After discussion of the risks and benefits of surgical intervention, the patient expressed understanding of the risks benefits and agree with plans for total hip arthroplasty.   The risks, benefits, and alternatives were discussed at length including but not limited to the risks of infection, bleeding, nerve injury, stiffness, blood clots, the need for revision surgery, limb length inequality, dislocation, cardiopulmonary complications, among others, and they were willing to proceed.   Patient tolerated the surgery well. No complications .Patient was taken to PACU where she was stabilized and then transferred to the orthopedic floor.  Patient started on Lovenox 30 mg q 12 hrs. Foot pumps applied bilaterally at 80 mm hgb. Heels elevated off bed with rolled towels. No evidence of DVT. Calves non tender. Negative Homan. Physical therapy started on day #1 for gait training and transfer with OT starting on  day #1 for ADL and assisted devices. Patient has done well with therapy. Ambulated greater than 200 feet upon being discharged.  Was able to ascend and descend 4 steps safely and independently  Patient's IV And Foley were discontinued on day #1 with  Hemovac being discontinued on day #2. Dressing was changed on day 2 prior to patient being discharged   He was given perioperative antibiotics:  Anti-infectives (From admission, onward)   Start     Dose/Rate Route Frequency  Ordered Stop   10/20/17 1215  ceFAZolin (ANCEF) IVPB 2g/100 mL premix     2 g 200 mL/hr over 30 Minutes Intravenous Every 6 hours 10/20/17 1200 10/21/17 0604   10/20/17 0600  ceFAZolin (ANCEF) IVPB 2g/100 mL premix     2 g 200 mL/hr over 30 Minutes Intravenous On call to O.R. 10/19/17 2230 10/20/17 0758   10/20/17 0558  ceFAZolin (ANCEF) 2-4 GM/100ML-% IVPB    Note to Pharmacy:  Jeanene Erb   : cabinet override      10/20/17 0558 10/20/17 0746    .  He was fitted with AV 1 compression foot pump devices, instructed on heel pumps, early ambulation, and fitted with TED stockings bilaterally for DVT prophylaxis.  He benefited maximally from the hospital stay and there were no complications.    Recent vital signs:  Vitals:   10/21/17 0029 10/21/17 0442  BP: 136/72 128/72  Pulse: 97 99  Resp: 16   Temp: 98.1 F (36.7 C) 98.3 F (36.8 C)  SpO2: 99% 98%    Recent laboratory studies:  Lab Results  Component Value Date   HGB 15.6 10/08/2017   HGB 12.8 (L) 08/10/2014   HGB 13.0 07/18/2014   Lab Results  Component Value Date   WBC 13.7 (H) 10/08/2017   PLT 237 10/08/2017   Lab Results  Component Value Date   INR 0.95 10/08/2017   Lab Results  Component Value Date   NA 136 10/08/2017   K 3.7 10/08/2017   CL 104 10/08/2017   CO2 25 10/08/2017   BUN 21 (H) 10/08/2017   CREATININE 1.05 10/08/2017   GLUCOSE 101 (H) 10/08/2017    Discharge Medications:   Allergies as of 10/21/2017      Reactions   Atorvastatin Other (See Comments)   Muscle/leg pain.   Crestor [rosuvastatin Calcium]    Muscle pain      Medication List    STOP taking these medications   aspirin 81 MG EC tablet     TAKE these medications   carvedilol 6.25 MG tablet Commonly known as:  COREG Take 1 tablet (6.25 mg total) by mouth 2 (two) times daily with a meal.   cholecalciferol 1000 units tablet Commonly known as:  VITAMIN D Take 1,000 Units by mouth daily.   DULoxetine 60 MG  capsule Commonly known as:  CYMBALTA Take 60 mg by mouth daily.   enoxaparin 40 MG/0.4ML injection Commonly known as:  LOVENOX Inject 0.4 mLs (40 mg total) into the skin daily for 14 days. Start taking on:  10/23/2017   fluticasone 50 MCG/ACT nasal spray Commonly known as:  FLONASE Place 1 spray into both nostrils daily as needed for allergies.   gabapentin 100 MG capsule Commonly known as:  NEURONTIN Take 100 mg by mouth 3 (three) times daily.   losartan-hydrochlorothiazide 100-12.5 MG tablet Commonly known as:  HYZAAR Take 1 tablet by mouth daily.   Magnesium Gluconate 500 (27 Mg) MG Tabs Take 1 tablet by mouth 2 (two) times daily.   nitroGLYCERIN 0.4 MG SL tablet Commonly known as:  NITROSTAT Place 1 tablet (0.4 mg total) under the tongue every 5 (five) minutes x 3 doses as needed for chest pain.   oxyCODONE 5 MG immediate release tablet  Commonly known as:  Oxy IR/ROXICODONE Take 1 tablet (5 mg total) by mouth every 4 (four) hours as needed for moderate pain (pain score 4-6).   pravastatin 10 MG tablet Commonly known as:  PRAVACHOL Take 10 mg by mouth at bedtime.   testosterone cypionate 100 MG/ML injection Commonly known as:  DEPOTESTOTERONE CYPIONATE Inject 1 mL into the muscle every 14 (fourteen) days.   traMADol 50 MG tablet Commonly known as:  ULTRAM Take 1-2 tablets (50-100 mg total) by mouth every 4 (four) hours as needed for moderate pain. What changed:    how much to take  when to take this  reasons to take this   Turmeric Curcumin 500 MG Caps Take 500 mg by mouth at bedtime.   TYLENOL 8 HOUR ARTHRITIS PAIN 650 MG CR tablet Generic drug:  acetaminophen Take 1,300 mg by mouth 2 (two) times daily.   vitamin B-12 500 MCG tablet Commonly known as:  CYANOCOBALAMIN Take 1,000 mcg by mouth daily.   vitamin C 500 MG tablet Commonly known as:  ASCORBIC ACID Take 500 mg by mouth daily.   Vitamin D (Ergocalciferol) 50000 units Caps capsule Commonly  known as:  DRISDOL Take 50,000 Units by mouth every Monday.            Durable Medical Equipment  (From admission, onward)        Start     Ordered   10/20/17 1201  DME Walker rolling  Once    Question:  Patient needs a walker to treat with the following condition  Answer:  S/P total hip arthroplasty   10/20/17 1200   10/20/17 1201  DME Bedside commode  Once    Question:  Patient needs a bedside commode to treat with the following condition  Answer:  S/P total hip arthroplasty   10/20/17 1200      Diagnostic Studies: Dg Hip Port Unilat With Pelvis 1v Left  Result Date: 10/20/2017 CLINICAL DATA:  Status post left hip joint prosthesis placement. EXAM: DG HIP (WITH OR WITHOUT PELVIS) 1V PORT LEFT COMPARISON:  Abdominal radiograph of April 20, 2007 which included the left hip. FINDINGS: There is a prosthetic left hip joint present. The interface with the native bone appears normal. No and acute native bone abnormality is observed. There surgical skin staples and drains present. IMPRESSION: No immediate complication following left hip joint prosthesis placement. Electronically Signed   By: David  Martinique M.D.   On: 10/20/2017 11:22    Disposition:   Discharge Instructions    Increase activity slowly   Complete by:  As directed       Follow-up Information    Hooten, Laurice Record, MD On 12/02/2017.   Specialty:  Orthopedic Surgery Why:  at 1:30pm Contact information: [place] [address] [phone]            Signed: Watt Climes 10/21/2017, 7:33 AM

## 2017-10-21 NOTE — Progress Notes (Signed)
Clinical Social Worker (CSW) received SNF consult. PT is recommending home health. RN case manager aware of above. Please reconsult if future social work needs arise. CSW signing off.   Demitrus Francisco, LCSW [phone] 

## 2017-10-21 NOTE — Progress Notes (Signed)
Physical Therapy Treatment Patient Details Name: Adam Lucas MRN: [id number] DOB: 1951/06/04 Today's Date: 10/21/2017    History of Present Illness Pt is a 67 yo M with a diagnosis of degenerative arthrosis of the L hip and is s/p elective L THA.  PMH includes: HTN, MI, cardiac stents, chronic LBP with surgery, CAD, and neuropathy.     PT Comments    Pt presents with minor deficits in strength, transfers, mobility, gait, balance, and activity tolerance and continues to progress very well towards goals.  Pt able to amb 2 x 150' with continued gait quality improvement including increased cadence, decreased WB through BUEs on the RW, and increased B step length.  Pt steady ascending and descending 4 stairs with B rails with spouse also present for training.  Pt will benefit from HHPT services upon discharge to safely address above deficits for decreased caregiver assistance and eventual return to PLOF.     Follow Up Recommendations  Home health PT     Equipment Recommendations  Rolling walker with 5" wheels    Recommendations for Other Services       Precautions / Restrictions Precautions Precautions: Posterior Hip Precaution Booklet Issued: Yes (comment) Precaution Comments: Pt able to recall 3/3 posterior hip precautions; posterior hip precaution education/review provided with pt and spouse during functional mobility Restrictions Weight Bearing Restrictions: Yes LLE Weight Bearing: Weight bearing as tolerated    Mobility  Bed Mobility Overal bed mobility: Modified Independent             General bed mobility comments: pt up in recliner  Transfers Overall transfer level: Needs assistance Equipment used: Rolling walker (2 wheeled) Transfers: Sit to/from Stand Sit to Stand: Supervision         General transfer comment: Min verbal cues for sequencing  Ambulation/Gait Ambulation/Gait assistance: Supervision Ambulation Distance (Feet): 150 Feet x 2 Assistive  device: Rolling walker (2 wheeled) Gait Pattern/deviations: Step-through pattern;Decreased step length - right;Decreased step length - left Gait velocity: Decreased but progressing   General Gait Details: RLE step length progressing well with increased cadence and very good stability; pt continues to required min verbal cues for sequencing during L turns to prevent CKC L hip IR      Stairs Stairs: Yes Stairs assistance: Supervision Stair Management: Two rails Number of Stairs: 4 General stair comments: Min visual and verbal cues for sequencing; good stability ascending and descending steps with pt and spouse present for training   Wheelchair Mobility    Modified Rankin (Stroke Patients Only)       Balance Overall balance assessment: No apparent balance deficits (not formally assessed)                                          Cognition Arousal/Alertness: Awake/alert Behavior During Therapy: WFL for tasks assessed/performed Overall Cognitive Status: Within Functional Limits for tasks assessed                                        Exercises Total Joint Exercises Ankle Circles/Pumps: Strengthening;Both;10 reps Quad Sets: Strengthening;Both;10 reps Gluteal Sets: Strengthening;Both;10 reps Long Arc Quad: AROM;Both;15 reps Knee Flexion: AROM;Both;15 reps Marching in Standing: AROM;Both;10 reps;Standing Other Exercises Other Exercises: Verbal and demonstration education for car transfer sequencing with pt and spouse  Other Exercises: Stair  training with pt and spouse    General Comments        Pertinent Vitals/Pain Pain Assessment: 0-10 Pain Score: 5  Pain Location: L hip Pain Descriptors / Indicators: Aching;Sore;Operative site guarding Pain Intervention(s): Monitored during session    Home Living                      Prior Function            PT Goals (current goals can now be found in the care plan section) Acute  Rehab PT Goals Patient Stated Goal: to return to PLOF with less hip pain and improved function Progress towards PT goals: Progressing toward goals    Frequency    BID      PT Plan Current plan remains appropriate    Co-evaluation              AM-PAC PT "6 Clicks" Daily Activity  Outcome Measure                   End of Session Equipment Utilized During Treatment: Gait belt Activity Tolerance: Patient tolerated treatment well;No increased pain Patient left: in chair;with call bell/phone within reach;with chair alarm set;with SCD's reapplied;Other (comment) Nurse Communication: Mobility status PT Visit Diagnosis: Other abnormalities of gait and mobility (R26.89);Muscle weakness (generalized) (M62.81)     Time: 0092-3300 PT Time Calculation (min) (ACUTE ONLY): 25 min  Charges:  $Gait Training: 8-22 mins $Therapeutic Exercise: 8-22 mins $Therapeutic Activity: 8-22 mins                    G Codes:       DRoyetta Asal PT, DPT 10/21/17, 3:40 PM

## 2017-10-21 NOTE — Progress Notes (Signed)
ORTHOPAEDICS PROGRESS NOTE  PATIENT NAME: Adam Lucas DOB: March 02, 1951  MRN: [id number]  POD # 1: Left total hip arthroplasty  Subjective: The patient rested well last night. Pain is been under good control.  No nausea or vomiting. The patient tolerated physical therapy well yesterday afternoon after surgery.  Objective: Vital signs in last 24 hours: Temp:  [97.4 F (36.3 C)-98.3 F (36.8 C)] 98.3 F (36.8 C) (05/07 0442) Pulse Rate:  [71-99] 99 (05/07 0442) Resp:  [9-20] 16 (05/07 0029) BP: (99-137)/(63-105) 128/72 (05/07 0442) SpO2:  [97 %-100 %] 98 % (05/07 0442)  Intake/Output from previous day: 05/06 0701 - 05/07 0700 In: 5510 [P.O.:1020; I.V.:3890; IV Piggyback:600] Out: 2265 [Urine:1625; Drains:140; Blood:500]  No results for input(s): WBC, HGB, HCT, PLT, K, CL, CO2, BUN, CREATININE, GLUCOSE, CALCIUM, LABPT, INR in the last 72 hours.  EXAM General: Well-nourished male seen in no apparent discomfort. Lungs: clear to auscultation Cardiac: normal rate and regular rhythm Left lower extremity: Hip dressing is clean and dry.  Hemovac is in place.  No thigh swelling or ecchymosis.  Homans test is negative. Neurologic: Awake, alert, and oriented.  Sensory and motor function are intact.  Assessment: Left total hip arthroplasty  Secondary diagnoses: Neuropathy Coronary artery disease Nephrolithiasis Hypercholesterolemia Hypertension Status post lumbar fusion  Plan: Today's goals were reviewed with the patient. Continue with physical therapy and Occupational Therapy as per total hip arthroplasty rehab protocol.  Posterior hip precautions are to be followed. Plan is to go Home after hospital stay. DVT Prophylaxis - Lovenox, Foot Pumps and TED hose  James P. Holley Bouche M.D.

## 2017-10-21 NOTE — Anesthesia Postprocedure Evaluation (Signed)
Anesthesia Post Note  Patient: Adam Lucas  Procedure(s) Performed: TOTAL HIP ARTHROPLASTY (Left )  Patient location during evaluation: Nursing Unit Anesthesia Type: Spinal Level of consciousness: awake and alert and oriented Pain management: pain level controlled Vital Signs Assessment: post-procedure vital signs reviewed and stable Respiratory status: spontaneous breathing and nonlabored ventilation Cardiovascular status: stable Postop Assessment: no backache, patient able to bend at knees, no apparent nausea or vomiting, able to ambulate and adequate PO intake Anesthetic complications: no     Last Vitals:  Vitals:   10/21/17 0029 10/21/17 0442  BP: 136/72 128/72  Pulse: 97 99  Resp: 16   Temp: 36.7 C 36.8 C  SpO2: 99% 98%    Last Pain:  Vitals:   10/21/17 0637  TempSrc:   PainSc: 4                  Lanora Manis

## 2017-10-21 NOTE — Care Management Note (Signed)
Case Management Note  Patient Details  Name: Adam Lucas MRN: [id number] Date of Birth: [birth year]  Subjective/Objective:   POD # 1 left total hip arthroplasty. Met with patient. He is sitting up in chair at bedside. He lives at home with his wife. Will need a walker. Ordered from [place] with [facility]. He has a bsc. Offered home care providers. Referral to [facility] for HHPT. [facility]acy: [facility]: [phone].[facility] Kitchen Cost of Lovenox is $ 45.24. Patient updated and denies issues paying for prescription.                  Action/Plan:   Expected Discharge Date:                  Expected Discharge Plan:  [place]  In-House Referral:     Discharge planning Services  CM Consult  Post Acute Care Choice:  Durable Medical Equipment, Home Health Choice offered to:  Patient  DME Arranged:  Walker rolling DME Agency:  [place]:  PT [place]:  [facility] (formerly [facility])  Status of Service:  In process, will continue to follow  If discussed at Long Length of Stay Meetings, dates discussed:    Additional Comments:  Jolly Mango, RN 10/21/2017, 10:01 AM

## 2017-10-21 NOTE — Progress Notes (Signed)
Occupational Therapy Treatment Patient Details Name: Adam Lucas MRN: [id number] DOB: 05/18/1951 Today's Date: 10/21/2017    History of present illness Pt is a 68 yo M with a diagnosis of degenerative arthrosis of the L hip and is s/p elective L THA.  PMH includes: HTN, MI, cardiac stents, chronic LBP with surgery, CAD, and neuropathy.    OT comments  Pt seen for OT treatment this date. Pt up in recliner, agreeable to therapy, reporting 5/10 L hip pain. Pt able to verbalize 3/3 posterior THPs and how to implement during bed mobility, transfers, ADL tasks, and ambulation independently. To maximize recall and carryover of learned techniques, OT instructed pt/spouse in use of AE for LB dressing with pt able to return demo proper use of reacher for LB dressing with no assist required and while maintaining precautions t/o. Instructed in positioning of 3:1 over commode for daily toileting needs vs urinal use overnight, bed mobility and positioning to maximize safety and adherence to precautions during bed mobility, and falls prevention strategies including minimizing distractions while ambulating. Pt has met all OT goals established. No additional OT follow up recommended. Will complete OT order. Please re-consult if additional skilled OT needs arise or if there is a change in pt status prior to discharge home.    Follow Up Recommendations  No OT follow up    Equipment Recommendations  3 in 1 bedside commode    Recommendations for Other Services      Precautions / Restrictions Precautions Precautions: Posterior Hip Precaution Comments: Pt able to recall 3/3 posterior THPs and able to verbalize how to implement learned strategies during all aspects of mobility and ADL to maintain precautions without verbal cues Restrictions Weight Bearing Restrictions: Yes LLE Weight Bearing: Weight bearing as tolerated       Mobility Bed Mobility               General bed mobility comments: pt up in  recliner  Transfers Overall transfer level: Needs assistance Equipment used: Rolling walker (2 wheeled) Transfers: Sit to/from Stand Sit to Stand: Supervision         General transfer comment: initial cues for precautions with excellent carryover     Balance Overall balance assessment: No apparent balance deficits (not formally assessed)                                         ADL either performed or assessed with clinical judgement   ADL Overall ADL's : Needs assistance/impaired                                       General ADL Comments: OT reviewed instruction provided previous session on AE for LB dressing with pt trialing use of reacher for LB dressing with no assist required while maintaining precautions t/o. Instructed in positioning of 3:1 over commode for daily toileting needs vs urinal use overnight, bed mobility and positioning to maximize safety and adherence to precautions during bed mobility, and falls prevention strategies including minimizing distractions while ambulating.      Vision Baseline Vision/History: Wears glasses Wears Glasses: Reading only Patient Visual Report: No change from baseline     Perception     Praxis      Cognition Arousal/Alertness: Awake/alert Behavior During Therapy: WFL for tasks assessed/performed Overall  Cognitive Status: Within Functional Limits for tasks assessed                                          Exercises     Shoulder Instructions       General Comments      Pertinent Vitals/ Pain       Pain Assessment: 0-10 Pain Score: 5  Pain Location: L hip Pain Descriptors / Indicators: Aching;Sore;Operative site guarding Pain Intervention(s): Limited activity within patient's tolerance;Monitored during session;Premedicated before session;Repositioned  Home Living                                          Prior Functioning/Environment               Frequency           Progress Toward Goals  OT Goals(current goals can now be found in the care plan section)  Progress towards OT goals: Goals met/education completed, patient discharged from OT  Acute Rehab OT Goals Patient Stated Goal: to return to PLOF with less hip pain and improved function OT Goal Formulation: All assessment and education complete, DC therapy  Plan All goals met and education completed, patient discharged from OT services    Co-evaluation                 AM-PAC PT "6 Clicks" Daily Activity     Outcome Measure   Help from another person eating meals?: None Help from another person taking care of personal grooming?: None Help from another person toileting, which includes using toliet, bedpan, or urinal?: None Help from another person bathing (including washing, rinsing, drying)?: A Little Help from another person to put on and taking off regular upper body clothing?: None Help from another person to put on and taking off regular lower body clothing?: A Little 6 Click Score: 22    End of Session    OT Visit Diagnosis: Other abnormalities of gait and mobility (R26.89)   Activity Tolerance Patient tolerated treatment well   Patient Left in chair;with call bell/phone within reach;with family/visitor present(SCDs off as pt states NT to assist with sponge bath shortly)   Nurse Communication          Time: 0354-6568 OT Time Calculation (min): 14 min  Charges: OT General Charges $OT Visit: 1 Visit OT Treatments $Self Care/Home Management : 8-22 mins  Jeni Salles, MPH, MS, OTR/L ascom [phone] 10/21/17, 11:56 AM

## 2017-10-21 NOTE — Progress Notes (Signed)
Physical Therapy Treatment Patient Details Name: Adam Lucas MRN: [id number] DOB: [birth year] Today's Date: 10/21/2017    History of Present Illness Pt is a 67 yo M with a diagnosis of degenerative arthrosis of the L hip and is s/p elective L THA.  PMH includes: HTN, MI, cardiac stents, chronic LBP with surgery, CAD, and neuropathy.     PT Comments    Pt presents with mild deficits in strength, transfers, mobility, gait, balance, and activity tolerance and is progressing very well towards goals.  Pt was able to amb 150' with a RW and SBA with improving gait quality and with good stability.  Pt was steady ascending and descending steps with B rails and reported no increased L hip pain during session.  Pt continues to require occasional cues for sequencing during functional mobility and gait to ensure compliance with posterior hip precautions but is progressing.  Pt will benefit from HHPT services upon discharge to safely address above deficits for decreased caregiver assistance and eventual return to PLOF.     Follow Up Recommendations  Home health PT     Equipment Recommendations  Rolling walker with 5" wheels;3in1 (PT)    Recommendations for Other Services       Precautions / Restrictions Precautions Precautions: Posterior Hip Precaution Booklet Issued: Yes (comment) Precaution Comments: Pt able to recall 2/3 posterior hip precautions; posterior hip precaution education/review provided Restrictions Weight Bearing Restrictions: Yes LLE Weight Bearing: Weight bearing as tolerated    Mobility  Bed Mobility Overal bed mobility: Modified Independent             General bed mobility comments: pt up in recliner  Transfers Overall transfer level: Needs assistance Equipment used: Rolling walker (2 wheeled) Transfers: Sit to/from Stand Sit to Stand: Supervision         General transfer comment: Min verbal cues for sequencing  Ambulation/Gait Ambulation/Gait assistance:  Min guard Ambulation Distance (Feet): 150 Feet Assistive device: Rolling walker (2 wheeled) Gait Pattern/deviations: Step-through pattern;Decreased step length - right;Decreased stance time - left Gait velocity: Decreased but progressing   General Gait Details: No increase in pain to L hip with WB/ambulation; Practiced 90 deg L turns to prevent CKC L hip IR with fair carryover; min verbal cues for sequencing for decreased UE WB through the RW   Stairs Stairs: Yes Stairs assistance: Min guard Stair Management: Two rails Number of Stairs: 4 General stair comments: Min visual and verbal cues for sequencing; good stability ascending and descending steps   Wheelchair Mobility    Modified Rankin (Stroke Patients Only)       Balance Overall balance assessment: No apparent balance deficits (not formally assessed)                                          Cognition Arousal/Alertness: Awake/alert Behavior During Therapy: WFL for tasks assessed/performed Overall Cognitive Status: Within Functional Limits for tasks assessed                                        Exercises Total Joint Exercises Ankle Circles/Pumps: Strengthening;Both;10 reps Quad Sets: Strengthening;Both;10 reps Gluteal Sets: Strengthening;Both;10 reps Long Arc Quad: AROM;Both;15 reps Knee Flexion: AROM;Both;15 reps Marching in Standing: AROM;Both;10 reps;Standing Other Exercises Other Exercises: HEP education and review per handout  Other Exercises:  Static balance training without UE support with feet apart, together, and semi-tandem with eyes open/closed and head still/head turns    General Comments        Pertinent Vitals/Pain Pain Assessment: 0-10 Pain Score: 5  Pain Location: L hip Pain Descriptors / Indicators: Aching;Sore;Operative site guarding Pain Intervention(s): Premedicated before session;Monitored during session    Home Living                       Prior Function            PT Goals (current goals can now be found in the care plan section) Acute Rehab PT Goals Patient Stated Goal: to return to PLOF with less hip pain and improved function Progress towards PT goals: Progressing toward goals    Frequency    BID      PT Plan Current plan remains appropriate    Co-evaluation              AM-PAC PT "6 Clicks" Daily Activity  Outcome Measure                   End of Session Equipment Utilized During Treatment: Gait belt Activity Tolerance: Patient tolerated treatment well;No increased pain Patient left: in chair;with call bell/phone within reach;with chair alarm set;with SCD's reapplied;Other (comment)(Abd pillows in place) Nurse Communication: Mobility status PT Visit Diagnosis: Other abnormalities of gait and mobility (R26.89);Muscle weakness (generalized) (M62.81)     Time: 1610-9604 PT Time Calculation (min) (ACUTE ONLY): 41 min  Charges:  $Gait Training: 8-22 mins $Therapeutic Exercise: 8-22 mins $Therapeutic Activity: 8-22 mins                    G Codes:       D. Scott Orie [place] PT, DPT 10/21/17, 1:15 PM

## 2017-10-22 LAB — SURGICAL PATHOLOGY

## 2017-10-22 NOTE — Progress Notes (Signed)
   Subjective: 2 Days Post-Op Procedure(s) (LRB): TOTAL HIP ARTHROPLASTY (Left) Patient reports pain as mild.   Patient is well, and has had no acute complaints or problems Patient did extremely well with physical therapy.  Was able to ambulate 150 feet x 2 as well as 2 steps..  Plan is to go Home after hospital stay. no nausea and no vomiting Patient denies any chest pains or shortness of breath. Objective: Vital signs in last 24 hours: Temp:  [98 F (36.7 C)-98.1 F (36.7 C)] 98.1 F (36.7 C) (05/07 2337) Pulse Rate:  [86-100] 86 (05/07 2337) Resp:  [16-20] 16 (05/07 2337) BP: (141-153)/(72-86) 153/86 (05/07 2337) SpO2:  [96 %-98 %] 97 % (05/07 2337) well approximated incision Heels are non tender and elevated off the bed using rolled towels Intake/Output from previous day: 05/07 0701 - 05/08 0700 In: 700 [P.O.:700] Out: 750 [Urine:600; Drains:150] Intake/Output this shift: Total I/O In: 220 [P.O.:220] Out: 600 [Urine:600]  No results for input(s): HGB in the last 72 hours. No results for input(s): WBC, RBC, HCT, PLT in the last 72 hours. No results for input(s): NA, K, CL, CO2, BUN, CREATININE, GLUCOSE, CALCIUM in the last 72 hours. No results for input(s): LABPT, INR in the last 72 hours.  EXAM General - Patient is Alert, Appropriate and Oriented Extremity - Neurologically intact Neurovascular intact Sensation intact distally Intact pulses distally Dorsiflexion/Plantar flexion intact No cellulitis present Compartment soft Dressing - scant drainage Motor Function - intact, moving foot and toes well on exam.    Past Medical History:  Diagnosis Date  . Chronic lower back pain   . Coronary artery disease   . Degenerative arthritis   . Heart murmur ~ 1958  . High cholesterol   . History of kidney stones   . Hypertension   . Kidney stones   . Migraine    "once q year or 2" (08/09/2014)  . Myocardial infarction ([place]) 07/17/2014  . Neuropathy      Assessment/Plan: 2 Days Post-Op Procedure(s) (LRB): TOTAL HIP ARTHROPLASTY (Left) Active Problems:   Status post total replacement of hip  Estimated body mass index is 30.72 kg/m as calculated from the following:   Height as of this encounter: 5\' 9"  (1.753 m).   Weight as of this encounter: 94.3 kg (208 lb). Up with therapy Discharge home with home health  Labs: None DVT Prophylaxis - Lovenox, Foot Pumps and TED hose Weight-Bearing as tolerated to left leg Please change dressing prior to patient being discharged and give the patient 2 extra honeycomb dressings to take home  Painter. [place] 10/22/2017, 6:54 AM

## 2017-10-22 NOTE — Care Management Note (Signed)
Case Management Note  Patient Details  Name: Adam Lucas MRN: [id number] Date of Birth: 05/02/1951  Subjective/Objective:  Discharging today                  Action/Plan: [facility] notified of discharge. Walker delivered.   Expected Discharge Date:  10/22/17               Expected Discharge Plan:  [place]  In-House Referral:     Discharge planning Services  CM Consult  Post Acute Care Choice:  Durable Medical Equipment, Home Health Choice offered to:  Patient  DME Arranged:  Walker rolling DME Agency:  [place]:  PT [place] Agency:  [facility] (formerly [facility]  Status of Service:  Completed, signed off  If discussed at [employer] of Stay Meetings, dates discussed:    Additional Comments:  Jolly Mango, RN 10/22/2017, 10:38 AM

## 2017-10-22 NOTE — Progress Notes (Signed)
Physical Therapy Treatment Patient Details Name: Adam Lucas MRN: [id number] DOB: 1950-08-29 Today's Date: 10/22/2017    History of Present Illness Pt is a 67 yo M with a diagnosis of degenerative arthrosis of the L hip and is s/p elective L THA.  PMH includes: HTN, MI, cardiac stents, chronic LBP with surgery, CAD, and neuropathy.     PT Comments    Pt presents with minor deficits in strength, transfers, mobility, gait, and activity tolerance but continues to progress very well towards goals.  Pt able to amb 1 x 150' and 1 x 200' with Mod Ind using a RW with improving gait pattern and with very good stability.  Pt steady ascending and descending 4 steps with B rails with SBA with good carryover regarding proper sequencing.  Pt able to recall 3/3 hip precautions and required only minimal cues during functional mobility for compliance.  Pt will benefit from HHPT services upon discharge to safely address above deficits for decreased caregiver assistance and eventual return to PLOF.     Follow Up Recommendations  Home health PT     Equipment Recommendations       Recommendations for Other Services       Precautions / Restrictions Precautions Precautions: Posterior Hip Precaution Booklet Issued: Yes (comment) Precaution Comments: Pt able to recall 3/3 posterior hip precautions Restrictions Weight Bearing Restrictions: Yes LLE Weight Bearing: Weight bearing as tolerated    Mobility  Bed Mobility               General bed mobility comments: pt up in recliner  Transfers Overall transfer level: Needs assistance Equipment used: Rolling walker (2 wheeled) Transfers: Sit to/from Stand Sit to Stand: Supervision         General transfer comment: Min verbal cues for sequencing  Ambulation/Gait Ambulation/Gait assistance: Modified independent (Device/Increase time) Ambulation Distance (Feet): 200 Feet Assistive device: Rolling walker (2 wheeled) Gait Pattern/deviations:  Step-through pattern;Decreased step length - right;Decreased step length - left     General Gait Details: Improved cadence and BLE step length with good stability   Stairs Stairs: Yes Stairs assistance: Supervision Stair Management: Two rails Number of Stairs: 4 General stair comments: No verbal cues required for sequencing, very good stability and control ascending and descending steps   Wheelchair Mobility    Modified Rankin (Stroke Patients Only)       Balance Overall balance assessment: No apparent balance deficits (not formally assessed)                                          Cognition Arousal/Alertness: Awake/alert Behavior During Therapy: WFL for tasks assessed/performed Overall Cognitive Status: Within Functional Limits for tasks assessed                                        Exercises Total Joint Exercises Ankle Circles/Pumps: Strengthening;Both;10 reps Long Arc Quad: AROM;Both;15 reps Knee Flexion: AROM;Both;15 reps Marching in Standing: AROM;Both;10 reps;Standing Other Exercises Other Exercises: Static and dynamic standing balance with various foot positions and reaching outside BOS Other Exercises: Corner balance exercises HEP education/demonstration provided    General Comments        Pertinent Vitals/Pain Pain Assessment: 0-10 Pain Score: 5  Pain Location: L hip Pain Descriptors / Indicators: Aching;Sore;Operative site guarding Pain Intervention(s):  Premedicated before session    Home Living                      Prior Function            PT Goals (current goals can now be found in the care plan section) Progress towards PT goals: Progressing toward goals    Frequency    BID      PT Plan Current plan remains appropriate    Co-evaluation              AM-PAC PT "6 Clicks" Daily Activity  Outcome Measure                   End of Session Equipment Utilized During  Treatment: Gait belt Activity Tolerance: Patient tolerated treatment well;No increased pain Patient left: in chair;with call bell/phone within reach;with family/visitor present Nurse Communication: Mobility status PT Visit Diagnosis: Other abnormalities of gait and mobility (R26.89);Muscle weakness (generalized) (M62.81)     Time: 0354-6568 PT Time Calculation (min) (ACUTE ONLY): 25 min  Charges:  $Gait Training: 8-22 mins $Therapeutic Exercise: 8-22 mins                    G Codes:       D. Royetta Asal PT, DPT 10/22/17, 11:28 AM

## 2017-12-07 DIAGNOSIS — Z96642 Presence of left artificial hip joint: Secondary | ICD-10-CM | POA: Insufficient documentation

## 2018-11-18 ENCOUNTER — Other Ambulatory Visit: Payer: Self-pay | Admitting: Orthopedic Surgery

## 2018-11-18 DIAGNOSIS — G8929 Other chronic pain: Secondary | ICD-10-CM

## 2018-11-18 DIAGNOSIS — M5442 Lumbago with sciatica, left side: Secondary | ICD-10-CM

## 2018-11-24 ENCOUNTER — Encounter (INDEPENDENT_AMBULATORY_CARE_PROVIDER_SITE_OTHER): Payer: Self-pay

## 2018-11-24 ENCOUNTER — Other Ambulatory Visit: Payer: Self-pay

## 2018-11-24 ENCOUNTER — Ambulatory Visit
Admission: RE | Admit: 2018-11-24 | Discharge: 2018-11-24 | Disposition: A | Discharge status: Home / Self Care | Payer: Medicare Other | Source: Ambulatory Visit | Attending: Orthopedic Surgery | Admitting: Orthopedic Surgery

## 2018-11-24 DIAGNOSIS — G8929 Other chronic pain: Secondary | ICD-10-CM | POA: Diagnosis present

## 2018-11-24 DIAGNOSIS — M5442 Lumbago with sciatica, left side: Secondary | ICD-10-CM | POA: Insufficient documentation

## 2018-11-24 DIAGNOSIS — M5441 Lumbago with sciatica, right side: Secondary | ICD-10-CM | POA: Insufficient documentation

## 2018-12-10 ENCOUNTER — Ambulatory Visit: Payer: BC Managed Care – PPO | Admitting: Cardiology

## 2018-12-14 ENCOUNTER — Other Ambulatory Visit: Payer: Self-pay

## 2018-12-14 ENCOUNTER — Ambulatory Visit (INDEPENDENT_AMBULATORY_CARE_PROVIDER_SITE_OTHER): Payer: Medicare Other | Admitting: Cardiology

## 2018-12-14 ENCOUNTER — Encounter: Payer: Self-pay | Admitting: Cardiology

## 2018-12-14 VITALS — Ht 69.0 in | Wt 195.0 lb

## 2018-12-14 DIAGNOSIS — D751 Secondary polycythemia: Secondary | ICD-10-CM

## 2018-12-14 DIAGNOSIS — I2510 Atherosclerotic heart disease of native coronary artery without angina pectoris: Secondary | ICD-10-CM | POA: Diagnosis not present

## 2018-12-14 DIAGNOSIS — I10 Essential (primary) hypertension: Secondary | ICD-10-CM

## 2018-12-14 DIAGNOSIS — E7800 Pure hypercholesterolemia, unspecified: Secondary | ICD-10-CM | POA: Diagnosis not present

## 2018-12-14 NOTE — Progress Notes (Signed)
Virtual Visit via Video Note: This visit type was conducted due to national recommendations for restrictions regarding the COVID-19 Pandemic (e.g. social distancing).  This format is felt to be most appropriate for this patient at this time.  All issues noted in this document were discussed and addressed.  No physical exam was performed (except for noted visual exam findings with Telehealth visits).  The patient has consented to conduct a Telehealth visit and understands insurance will be billed.   I connected with@, on 12/14/18 at  by a video enabled telemedicine application and verified that I am speaking with the correct person using two identifiers.   I discussed the limitations of evaluation and management by telemedicine and the availability of in person appointments. The patient expressed understanding and agreed to proceed.   I have discussed with patient regarding the safety during COVID Pandemic and steps and precautions to be taken including social distancing, frequent hand wash and use of detergent soap, gels with the patient. I asked the patient to avoid touching mouth, nose, eyes, ears with the hands. I encouraged regular walking around the neighborhood and exercise and regular diet, as long as social distancing can be maintained. Primary Physician/Referring:  Tracie Harrier, MD  Patient ID: Adam Lucas, male    DOB: [birth year], 68 y.o.   MRN: [id number]  Chief Complaint  Patient presents with  . Coronary Artery Disease    1 YEAR F/U     HPI: Adam Lucas  is a 68 y.o. male   coronary artery disease, myocardial infarction and angioplasty to his right coronary artery in 2016 end-stage intervention to the circumflex coronary artery, hypertension, mild hyperlipidemia, depression, degenerative joint disease presents here for follow-up for his annual visit.  Lipids are being managed by his PCP, he states that his lipids are very well controlled on very minimal dose of  pravastatin. He could not tolerate Crestor or Lipitor due to severe myalgias.  He is presently tolerating pravastatin at 10 mg every day.  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  Past Medical History:  Diagnosis Date  . Chronic lower back pain   . Coronary artery disease   . Degenerative arthritis   . Heart murmur ~ 1958  . High cholesterol   . History of kidney stones   . Hypertension   . Kidney stones   . Migraine    "once q year or 2" (08/09/2014)  . Myocardial infarction ([place]) 07/17/2014  . Neuropathy    Past Surgical History:  Procedure Laterality Date  . BACK SURGERY     fusion levels l 3,4,5 s1  . CARPAL TUNNEL RELEASE Right 2015  . COLONOSCOPY WITH PROPOFOL N/A 11/02/2015   Procedure: COLONOSCOPY WITH PROPOFOL;  Surgeon: Manya Silvas, MD;  Location: [facility] ENDOSCOPY;  Service: Endoscopy;  Laterality: N/A;  . CORONARY ANGIOGRAM  08/09/2014   Procedure: CORONARY ANGIOGRAM;  Surgeon: [name], MD;  Location: [facility] CATH LAB;  Service: Cardiovascular;;  . CORONARY ANGIOPLASTY WITH STENT PLACEMENT  07/17/2014; 08/09/2014   "1; 2"  . EXTRACORPOREAL SHOCK WAVE LITHOTRIPSY  2000's X 1  . FRACTURE SURGERY    . HAND LIGAMENT RECONSTRUCTION Right ~ 2013   "thumb"  . HIP SURGERY Left    10/19/17  . KNEE ARTHROSCOPY Left ~ 2002  . LEFT HEART CATHETERIZATION WITH CORONARY ANGIOGRAM N/A 07/17/2014   Procedure: LEFT HEART CATHETERIZATION WITH CORONARY ANGIOGRAM;  Surgeon: [name], MD;  Location: [facility] CATH LAB;  Service: Cardiovascular;  Laterality: N/A;  . ORIF FINGER / THUMB FRACTURE Right ~ 2012  . PERCUTANEOUS CORONARY STENT INTERVENTION (PCI-S)  08/09/2014   Procedure: PERCUTANEOUS CORONARY STENT INTERVENTION (PCI-S);  Surgeon: [name], MD;  Location: [facility] CATH LAB;  Service: Cardiovascular;;  mid circ promus 4x28 mid LAD promus 2.75x12  . POSTERIOR LUMBAR FUSION 4 LEVEL  2005   "L3, 4,  5; S1"  . TONSILLECTOMY AND ADENOIDECTOMY  1970'S  . TOTAL HIP ARTHROPLASTY Left 10/20/2017   Procedure: TOTAL HIP ARTHROPLASTY;  Surgeon: Dereck Leep, MD;  Location: [facility] ORS;  Service: Orthopedics;  Laterality: Left;    Social History   Socioeconomic History  . Marital status: Married    Spouse name: Not on file  . Number of children: Not on file  . Years of education: Not on file  . Highest education level: Not on file  Occupational History  . Not on file  Social Needs  . Financial resource strain: Not on file  . Food insecurity    Worry: Not on file    Inability: Not on file  . Transportation needs    Medical: Not on file    Non-medical: Not on file  Tobacco Use  . Smoking status: Never Smoker  . Smokeless tobacco: Never Used  Substance and Sexual Activity  . Alcohol use: Yes    Comment: 08/09/2014 "I'll have a drink 1-2 times year"  . Drug use: No  . Sexual activity: Never  Lifestyle  . Physical activity    Days per week: Not on file    Minutes per session: Not on file  . Stress: Not on file  Relationships  . Social Herbalist on phone: Not on file    Gets together: Not on file    Attends religious service: Not on file    Active member of club or organization: Not on file    Attends meetings of clubs or organizations: Not on file    Relationship status: Not on file  . Intimate partner violence    Fear of current or ex partner: Not on file    Emotionally abused: Not on file    Physically abused: Not on file    Forced sexual activity: Not on file  Other Topics Concern  . Not on file  Social History Narrative  . Not on file   Review of Systems  Constitution: Negative for chills, decreased appetite, malaise/fatigue and weight gain.  Cardiovascular: Positive for claudication (neurogenic). Negative for dyspnea on exertion, leg swelling and syncope.  Endocrine: Negative for cold intolerance.  Hematologic/Lymphatic: Does not bruise/bleed easily.   Musculoskeletal: Positive for back pain (chronic) and joint pain (chronic DJD). Negative for joint swelling.  Gastrointestinal: Negative for abdominal pain, anorexia, change in bowel habit, hematochezia and melena.  Neurological: Negative for headaches and light-headedness.  Psychiatric/Behavioral: Negative for depression and substance abuse.  All other systems reviewed and are negative.  Objective  Height 5' 9"  (1.753 m), weight 195 lb (88.5 kg). Body mass index is 28.8 kg/m.    Physical exam not performed or limited due to virtual visit.  Patient appeared to be in no distress, Neck was supple, respiration was not labored.  Please see exam details from prior visit is as below.  Physical Exam  Constitutional: He appears well-developed. No distress.  Mildly obese  HENT:  Head: Atraumatic.  Eyes: Conjunctivae are normal.  Neck:  Neck supple. No JVD present. No thyromegaly present.  Cardiovascular: Normal rate, regular rhythm, normal heart sounds and intact distal pulses. Exam reveals no gallop.  No murmur heard. Pulmonary/Chest: Effort normal and breath sounds normal.  Abdominal: Soft. Bowel sounds are normal.  Musculoskeletal: Normal range of motion.  Neurological: He is alert.  Skin: Skin is warm and dry.  Psychiatric: He has a normal mood and affect.   Radiology: No results found.  Laboratory examination:   Labs 09/28/2018: Total cholesterol 140, triglycerides 153, HDL 28, LDL 81.  Hb 18.4/HCT 58.9, microcytic indicis, RDW 17.9.  Platelets 234.  Serum glucose 94 mg, BUN 20, creatinine 1.3, EGFR greater than 60 mL, CMP otherwise normal.  TSH normal.  Labs 12/09/2017: Cholesterol 133, triglycerides 141, HDL 28, LDL 77.  Potassium 4.3, creatinine 1.4, EGFR 51, CMP normal.  CBC normal.  CMP Latest Ref Rng & Units 10/08/2017 08/10/2014 07/18/2014  Glucose 65 - 99 mg/dL 101(H) 128(H) 118(H)  BUN 6 - 20 mg/dL 21(H) 14 13  Creatinine 0.61 - 1.24 mg/dL 1.05 1.12 1.13  Sodium 135 - 145  mmol/L 136 138 136  Potassium 3.5 - 5.1 mmol/L 3.7 4.2 3.9  Chloride 101 - 111 mmol/L 104 105 105  CO2 22 - 32 mmol/L 25 29 25   Calcium 8.9 - 10.3 mg/dL 9.2 8.8 8.9  Total Protein 6.5 - 8.1 g/dL 7.8 - -  Total Bilirubin 0.3 - 1.2 mg/dL 0.4 - -  Alkaline Phos 38 - 126 U/L 62 - -  AST 15 - 41 U/L 21 - -  ALT 17 - 63 U/L 19 - -   CBC Latest Ref Rng & Units 10/08/2017 08/10/2014 07/18/2014  WBC 3.8 - 10.6 K/uL 13.7(H) 7.4 14.0(H)  Hemoglobin 13.0 - 18.0 g/dL 15.6 12.8(L) 13.0  Hematocrit 40.0 - 52.0 % 48.3 40.7 40.6  Platelets 150 - 440 K/uL 237 222 207   Lipid Panel     Component Value Date/Time   CHOL 171 07/17/2014 1830   TRIG 82 07/17/2014 1830   HDL 38 (L) 07/17/2014 1830   CHOLHDL 4.5 07/17/2014 1830   VLDL 16 07/17/2014 1830   LDLCALC 117 (H) 07/17/2014 1830   HEMOGLOBIN A1C No results found for: HGBA1C, MPG TSH No results for input(s): TSH in the last 8760 hours.  Medications   Current Outpatient Medications  Medication Instructions  . acetaminophen (TYLENOL 8 HOUR ARTHRITIS PAIN) 1,300 mg, Oral, 2 times daily  . aspirin EC 81 mg, Oral, Daily  . carvedilol (COREG) 6.25 mg, Oral, 2 times daily with meals  . cholecalciferol (VITAMIN D) 1,000 Units, Oral, Weekly  . DULoxetine (CYMBALTA) 60 mg, Oral, Daily  . enoxaparin (LOVENOX) 40 mg, Subcutaneous, Every 24 hours  . fluticasone (FLONASE) 50 MCG/ACT nasal spray 1 spray, Each Nare, Daily PRN  . gabapentin (NEURONTIN) 300 mg, Oral, 3 times daily  . hydrochlorothiazide (MICROZIDE) 12.5 mg, Oral, Daily  . losartan-hydrochlorothiazide (HYZAAR) 100-12.5 MG tablet 1 tablet, Oral, Daily  . magnesium oxide (MAG-OX) 400 mg, Oral, Daily  . nitroGLYCERIN (NITROSTAT) 0.4 mg, Sublingual, Every 5 min x3 PRN  . pravastatin (PRAVACHOL) 10 mg, Oral, Daily at bedtime  . testosterone cypionate (DEPOTESTOTERONE CYPIONATE) 100 MG/ML injection 1 mL, Intramuscular, Every 14 days  . thiamine (VITAMIN B-1) 100 mg, Oral, Daily  . traMADol  (ULTRAM) 50-100 mg, Oral, Every 4 hours PRN  . Turmeric Curcumin 500 mg, Oral, Daily at bedtime  . vitamin B-12 (CYANOCOBALAMIN) 1,000 mcg, Oral, Daily  . vitamin C (ASCORBIC ACID)  500 mg, Oral, Daily    Cardiac Studies:   Coronary angiogram 07/17/2014: Mid RCA with drug-eluting stent placement for MI and staged PCI 08/09/2014: Midcircumflex 4.0 x 28 mm, mid LAD 2.75 x 12 mm Promus Premier DES stent.  Echocardiogram 09/14/2014: Left ventricle cavity is normal in size. Normal global wall motion. Normal diastolic filling pattern. Calculated EF 60%. The Doppler, spectral and color flow examinations are unremarkable and show no significant flow abnormalities of the aortic, mitral, tricuspid or pulmonic valves.  Assessment   1. Coronary artery disease involving native coronary artery of native heart without angina pectoris   2. HTN (hypertension), benign   3. Hypercholesteremia   4. Erythrocytosis     EKG 12/09/2017: Normal sinus rhythm at rate of 82 bpm, inferior infarct old. Cannot exclude posterior infarct old. No evidence of ischemia. Normal QT interval. No significant change from EKG 10/17/2016.  Recommendations:   Patient presents here for annual visit and follow-up of coronary artery disease, is presently doing well and essentially asymptomatic except for chronic back pain and sciatica and neurogenic claudication.  He is scheduled for further evaluation of the same.  His blood pressure has been well controlled he has not had recent check in the blood pressure.  His lipids are being managed by his PCP and I reviewed them, his labs are relatively well controlled but I would like him to use the most dose of statin if possible, previously he has had statin intolerance due to myalgias.  Now that his vitamin D is being supplemented, he may tolerate higher doses of statins.  He is presently on 10 mg of pravastatin, advised him to increase it to 20 mg in the evening and if he tolerates this for 4 to 6  weeks, he can try to go up to 40 mg every evening.  I would like to see him back in the office in 6 months for follow-up.  I could certainly perform a lipid profile testing prior to his office visit and he is aware to ask [place] about this prior to him coming.  I also noticed that his CBC reveals marked elevation cytosis with marked elevated Hb.  He will discuss this with his PCP.  [name], MD, [facility] 12/14/2018, 3:55 PM [facility] [place] Pager: [phone] Office: [phone] If no answer Cell [phone]

## 2018-12-23 DIAGNOSIS — R29898 Other symptoms and signs involving the musculoskeletal system: Secondary | ICD-10-CM | POA: Insufficient documentation

## 2019-02-08 ENCOUNTER — Telehealth: Payer: Self-pay

## 2019-02-08 NOTE — Telephone Encounter (Signed)
Unable to reach patient, left message to call us for information on his history

## 2019-02-08 NOTE — Progress Notes (Signed)
Patient's Name: Adam Lucas  MRN: [id number]  Referring Provider: Tracie Harrier, MD  DOB: Sep 16, 1950  PCP: Tracie Harrier, MD  DOS: 02/09/2019  Note by: Gillis Santa, MD  Service setting: Ambulatory outpatient  Specialty: Interventional Pain Management  Location: [facility] (AMB) Pain Management Facility  Visit type: Initial Patient Evaluation  Patient type: New Patient   Primary Reason(s) for Visit: Encounter for initial evaluation of one or more chronic problems (new to examiner) potentially causing chronic pain, and posing a threat to normal musculoskeletal function. (Level of risk: High) CC: Leg Pain (bilateral, upper) and Back Pain (lower)  HPI  Mr. Govan is a 68 y.o. year old, male patient, who comes today to see [place] for the first time for an initial evaluation of his chronic pain. 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 Today he comes in for evaluation of his Leg Pain (bilateral, upper) and Back Pain (lower)  Pain Assessment: Location: Lower Back Radiating: pain begins in legs, moves into the lower back Onset: More than a month ago Duration: Chronic pain Quality: Aching, Burning Severity: 6 /10 (subjective, self-reported pain score)  Note: Reported level is compatible with observation.                         When using our objective Pain Scale, levels between 6 and 10/10 are said to belong in an emergency room, as it progressively worsens from a 6/10, described as  severely limiting, requiring emergency care not usually available at an outpatient pain management facility. At a 6/10 level, communication becomes difficult and requires great effort. Assistance to reach the emergency department may be required. Facial flushing and profuse sweating along with potentially dangerous increases in heart rate and blood pressure will be evident. Effect on ADL: difficulty performing daily activities Timing: Constant Modifying factors: sitting, rest BP: 134/84  HR: 88  Onset and Duration: Gradual and Present longer than 3 months Cause of pain: Unknown Severity: Getting worse, NAS-11 at its worse: 8/10, NAS-11 at its best: 4/10, NAS-11 now: 8/10 and NAS-11 on the average: 7/10 Timing: Not influenced by the time of the day and During activity or exercise Aggravating Factors: Bending, Climbing, Kneeling, Lifiting, Motion, Prolonged standing, Squatting, Stooping  and Walking Alleviating Factors: Cold packs, Hot packs and Resting Associated Problems: Sweating, Tingling, Weakness, Pain that wakes patient up and Pain that does not allow patient to sleep Quality of Pain: Aching, Agonizing, Burning, Constant, Disabling, Feeling of constriction, Tingling, Tiring and Uncomfortable Previous Examinations or Tests: CT scan, Discogram, MRI scan, X-rays and Nerve conduction test Previous Treatments: Epidural steroid injections and Physical Therapy  The patient comes into the clinics today for the first time for a chronic pain management evaluation.   Patient is a very pleasant 68 year old male who is being referred by Dr. Cari Caraway for consideration of thoracolumbar spinal cord stimulation.  Of note patient has a history of L3-S1 lumbar fusion with pedicle screws and L5-S1 interbody fusion in 2005 which provided him with good pain relief for approximately 15 years.  Patient is now noticing pain  in his low back and legs.  Primarily his pain is in his anterior thigh and does not radiate  below his knee.  He does notice some weakness of his legs as well as burning and tingling as if they are on his sleep.  He describes his pain as electrical.  Patient has tried left S1 transfemoral ESI's with Dr. Sharlet Salina which were somewhat effective, previous one being 10/07/2017.  Patient also has had an EMG study performed which shows diffuse sensory polyneuropathy.  Surgery has not been recommended.  He has tried various medications including Tylenol, Cymbalta, gabapentin, tramadol, ibuprofen, Lyrica.  Meds   Current Outpatient Medications:  .  acetaminophen (TYLENOL 8 HOUR ARTHRITIS PAIN) 650 MG CR tablet, Take 1,300 mg by mouth 2 (two) times daily., Disp: , Rfl:  .  aspirin EC 81 MG tablet, Take 81 mg by mouth daily., Disp: , Rfl:  .  carvedilol (COREG) 6.25 MG tablet, Take 1 tablet (6.25 mg total) by mouth 2 (two) times daily with a meal., Disp: 60 tablet, Rfl: 1 .  cholecalciferol (VITAMIN D) 1000 units tablet, Take by mouth once a week. , Disp: , Rfl:  .  DULoxetine (CYMBALTA) 60 MG capsule, Take 60 mg by mouth daily. , Disp: , Rfl:  .  ergocalciferol (VITAMIN D2) 1.25 MG (50000 UT) capsule, Take 50,000 Units by mouth once a week., Disp: , Rfl:  .  fluticasone (FLONASE) 50 MCG/ACT nasal spray, Place 1 spray into both nostrils daily as needed for allergies., Disp: , Rfl:  .  gabapentin (NEURONTIN) 100 MG capsule, Take 300 mg by mouth 3 (three) times daily. , Disp: , Rfl:  .  hydrochlorothiazide (MICROZIDE) 12.5 MG capsule, Take 12.5 mg by mouth daily., Disp: , Rfl:  .  losartan-hydrochlorothiazide (HYZAAR) 100-12.5 MG tablet, Take 1 tablet by mouth daily., Disp: , Rfl:  .  magnesium oxide (MAG-OX) 400 MG tablet, Take 400 mg by mouth daily., Disp: , Rfl:  .  nitroGLYCERIN (NITROSTAT) 0.4 MG SL tablet, Place 1 tablet (0.4 mg total) under the tongue every 5 (five) minutes x 3 doses as needed for chest pain., Disp: 25 tablet, Rfl: 4 .  pravastatin (PRAVACHOL) 10 MG tablet, Take 10 mg by mouth at  bedtime., Disp: , Rfl: 1 .  testosterone cypionate (DEPOTESTOTERONE CYPIONATE) 100 MG/ML injection, Inject 1 mL into the muscle every 21 ( twenty-one) days. , Disp: , Rfl: 0 .  thiamine (VITAMIN B-1) 100 MG tablet, Take 100 mg by mouth daily., Disp: , Rfl:  .  traMADol (ULTRAM) 50 MG tablet, Take 1-2 tablets (50-100 mg total) by mouth every 4 (four) hours as needed for moderate pain., Disp: 60 tablet, Rfl: 0 .  Turmeric Curcumin 500 MG CAPS, Take 500 mg by mouth at bedtime., Disp: , Rfl:  .  vitamin B-12 (CYANOCOBALAMIN) 500 MCG tablet, Take 1,000 mcg by mouth daily. , Disp: , Rfl:  .  vitamin C (ASCORBIC ACID) 500 MG tablet, Take 500 mg by mouth daily., Disp: , Rfl:  .  enoxaparin (LOVENOX) 40 MG/0.4ML injection, Inject 0.4 mLs (40 mg total) into the skin daily for 14 days., Disp: 14 Syringe, Rfl: 0  Imaging Review    Lumbosacral Imaging: Lumbar MR wo contrast:  Results for orders placed during the hospital encounter of 05/05/17  MR LUMBAR SPINE WO CONTRAST   Narrative CLINICAL DATA:  Progressive low back and left leg pain since surgery in 2005. No known injury.  EXAM: MRI LUMBAR SPINE WITHOUT CONTRAST  TECHNIQUE:  Multiplanar, multisequence MR imaging of the lumbar spine was performed. No intravenous contrast was administered.  COMPARISON:  Lumbar MRI 05/27/2011  FINDINGS: Segmentation: Conventional anatomy assumed, with the last open disc space designated L5-S1.  Alignment: Stable. There is a minimal degenerative anterolisthesis at the L4-5 and L5-S1 levels.  Vertebrae: No worrisome osseous lesion, acute fracture or pars defect. Status post laminectomies with pedicle screw and rod fusion from L3 through S1. The visualized sacroiliac joints appear unremarkable.  Conus medullaris: Extends to the L1-2 level and appears normal. Thin fatty filum noted.  Paraspinal and other soft tissues: No significant paraspinal findings.  Disc levels:  T11-12: Mildly progressive loss of  disc height with annular disc bulging. No cord deformity or high-grade foraminal narrowing.  T12-L1: Mild disc bulging. No spinal stenosis or nerve root encroachment.  L1-2:  No significant findings.  L2-3: The disc appears normal. Mild bilateral facet hypertrophy. No spinal stenosis or nerve root encroachment.  L3-4: The spinal canal remains well decompressed by the previous laminectomies. The foramina are patent.  L4-5: Stable postsurgical changes status post laminectomy and PLIF. No recurrent spinal stenosis or nerve root encroachment.  L5-S1: Susceptibility artifact from the interbody spacer off midline to the right results in limited assessment of the right foramen. However, the appearance is unchanged. No evidence of left-sided nerve root encroachment.  IMPRESSION: 1. No acute findings or explanation for the patient's symptoms. No left-sided nerve root encroachment identified. 2. Postsurgical changes from previous L3 through S1 laminectomy and PLIF. Interbody spacer at L5-S1 is off midline to the right, creating artifact in the right foramen. Appearance is unchanged. 3. Progressive disc degeneration and bulging at T11-12 without cord deformity.   Electronically Signed   By: Richardean Sale M.D.   On: 05/05/2017 17:13     Lumbar CT wo contrast:  Results for orders placed during the hospital encounter of 11/24/18  CT LUMBAR SPINE WO CONTRAST   Narrative CLINICAL DATA:  History of disc replacement surgery with fusion. Now with BILATERAL thigh pain.  EXAM: CT LUMBAR SPINE WITHOUT CONTRAST  TECHNIQUE: Multidetector CT imaging of the lumbar spine was performed without intravenous contrast administration. Multiplanar CT image reconstructions were also generated.  COMPARISON:  MRI lumbar spine 05/05/2017.  FINDINGS: Segmentation: Standard  Alignment: Slight straightening of the normal lumbar lordosis across the fusion segments. No traumatic  subluxation.  Vertebrae: Status post L3 through S1 pedicle screw fusion. Posterior interbody arthrodesis is established from L3 through L5. There is an interbody cage at L5-S1 and there is solid anterior arthrodesis across this interspace despite the fact that there is lucency around the cage as well as the RIGHT S1 screw.  Paraspinal and other soft tissues: Aortic atherosclerosis. BILATERAL nephrolithiasis, much greater on the LEFT, developing staghorn calculus.  Disc levels:  L1-L2:  Unremarkable.  L2-L3: Mild facet arthropathy, but no visible protrusion or stenosis. No significant foraminal narrowing.  L3-L4:  Solid arthrodesis. No definite impingement.  L4-L5:  Solid arthrodesis. No definite impingement.  L5-S1:  Solid arthrodesis. No definite impingement.  Compared with prior MR, good general agreement.  IMPRESSION: 1. Solid arthrodesis L3 through S1. 2. No residual impingement across the fusion segments nor is there apparent adjacent segment disease. 3. BILATERAL nephrolithiasis, much greater on the LEFT, developing staghorn calculus.   Electronically Signed   By: Staci Righter M.D.   On: 11/24/2018 14:48     Results for orders placed in visit on 01/14/03  CT Lumbar Spine W Contrast   Narrative  FINDINGS CLINICAL DATA:  LOW BACK PAIN WHICH RADIATES TO THE RIGHT POSTERIOR HIP REGION OCCASIONALLY LOWER INTO THE RIGHT LEG.  DEGENERATIVE DISK DISEASE AT THE LOWER THREE LEVELS BY MR. DIAGNOSTIC LUMBAR DISK INJECTION THE PROCEDURE WAS DISCUSSED IN DEPTH WITH THE PATIENT INCLUDING THE POTENTIAL RISK OF INFECTION. HE RECEIVED 1 GRAM OF ANCEF INTRAVENOUSLY PRIOR TO THE PROCEDURE WITH A SMALL AMOUNT WITHDRAWN AND ADDED TO THE CONTRAST FOR INJECTION.  HE RECEIVED 1 MG OF VERSED INTRAVENOUSLY PRIOR TO THE PROCEDURE.  HE WAS PLACED PRONE ON THE FLUOROSCOPIC TABLET.  A BETADINE SCRUB TO THE LOW LEFT BACK WAS PERFORMED.  SKIN ANESTHESIA WAS CARRIED OUT USING 1% LIDOCAINE.  15  CM 22 GAUGE CHIBA NEEDLES WERE INTRODUCED INTO THE NUCLEAR REGIONS AT L3-4, L4-5, AND L5-S1.  CONTRAST MIXED WITH ANCEF WAS INJECTED USING A PRESSURE MONITORING SYRINGE.  SPOT RADIOGRAPHS WERE TAKEN.  THE PATIENT'S SYMPTOMS WERE RECORDED.  FOLLOWING THE PROCEDURE, HE WAS TREATED WITH INTRAVENOUS FENTANYL AND TORADOL FOR PAIN RELIEF.  HE WAS TAKEN TO CT IN GOOD CONDITION.  HE WAS MONITORED FOR AN HOUR AND THEN DISCHARGED TO HOME. L3-4:  OPENING PRESSURE WAS 5 PSI.  THE DISK ACCOMMODATED 4 CC OF CONTRAST WITH ELEVATION OF INTRADISKAL PRESSURE ONLY TO 40 PSI.  THIS IS A DIFFUSELY DISRUPTED DISK WITH CONTRAST SPREADING THROUGHOUT THE DISK SPACE AND INTO THE VENTRAL EPIDURAL AREA.  THE PATIENT FELT CONCORDANT PAIN ACROSS HIS LOW BACK AND A LOT OF PRESSURE.  HE FELT LIKE THIS IS PART OF HIS HOME PAIN SYNDROME. L4-5:  THERE ARE SIMILAR FINDINGS AT THIS LEVEL.  OPENING PRESSURE WAS 5 PSI.  THE DISK ACCOMMODATED 3.5 CC OF CONTRAST WITH ELEVATION OF INTRADISKAL PRESSURE TO 60 PSI.  THIS IS ALSO A DIFFUSELY DISRUPTED DISK WITH CONTRAST SPREADING THROUGHOUT THE DISK SPACE.  THE PATIENT AGAIN FELT CONCORDANT PAIN ACROSS HIS LOW BACK. L5-S1:  OPENING PRESSURE WAS 10 PSI.  THE DISK ACCOMMODATED 3 CC OF CONTRAST WITH ELEVATION OF INTRADISKAL PRESSURE TO 50 PSI.  THERE IS INTERNAL DISK DISRUPTION WITH EXTENSIVE ANNULAR TEARING, MOST PROMINENT IN THE RIGHT POSTEROLATERAL DIRECTION.  THE PATIENT FELT HIS FAMILIAR LOW BACK PAIN AND HIS FAMILIAR STABBING PAIN IN THE RIGHT HIP WITH THIS INJECTION. IMPRESSION DIFFUSELY DISRUPTED DISKS AT L3-4, L4-5, AND L5-S1 WITH CONCORDANT SYMPTOMATOLOGY AT [facility] LEVEL. POST DISKOGRAM CT SCAN SPIRAL SCANNING WAS PERFORMED FROM L2 TO S1. AXIAL REFORMATIONS WERE MADE THROUGH EACH DISK SPACE. L3-4:  THIS IS A DIFFUSELY DISRUPTED DISK WITH CIRCUMFERENTIAL ANNULAR TEARING.  THERE IS EXTRUSION OF CONTRAST INTO THE VENTRAL EPIDURAL SPACE.  THE DISK PROTRUDES MILDLY IN A DIFFUSE FASHION,  BUT THERE DOES NOT APPEAR TO BE COMPRESSIVE STENOSIS OF THE CANAL OR NEURAL FORAMINA. L4-5:  THIS IS A DIFFUSELY DISRUPTED DISK WITH CIRCUMFERENTIAL ANNULAR TEARING. THERE IS EXTRUSION INTO THE INTERVERTEBRAL FORAMEN ON THE RIGHT AND INTO THE VENTRAL EPIDURAL SPACE.  THERE IS MILD NEURAL FORAMINAL ENCROACHMENT ON THE RIGHT AND MILD RIGHT LATERAL RECESS NARROWING. L5-S1:  THIS IS A DIFFUSELY DISRUPTED DISK WITH CIRCUMFERENTIAL ANNULAR TEARING, MOST PROMINENT IN THE RIGHT POSTEROLATERAL DIRECTION.  THE DISK PROTRUDES MILDLY IN A DIFFUSE FASHION.  THERE IS NO DEFINITE COMPRESSIVE STENOSIS. IMPRESSION DIFFUSELY DISRUPTED DISKS AT L3-4, L4-5 AND L5-S1, AS DESCRIBED. CT MULTIPLANAR REFORMATIONS SAGITTAL REFORMATIONS WERE DONE WHICH AIDE IN DEPICTION OF THE ABOVE DESCRIBED FINDINGS.    Lumbar DG Diskogram views:  Results for orders placed in visit on 01/14/03  DG Diskogram Lumbar   Narrative FINDINGS CLINICAL DATA:  LOW BACK  PAIN WHICH RADIATES TO THE RIGHT POSTERIOR HIP REGION OCCASIONALLY LOWER INTO THE RIGHT LEG.  DEGENERATIVE DISK DISEASE AT THE LOWER THREE LEVELS BY MR. DIAGNOSTIC LUMBAR DISK INJECTION THE PROCEDURE WAS DISCUSSED IN DEPTH WITH THE PATIENT INCLUDING THE POTENTIAL RISK OF INFECTION. HE RECEIVED 1 GRAM OF ANCEF INTRAVENOUSLY PRIOR TO THE PROCEDURE WITH A SMALL AMOUNT WITHDRAWN AND ADDED TO THE CONTRAST FOR INJECTION.  HE RECEIVED 1 MG OF VERSED INTRAVENOUSLY PRIOR TO THE PROCEDURE.  HE WAS PLACED PRONE ON THE FLUOROSCOPIC TABLET.  A BETADINE SCRUB TO THE LOW LEFT BACK WAS PERFORMED.  SKIN ANESTHESIA WAS CARRIED OUT USING 1% LIDOCAINE.  15 CM 22 GAUGE CHIBA NEEDLES WERE INTRODUCED INTO THE NUCLEAR REGIONS AT L3-4, L4-5, AND L5-S1.  CONTRAST MIXED WITH ANCEF WAS INJECTED USING A PRESSURE MONITORING SYRINGE.  SPOT RADIOGRAPHS WERE TAKEN.  THE PATIENT'S SYMPTOMS WERE RECORDED.  FOLLOWING THE PROCEDURE, HE WAS TREATED WITH INTRAVENOUS FENTANYL AND TORADOL FOR PAIN RELIEF.  HE WAS  TAKEN TO CT IN GOOD CONDITION.  HE WAS MONITORED FOR AN HOUR AND THEN DISCHARGED TO HOME. L3-4:  OPENING PRESSURE WAS 5 PSI.  THE DISK ACCOMMODATED 4 CC OF CONTRAST WITH ELEVATION OF INTRADISKAL PRESSURE ONLY TO 40 PSI.  THIS IS A DIFFUSELY DISRUPTED DISK WITH CONTRAST SPREADING THROUGHOUT THE DISK SPACE AND INTO THE VENTRAL EPIDURAL AREA.  THE PATIENT FELT CONCORDANT PAIN ACROSS HIS LOW BACK AND A LOT OF PRESSURE.  HE FELT LIKE THIS IS PART OF HIS HOME PAIN SYNDROME. L4-5:  THERE ARE SIMILAR FINDINGS AT THIS LEVEL.  OPENING PRESSURE WAS 5 PSI.  THE DISK ACCOMMODATED 3.5 CC OF CONTRAST WITH ELEVATION OF INTRADISKAL PRESSURE TO 60 PSI.  THIS IS ALSO A DIFFUSELY DISRUPTED DISK WITH CONTRAST SPREADING THROUGHOUT THE DISK SPACE.  THE PATIENT AGAIN FELT CONCORDANT PAIN ACROSS HIS LOW BACK. L5-S1:  OPENING PRESSURE WAS 10 PSI.  THE DISK ACCOMMODATED 3 CC OF CONTRAST WITH ELEVATION OF INTRADISKAL PRESSURE TO 50 PSI.  THERE IS INTERNAL DISK DISRUPTION WITH EXTENSIVE ANNULAR TEARING, MOST PROMINENT IN THE RIGHT POSTEROLATERAL DIRECTION.  THE PATIENT FELT HIS FAMILIAR LOW BACK PAIN AND HIS FAMILIAR STABBING PAIN IN THE RIGHT HIP WITH THIS INJECTION. IMPRESSION DIFFUSELY DISRUPTED DISKS AT L3-4, L4-5, AND L5-S1 WITH CONCORDANT SYMPTOMATOLOGY AT [facility] LEVEL. POST DISKOGRAM CT SCAN SPIRAL SCANNING WAS PERFORMED FROM L2 TO S1. AXIAL REFORMATIONS WERE MADE THROUGH EACH DISK SPACE. L3-4:  THIS IS A DIFFUSELY DISRUPTED DISK WITH CIRCUMFERENTIAL ANNULAR TEARING.  THERE IS EXTRUSION OF CONTRAST INTO THE VENTRAL EPIDURAL SPACE.  THE DISK PROTRUDES MILDLY IN A DIFFUSE FASHION, BUT THERE DOES NOT APPEAR TO BE COMPRESSIVE STENOSIS OF THE CANAL OR NEURAL FORAMINA. L4-5:  THIS IS A DIFFUSELY DISRUPTED DISK WITH CIRCUMFERENTIAL ANNULAR TEARING. THERE IS EXTRUSION INTO THE INTERVERTEBRAL FORAMEN ON THE RIGHT AND INTO THE VENTRAL EPIDURAL SPACE.  THERE IS MILD NEURAL FORAMINAL ENCROACHMENT ON THE RIGHT AND MILD RIGHT  LATERAL RECESS NARROWING. L5-S1:  THIS IS A DIFFUSELY DISRUPTED DISK WITH CIRCUMFERENTIAL ANNULAR TEARING, MOST PROMINENT IN THE RIGHT POSTEROLATERAL DIRECTION.  THE DISK PROTRUDES MILDLY IN A DIFFUSE FASHION.  THERE IS NO DEFINITE COMPRESSIVE STENOSIS. IMPRESSION DIFFUSELY DISRUPTED DISKS AT L3-4, L4-5 AND L5-S1, AS DESCRIBED. CT MULTIPLANAR REFORMATIONS SAGITTAL REFORMATIONS WERE DONE WHICH AIDE IN DEPICTION OF THE ABOVE DESCRIBED FINDINGS.   Complexity Note: Imaging results reviewed. Results shared with Mr. Zehner, using Layman's terms.  ROS  Cardiovascular: Heart trouble, Daily Aspirin intake, High blood pressure, Heart attack ( Date: 2015), Heart murmur, Heart catheterization and Needs antibiotics prior to dental procedures Pulmonary or Respiratory: Snoring  Neurological: No reported neurological signs or symptoms such as seizures, abnormal skin sensations, urinary and/or fecal incontinence, being born with an abnormal open spine and/or a tethered spinal cord Review of Past Neurological Studies: No results found for this or any previous visit. Psychological-Psychiatric: No reported psychological or psychiatric signs or symptoms such as difficulty sleeping, anxiety, depression, delusions or hallucinations (schizophrenial), mood swings (bipolar disorders) or suicidal ideations or attempts Gastrointestinal: No reported gastrointestinal signs or symptoms such as vomiting or evacuating blood, reflux, heartburn, alternating episodes of diarrhea and constipation, inflamed or scarred liver, or pancreas or irrregular and/or infrequent bowel movements Genitourinary: No reported renal or genitourinary signs or symptoms such as difficulty voiding or producing urine, peeing blood, non-functioning kidney, kidney stones, difficulty emptying the bladder, difficulty controlling the flow of urine, or chronic kidney disease Hematological: No reported hematological signs or symptoms such as  prolonged bleeding, low or poor functioning platelets, bruising or bleeding easily, hereditary bleeding problems, low energy levels due to low hemoglobin or being anemic Endocrine: No reported endocrine signs or symptoms such as high or low blood sugar, rapid heart rate due to high thyroid levels, obesity or weight gain due to slow thyroid or thyroid disease Rheumatologic: Rheumatoid arthritis Musculoskeletal: Negative for myasthenia gravis, muscular dystrophy, multiple sclerosis or malignant hyperthermia Work History: Retired  Allergies  Mr. Hisaw is allergic to atorvastatin and crestor [rosuvastatin calcium].  Laboratory Chemistry Profile   Screening Lab Results  Component Value Date   STAPHAUREUS NEGATIVE 10/08/2017   MRSAPCR NEGATIVE 10/08/2017    Inflammation (CRP: Acute Phase) (ESR: Chronic Phase) Lab Results  Component Value Date   CRP <0.8 10/08/2017   ESRSEDRATE 1 10/08/2017                         Renal Lab Results  Component Value Date   BUN 21 (H) 10/08/2017   CREATININE 1.05 10/08/2017   GFRAA >60 10/08/2017   GFRNONAA >60 10/08/2017                             Hepatic Lab Results  Component Value Date   AST 21 10/08/2017   ALT 19 10/08/2017   ALBUMIN 4.5 10/08/2017   ALKPHOS 62 10/08/2017                        Electrolytes Lab Results  Component Value Date   NA 136 10/08/2017   K 3.7 10/08/2017   CL 104 10/08/2017   CALCIUM 9.2 10/08/2017                         Coagulation Lab Results  Component Value Date   INR 0.95 10/08/2017   LABPROT 12.6 10/08/2017   APTT 28 10/08/2017   PLT 237 10/08/2017                        Cardiovascular Lab Results  Component Value Date   CKMB 131.9 (H) 07/17/2014   TROPONINI 41.08 (HH) 07/18/2014   HGB 15.6 10/08/2017   HCT 48.3 10/08/2017  ID Lab Results  Component Value Date   STAPHAUREUS NEGATIVE 10/08/2017   MRSAPCR NEGATIVE 10/08/2017    Cancer No results found for:  CEA, CA125, LABCA2                      Endocrine Lab Results  Component Value Date   TSH 3.322 07/17/2014                        Note: Lab results reviewed.  [place]  Drug: Mr. Hilyard  reports no history of drug use. Alcohol:  reports current alcohol use. Tobacco:  reports that he has never smoked. He has never used smokeless tobacco. Medical:  has a past medical history of Chronic lower back pain, Coronary artery disease, Degenerative arthritis, Heart murmur (~ 1958), High cholesterol, History of kidney stones, Hypertension, Kidney stones, Migraine, Myocardial infarction ([place]) (07/17/2014), and Neuropathy. Family: family history includes Deep vein thrombosis in his mother; Heart attack in his father; Parkinson's disease in his father.  Past Surgical History:  Procedure Laterality Date  . BACK SURGERY     fusion levels l 3,4,5 s1  . CARPAL TUNNEL RELEASE Right 2015  . COLONOSCOPY WITH PROPOFOL N/A 11/02/2015   Procedure: COLONOSCOPY WITH PROPOFOL;  Surgeon: Manya Silvas, MD;  Location: [facility] ENDOSCOPY;  Service: Endoscopy;  Laterality: N/A;  . CORONARY ANGIOGRAM  08/09/2014   Procedure: CORONARY ANGIOGRAM;  Surgeon: [name], MD;  Location[facility] CATH LAB;  Service: Cardiovascular;;  . CORONARY ANGIOPLASTY WITH STENT PLACEMENT  07/17/2014; 08/09/2014   "1; 2"  . EXTRACORPOREAL SHOCK WAVE LITHOTRIPSY  2000's X 1  . FRACTURE SURGERY    . HAND LIGAMENT RECONSTRUCTION Right ~ 2013   "thumb"  . HIP SURGERY Left    10/19/17  . KNEE ARTHROSCOPY Left ~ 2002  . LEFT HEART CATHETERIZATION WITH CORONARY ANGIOGRAM N/A 07/17/2014   Procedure: LEFT HEART CATHETERIZATION WITH CORONARY ANGIOGRAM;  Surgeon: [name], MD;  Location: [facility] CATH LAB;  Service: Cardiovascular;  Laterality: N/A;  . ORIF FINGER / THUMB FRACTURE Right ~ 2012  . PERCUTANEOUS CORONARY STENT INTERVENTION (PCI-S)  08/09/2014   Procedure: PERCUTANEOUS CORONARY STENT INTERVENTION (PCI-S);  Surgeon: [name], MD;   Location: [facility] CATH LAB;  Service: Cardiovascular;;  mid circ promus [id number] mid LAD promus [id number]  . POSTERIOR LUMBAR FUSION 4 LEVEL  2005   "L3, 4, 5; S1"  . TONSILLECTOMY AND ADENOIDECTOMY  1970'S  . TOTAL HIP ARTHROPLASTY Left 10/20/2017   Procedure: TOTAL HIP ARTHROPLASTY;  Surgeon: Dereck Leep, MD;  Location: [facility] ORS;  Service: Orthopedics;  Laterality: Left;   Active Ambulatory Problems    Diagnosis Date Noted  . Post-infarction angina ([place]) 07/17/2014  . CAD (coronary artery disease), native coronary artery 07/17/2014  . Acute MI, inferior wall, initial episode of care ([place]) 07/17/2014  . CAD (coronary artery disease) 08/09/2014  . Carpal tunnel syndrome 10/20/2017  . Chronic back pain 12/18/2013  . ED (erectile dysfunction) 12/18/2013  . HTN (hypertension), benign 12/18/2013  . Hyperglycemia, unspecified 12/31/2013  . Status post total replacement of hip 10/20/2017  . Bilateral leg weakness 12/23/2018  . Status post total replacement of left hip 12/07/2017  . Polyneuropathy 02/09/2019  . Lumbar post-laminectomy syndrome 02/09/2019  . History of lumbar fusion (L3-S1) 02/09/2019  . Chronic radicular lumbar pain 02/09/2019  . Chronic pain syndrome 02/09/2019   Resolved Ambulatory Problems    Diagnosis Date Noted  .  No Resolved Ambulatory Problems   Past Medical History:  Diagnosis Date  . Chronic lower back pain   . Coronary artery disease   . Degenerative arthritis   . Heart murmur ~ 1958  . High cholesterol   . History of kidney stones   . Hypertension   . Kidney stones   . Migraine   . Myocardial infarction ([place]) 07/17/2014  . Neuropathy    Constitutional Exam  General appearance: Well nourished, well developed, and well hydrated. In no apparent acute distress Vitals:   02/09/19 0849  BP: 134/84  Pulse: 88  Resp: 18  Temp: 98.1 F (36.7 C)  TempSrc: Oral  SpO2: 100%  Weight: 205 lb (93 kg)  Height: 5' 9"  (1.753 m)   BMI Assessment: Estimated body  mass index is 30.27 kg/m as calculated from the following:   Height as of this encounter: 5' 9"  (1.753 m).   Weight as of this encounter: 205 lb (93 kg).  BMI interpretation table: BMI level Category Range association with higher incidence of chronic pain  <18 kg/m2 Underweight   18.5-24.9 kg/m2 Ideal body weight   25-29.9 kg/m2 Overweight Increased incidence by 20%  30-34.9 kg/m2 Obese (Class I) Increased incidence by 68%  35-39.9 kg/m2 Severe obesity (Class II) Increased incidence by 136%  >40 kg/m2 Extreme obesity (Class III) Increased incidence by 254%   Patient's current BMI Ideal Body weight  Body mass index is 30.27 kg/m. Ideal body weight: 70.7 kg (155 lb 13.8 oz) Adjusted ideal body weight: 79.6 kg (175 lb 8.3 oz)   BMI Readings from Last 4 Encounters:  02/09/19 30.27 kg/m  12/14/18 28.80 kg/m  10/20/17 30.72 kg/m  10/08/17 30.71 kg/m   Wt Readings from Last 4 Encounters:  02/09/19 205 lb (93 kg)  12/14/18 195 lb (88.5 kg)  10/20/17 208 lb (94.3 kg)  10/08/17 211 lb (95.7 kg)  Psych/Mental status: Alert, oriented x 3 (person, place, & time)       Eyes: PERLA Respiratory: No evidence of acute respiratory distress  Cervical Spine Area Exam  Skin & Axial Inspection: No masses, redness, edema, swelling, or associated skin lesions Alignment: Symmetrical Functional ROM: Unrestricted ROM      Stability: No instability detected Muscle Tone/Strength: Functionally intact. No obvious neuro-muscular anomalies detected. Sensory (Neurological): Unimpaired Palpation: No palpable anomalies              Upper Extremity (UE) Exam    Side: Right upper extremity  Side: Left upper extremity  Skin & Extremity Inspection: Skin color, temperature, and hair growth are WNL. No peripheral edema or cyanosis. No masses, redness, swelling, asymmetry, or associated skin lesions. No contractures.  Skin & Extremity Inspection: Skin color, temperature, and hair growth are WNL. No peripheral  edema or cyanosis. No masses, redness, swelling, asymmetry, or associated skin lesions. No contractures.  Functional ROM: Unrestricted ROM          Functional ROM: Unrestricted ROM          Muscle Tone/Strength: Functionally intact. No obvious neuro-muscular anomalies detected.  Muscle Tone/Strength: Functionally intact. No obvious neuro-muscular anomalies detected.  Sensory (Neurological): Unimpaired          Sensory (Neurological): Unimpaired          Palpation: No palpable anomalies              Palpation: No palpable anomalies              Provocative Test(s):  Phalen's test: deferred Tinel's  test: deferred Apley's scratch test (touch opposite shoulder):  Action 1 (Across chest): deferred Action 2 (Overhead): deferred Action 3 (LB reach): deferred   Provocative Test(s):  Phalen's test: deferred Tinel's test: deferred Apley's scratch test (touch opposite shoulder):  Action 1 (Across chest): deferred Action 2 (Overhead): deferred Action 3 (LB reach): deferred    Thoracic Spine Area Exam  Skin & Axial Inspection: No masses, redness, or swelling Alignment: Symmetrical Functional ROM: Unrestricted ROM Stability: No instability detected Muscle Tone/Strength: Functionally intact. No obvious neuro-muscular anomalies detected. Sensory (Neurological): Unimpaired Muscle strength & Tone: No palpable anomalies  Lumbar Spine Area Exam  Skin & Axial Inspection: Well healed scar from previous spine surgery detected Alignment: Symmetrical Functional ROM: Decreased ROM affecting both sides Stability: No instability detected Muscle Tone/Strength: Functionally intact. No obvious neuro-muscular anomalies detected. Sensory (Neurological): Dermatomal pain pattern bilateral, right greater than left Palpation: No palpable anomalies       Provocative Tests: Hyperextension/rotation test: deferred today       Lumbar quadrant test (Kemp's test): (+) bilateral for foraminal stenosis Lateral bending  test: (+) ipsilateral radicular pain, bilaterally. Positive for bilateral foraminal stenosis. Patrick's Maneuver: deferred today                   FABER* test: deferred today                   S-I anterior distraction/compression test: deferred today         S-I lateral compression test: deferred today         S-I Thigh-thrust test: deferred today         S-I Gaenslen's test: deferred today         *(Flexion, ABduction and External Rotation)  Gait & Posture Assessment  Ambulation: Unassisted Gait: Relatively normal for age and body habitus Posture: WNL   Lower Extremity Exam    Side: Right lower extremity  Side: Left lower extremity  Stability: No instability observed          Stability: No instability observed          Skin & Extremity Inspection: Skin color, temperature, and hair growth are WNL. No peripheral edema or cyanosis. No masses, redness, swelling, asymmetry, or associated skin lesions. No contractures.  Skin & Extremity Inspection: Skin color, temperature, and hair growth are WNL. No peripheral edema or cyanosis. No masses, redness, swelling, asymmetry, or associated skin lesions. No contractures.  Functional ROM: Pain restricted ROM for hip and knee joints          Functional ROM: Pain restricted ROM for hip and knee joints          Muscle Tone/Strength: Functionally intact. No obvious neuro-muscular anomalies detected.  Muscle Tone/Strength: Functionally intact. No obvious neuro-muscular anomalies detected.  Sensory (Neurological): Neuropathic pain pattern        Sensory (Neurological): Neuropathic pain pattern        DTR: Patellar: 0: absent Achilles: 0: absent Plantar: deferred today  DTR: Patellar: 1+: trace Achilles: 0: absent Plantar: deferred today  Palpation: No palpable anomalies  Palpation: No palpable anomalies   Assessment  Primary Diagnosis & Pertinent Problem List: The primary encounter diagnosis was Polyneuropathy. Diagnoses of Lumbar post-laminectomy  syndrome, History of lumbar fusion (L3-S1), Chronic radicular lumbar pain, Chronic pain syndrome, Other intervertebral disc degeneration, lumbar region, and Radicular pain of thoracic region were also pertinent to this visit.  Visit Diagnosis (New problems to examiner): 1. Polyneuropathy   2. Lumbar post-laminectomy syndrome  3. History of lumbar fusion (L3-S1)   4. Chronic radicular lumbar pain   5. Chronic pain syndrome   6. Other intervertebral disc degeneration, lumbar region   7. Radicular pain of thoracic region    I had extensive discussion with the patient regarding therapeutic options.  Given that the patient has obtained mild benefit with previous transforaminal epidural steroid injections, we discussed repeating a caudal epidural steroid injection to see if it helps out with his pain symptoms.  Furthermore we had an extensive discussion about thoracolumbar spinal cord stimulation, its risks and benefits.  For procedural planning, I would like to obtain an MRI of the patient's thoracic and lumbar spine.  I would like to ensure that the patient does not have thoracic canal stenosis which would preclude safe placement of percutaneous electrodes and or paddle.  Of note he has been over 2 years since the patient has had lumbar MRI and for procedural planning would like to obtain a lumbar MRI as well.  We will refer the patient to psychology for SCS eval.  I have provided the patient with resources regarding spinal cord stimulation via [employer] and [employer].  Hopefully will have a discussion about which one he wants to go with after he has completed his thoracic and lumbar spine MRI and seen psychologist for implant eval.  Plan of Care (Initial workup plan)   Imaging Orders     MR LUMBAR SPINE [place]  Referral Orders     Ambulatory referral to Psychology  Procedure Orders     Caudal Epidural Injection   Interventional management  options: Mr. Tagle was informed that there is no guarantee that he would be a candidate for interventional therapies. The decision will be based on the results of diagnostic studies, as well as Mr. Sangha risk profile.  Procedure(s) under consideration:  Caudal epidural steroid injection Thoracolumbar spinal cord stimulation   Provider-requested follow-up: Return in about 1 week (around 02/16/2019) for Procedure CAUDAL ESI asap.  Future Appointments  Date Time Provider [place]  02/17/2019  8:45 AM Gillis Santa, MD [facility] None  06/04/2019  9:00 AM [name], MD [facility] None    Primary Care Physician: Tracie Harrier, MD Location: [facility] Note by: Gillis Santa, MD Date: 02/09/2019; Time: 4:28 PM  Note: This dictation was prepared with Dragon dictation. Any transcriptional errors that may result from this process are unintentional.

## 2019-02-09 ENCOUNTER — Encounter: Payer: Self-pay | Admitting: Student in an Organized Health Care Education/Training Program

## 2019-02-09 ENCOUNTER — Ambulatory Visit
Payer: Medicare Other | Attending: Student in an Organized Health Care Education/Training Program | Admitting: Student in an Organized Health Care Education/Training Program

## 2019-02-09 ENCOUNTER — Other Ambulatory Visit: Payer: Self-pay

## 2019-02-09 VITALS — BP 134/84 | HR 88 | Temp 98.1°F | Resp 18 | Ht 69.0 in | Wt 205.0 lb

## 2019-02-09 DIAGNOSIS — M961 Postlaminectomy syndrome, not elsewhere classified: Secondary | ICD-10-CM

## 2019-02-09 DIAGNOSIS — G894 Chronic pain syndrome: Secondary | ICD-10-CM | POA: Diagnosis present

## 2019-02-09 DIAGNOSIS — M5414 Radiculopathy, thoracic region: Secondary | ICD-10-CM | POA: Diagnosis present

## 2019-02-09 DIAGNOSIS — G8929 Other chronic pain: Secondary | ICD-10-CM

## 2019-02-09 DIAGNOSIS — G629 Polyneuropathy, unspecified: Secondary | ICD-10-CM

## 2019-02-09 DIAGNOSIS — M5136 Other intervertebral disc degeneration, lumbar region: Secondary | ICD-10-CM | POA: Diagnosis present

## 2019-02-09 DIAGNOSIS — M51369 Other intervertebral disc degeneration, lumbar region without mention of lumbar back pain or lower extremity pain: Secondary | ICD-10-CM

## 2019-02-09 DIAGNOSIS — M5416 Radiculopathy, lumbar region: Secondary | ICD-10-CM | POA: Diagnosis not present

## 2019-02-09 DIAGNOSIS — Z981 Arthrodesis status: Secondary | ICD-10-CM | POA: Diagnosis not present

## 2019-02-09 NOTE — Patient Instructions (Addendum)
1. Plan for caudal epidural steroid injection without sedation 2. Thoracic MRI 3. Referral to Psychology for SCS eval ____________________________________________________________________________________________  Preparing for your procedure (without sedation)  Procedure appointments are limited to planned procedures: . No Prescription Refills. . No disability issues will be discussed. . No medication changes will be discussed.  Instructions: . Oral Intake: Do not eat or drink anything for at least 3 hours prior to your procedure. . Transportation: Unless otherwise stated by your physician, you may drive yourself after the procedure. . Blood Pressure Medicine: Take your blood pressure medicine with a sip of water the morning of the procedure. . Blood thinners: Notify our staff if you are taking any blood thinners. Depending on which one you take, there will be specific instructions on how and when to stop it. . Diabetics on insulin: Notify the staff so that you can be scheduled 1st case in the morning. If your diabetes requires high dose insulin, take only  of your normal insulin dose the morning of the procedure and notify the staff that you have done so. . Preventing infections: Shower with an antibacterial soap the morning of your procedure.  . Build-up your immune system: Take 1000 mg of Vitamin C with every meal (3 times a day) the day prior to your procedure. Marland Kitchen Antibiotics: Inform the staff if you have a condition or reason that requires you to take antibiotics before dental procedures. . Pregnancy: If you are pregnant, call and cancel the procedure. . Sickness: If you have a cold, fever, or any active infections, call and cancel the procedure. . Arrival: You must be in the facility at least 30 minutes prior to your scheduled procedure. . Children: Do not bring any children with you. . Dress appropriately: Bring dark clothing that you would not mind if they get stained. . Valuables:  Do not bring any jewelry or valuables.  Reasons to call and reschedule or cancel your procedure: (Following these recommendations will minimize the risk of a serious complication.) . Surgeries: Avoid having procedures within 2 weeks of any surgery. (Avoid for 2 weeks before or after any surgery). . Flu Shots: Avoid having procedures within 2 weeks of a flu shots or . (Avoid for 2 weeks before or after immunizations). . Barium: Avoid having a procedure within 7-10 days after having had a radiological study involving the use of radiological contrast. (Myelograms, Barium swallow or enema study). . Heart attacks: Avoid any elective procedures or surgeries for the initial 6 months after a "Myocardial Infarction" (Heart Attack). . Blood thinners: It is imperative that you stop these medications before procedures. Let us know if you if you take any blood thinner.  . Infection: Avoid procedures during or within two weeks of an infection (including chest colds or gastrointestinal problems). Symptoms associated with infections include: Localized redness, fever, chills, night sweats or profuse sweating, burning sensation when voiding, cough, congestion, stuffiness, runny nose, sore throat, diarrhea, nausea, vomiting, cold or Flu symptoms, recent or current infections. It is specially important if the infection is over the area that we intend to treat. Marland Kitchen Heart and lung problems: Symptoms that may suggest an active cardiopulmonary problem include: cough, chest pain, breathing difficulties or shortness of breath, dizziness, ankle swelling, uncontrolled high or unusually low blood pressure, and/or palpitations. If you are experiencing any of these symptoms, cancel your procedure and contact your primary care physician for an evaluation.  Remember:  Regular Business hours are:  Monday to Thursday 8:00 AM to 4:00 PM  Provider's Schedule: Milinda Pointer, MD:  Procedure days: Tuesday and Thursday 7:30 AM to 4:00  PM  Gillis Santa, MD:  Procedure days: Monday and Wednesday 7:30 AM to 4:00 PM ____________________________________________________________________________________________   Epidural Steroid Injection Patient Information  Description: The epidural space surrounds the nerves as they exit the spinal cord.  In some patients, the nerves can be compressed and inflamed by a bulging disc or a tight spinal canal (spinal stenosis).  By injecting steroids into the epidural space, we can bring irritated nerves into direct contact with a potentially helpful medication.  These steroids act directly on the irritated nerves and can reduce swelling and inflammation which often leads to decreased pain.  Epidural steroids may be injected anywhere along the spine and from the neck to the low back depending upon the location of your pain.   After numbing the skin with local anesthetic (like Novocaine), a small needle is passed into the epidural space slowly.  You may experience a sensation of pressure while this is being done.  The entire block usually last less than 10 minutes.  Conditions which may be treated by epidural steroids:   Low back and leg pain  Neck and arm pain  Spinal stenosis  Post-laminectomy syndrome  Herpes zoster (shingles) pain  Pain from compression fractures  Preparation for the injection:  1. Do not eat any solid food or dairy products within 3 hours of your appointment.  2. You may drink clear liquids up to 3 hours before appointment.  Clear liquids include water, black coffee, juice or soda.  No milk or cream please. 3. You may take your regular medication, including pain medications, with a sip of water before your appointment  Diabetics should hold regular insulin (if taken separately) and take 1/2 normal NPH dos the morning of the procedure.  Carry some sugar containing items with you to your appointment. 4. A blood pressure cuff, EKG and other monitors will often be applied  during the procedure.  Some patients may need to have extra oxygen administered for a short period. 5. You will be asked to provide medical information, including your allergies, prior to the procedure.  We must know immediately if you are taking blood thinners (like Coumadin/Warfarin)  Or if you are allergic to IV iodine contrast (dye). We must know if you could possible be pregnant.  Possible side-effects:  Bleeding from needle site  Infection (rare, may require surgery)  Nerve injury (rare)  Numbness & tingling (temporary)  Difficulty urinating (rare, temporary)  Spinal headache ( a headache worse with upright posture)  Light -headedness (temporary)  Pain at injection site (several days)  Decreased blood pressure (temporary)  Weakness in arm/leg (temporary)  Pressure sensation in back/neck (temporary)  Call if you experience:  Fever/chills associated with headache or increased back/neck pain.  Headache worsened by an upright position.  New onset weakness or numbness of an extremity below the injection site  Hives or difficulty breathing (go to the emergency room)  Inflammation or drainage at the infection site  Severe back/neck pain  Any new symptoms which are concerning to you  Please note:  Although the local anesthetic injected can often make your back or neck feel good for several hours after the injection, the pain will likely return.  It takes 3-7 days for steroids to work in the epidural space.  You may not notice any pain relief for at least that one week.  If effective, we will often do a series of  three injections spaced 3-6 weeks apart to maximally decrease your pain.  After the initial series, we generally will wait several months before considering a repeat injection of the same type.  If you have any questions, please call [phone] [facility]

## 2019-02-17 ENCOUNTER — Ambulatory Visit: Payer: BC Managed Care – PPO | Admitting: Student in an Organized Health Care Education/Training Program

## 2019-02-24 ENCOUNTER — Telehealth: Payer: Self-pay

## 2019-02-24 NOTE — Telephone Encounter (Signed)
He is having an MRI this Friday and wants to know if he can get a sedative because he will be in there over an hour. His pharmacy is [facility] and please call him and let him know if it is called in.

## 2019-02-24 NOTE — Telephone Encounter (Signed)
I have put a note back to Dr. Holley Raring for advise.

## 2019-02-25 ENCOUNTER — Other Ambulatory Visit: Payer: Self-pay | Admitting: Student in an Organized Health Care Education/Training Program

## 2019-02-25 ENCOUNTER — Telehealth: Payer: Self-pay | Admitting: *Deleted

## 2019-02-25 MED ORDER — DIAZEPAM 5 MG PO TABS: 5.0000 mg | ORAL_TABLET | Freq: Once | ORAL | 0 refills | Status: DC | PRN

## 2019-02-25 NOTE — Progress Notes (Signed)
Patient requesting sedation/anxiolytic prior to MRI. See below.  Requested Prescriptions   Signed Prescriptions Disp Refills  . diazepam (VALIUM) 5 MG tablet 1 tablet 0    Sig: Take 1 tablet (5 mg total) by mouth once as needed for up to 1 dose (prior to MRI).

## 2019-02-25 NOTE — Telephone Encounter (Signed)
Medication called in per Dr. Holley Raring. Patient called and LVM to return call for instructions.

## 2019-02-25 NOTE — Telephone Encounter (Signed)
Patient called and informed of Valium called in for MRI tomorrow. Instructed MUST have driver due to Valium. States his wife is going to drive him. Understand to take 30 min prior to appt.

## 2019-02-26 ENCOUNTER — Ambulatory Visit
Admission: RE | Admit: 2019-02-26 | Discharge: 2019-02-26 | Disposition: A | Discharge status: Home / Self Care | Payer: Medicare Other | Source: Ambulatory Visit | Attending: Student in an Organized Health Care Education/Training Program | Admitting: Student in an Organized Health Care Education/Training Program

## 2019-02-26 ENCOUNTER — Other Ambulatory Visit: Payer: Self-pay

## 2019-02-26 DIAGNOSIS — M5414 Radiculopathy, thoracic region: Secondary | ICD-10-CM | POA: Insufficient documentation

## 2019-02-26 DIAGNOSIS — G8929 Other chronic pain: Secondary | ICD-10-CM

## 2019-02-26 DIAGNOSIS — M5416 Radiculopathy, lumbar region: Secondary | ICD-10-CM | POA: Diagnosis present

## 2019-03-08 ENCOUNTER — Other Ambulatory Visit: Payer: Self-pay

## 2019-03-08 ENCOUNTER — Ambulatory Visit
Admission: RE | Admit: 2019-03-08 | Discharge: 2019-03-08 | Disposition: A | Discharge status: Home / Self Care | Payer: Medicare Other | Source: Ambulatory Visit | Attending: Student in an Organized Health Care Education/Training Program | Admitting: Student in an Organized Health Care Education/Training Program

## 2019-03-08 ENCOUNTER — Encounter: Payer: Self-pay | Admitting: Student in an Organized Health Care Education/Training Program

## 2019-03-08 ENCOUNTER — Ambulatory Visit (HOSPITAL_BASED_OUTPATIENT_CLINIC_OR_DEPARTMENT_OTHER): Payer: Medicare Other | Admitting: Student in an Organized Health Care Education/Training Program

## 2019-03-08 VITALS — BP 134/82 | HR 74 | Temp 97.6°F | Resp 15 | Ht 69.5 in | Wt 205.0 lb

## 2019-03-08 DIAGNOSIS — M5416 Radiculopathy, lumbar region: Secondary | ICD-10-CM | POA: Insufficient documentation

## 2019-03-08 DIAGNOSIS — G8929 Other chronic pain: Secondary | ICD-10-CM | POA: Diagnosis not present

## 2019-03-08 MED ORDER — IOHEXOL 180 MG/ML  SOLN
10.0000 mL | Freq: Once | INTRAMUSCULAR | Status: AC
  Administered 2019-03-08: 10 mL via EPIDURAL

## 2019-03-08 MED ORDER — SODIUM CHLORIDE 0.9% FLUSH
1.0000 mL | Freq: Once | INTRAVENOUS | Status: AC
  Administered 2019-03-08: 09:00:00 1 mL

## 2019-03-08 MED ORDER — LIDOCAINE HCL 2 % IJ SOLN
20.0000 mL | Freq: Once | INTRAMUSCULAR | Status: AC
  Administered 2019-03-08: 400 mg

## 2019-03-08 MED ORDER — ROPIVACAINE HCL 2 MG/ML IJ SOLN
INTRAMUSCULAR | Status: AC
  Filled 2019-03-08: qty 10

## 2019-03-08 MED ORDER — ROPIVACAINE HCL 2 MG/ML IJ SOLN
1.0000 mL | Freq: Once | INTRAMUSCULAR | Status: AC
  Administered 2019-03-08: 1 mL via EPIDURAL

## 2019-03-08 MED ORDER — LIDOCAINE HCL 2 % IJ SOLN
INTRAMUSCULAR | Status: AC
  Filled 2019-03-08: qty 20

## 2019-03-08 MED ORDER — SODIUM CHLORIDE (PF) 0.9 % IJ SOLN
INTRAMUSCULAR | Status: AC
  Filled 2019-03-08: qty 10

## 2019-03-08 MED ORDER — DEXAMETHASONE SODIUM PHOSPHATE 10 MG/ML IJ SOLN
10.0000 mg | Freq: Once | INTRAMUSCULAR | Status: AC
  Administered 2019-03-08: 10 mg

## 2019-03-08 MED ORDER — DEXAMETHASONE SODIUM PHOSPHATE 10 MG/ML IJ SOLN
INTRAMUSCULAR | Status: AC
  Filled 2019-03-08: qty 1

## 2019-03-08 NOTE — Progress Notes (Signed)
Safety precautions to be maintained throughout the outpatient stay will include: orient to surroundings, keep bed in low position, maintain call bell within reach at all times, provide assistance with transfer out of bed and ambulation.  

## 2019-03-08 NOTE — Progress Notes (Signed)
Patient's Name: Adam Lucas  MRN: [id number]  Referring Provider: Tracie Harrier, MD  DOB: 1950-09-04  PCP: Tracie Harrier, MD  DOS: 03/08/2019  Note by: Gillis Santa, MD  Service setting: Ambulatory outpatient  Specialty: Interventional Pain Management  Patient type: Established  Location: [facility] (AMB) Pain Management Facility  Visit type: Interventional Procedure   Primary Reason for Visit: Interventional Pain Management Treatment. CC: back pain and leg pain  Procedure:          Anesthesia, Analgesia, Anxiolysis:  Type: Diagnostic Epidural Steroid Injection #1  Region: Caudal Level: Sacrococcygeal   Laterality: Midline       Type: Local Anesthesia  Local Anesthetic: Lidocaine 1-2%  Position: Prone   Indications: 1. Chronic radicular lumbar pain    Pain Score: Pre-procedure: 7 /10 Post-procedure: 6 /10   Pre-op Assessment:  Adam Lucas is a 68 y.o. (year old), male patient, seen today for interventional treatment. He  has a past surgical history that includes left heart catheterization with coronary angiogram (N/A, 07/17/2014); Coronary angioplasty with stent (07/17/2014; 08/09/2014); Tonsillectomy and adenoidectomy (1970'S); Carpal tunnel release (Right, 2015); Knee arthroscopy (Left, ~ 2002); Posterior lumbar fusion 4 level (2005); ORIF finger / thumb fracture (Right, ~ 2012); Hand ligament reconstruction (Right, ~ 2013); Extracorporeal shock wave lithotripsy (2000's X 1); Fracture surgery; coronary angiogram (08/09/2014); percutaneous coronary stent intervention (pci-s) (08/09/2014); Colonoscopy with propofol (N/A, 11/02/2015); Back surgery; Total hip arthroplasty (Left, 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 Adam Lucas has a current medication list which includes the following prescription(s): acetaminophen, aspirin ec, carvedilol, cholecalciferol, duloxetine, ergocalciferol, fluticasone, gabapentin, hydrochlorothiazide, losartan-hydrochlorothiazide, magnesium oxide, nitroglycerin, pravastatin,  testosterone cypionate, thiamine, tramadol, turmeric curcumin, vitamin b-12, vitamin c, diazepam, and enoxaparin. His primarily concern today is the No chief complaint on file.  Initial Vital Signs:  Pulse/HCG Rate: 79  Temp: 97.6 F (36.4 C) Resp: 16 BP: 115/71 SpO2: 98 %  BMI: Estimated body mass index is 29.84 kg/m as calculated from the following:   Height as of this encounter: 5' 9.5" (1.765 m).   Weight as of this encounter: 205 lb (93 kg).  Risk Assessment: Allergies: Reviewed. He is allergic to atorvastatin and crestor [rosuvastatin calcium].  Allergy Precautions: None required Coagulopathies: Reviewed. None identified.  Blood-thinner therapy: None at this time Active Infection(s): Reviewed. None identified. Adam Lucas is afebrile  Site Confirmation: Adam Lucas was asked to confirm the procedure and laterality before marking the site Procedure checklist: Completed Consent: Before the procedure and under the influence of no sedative(s), amnesic(s), or anxiolytics, the patient was informed of the treatment options, risks and possible complications. To fulfill our ethical and legal obligations, as recommended by the American Medical Association's Code of Ethics, I have informed the patient of my clinical impression; the nature and purpose of the treatment or procedure; the risks, benefits, and possible complications of the intervention; the alternatives, including doing nothing; the risk(s) and benefit(s) of the alternative treatment(s) or procedure(s); and the risk(s) and benefit(s) of doing nothing. The patient was provided information about the general risks and possible complications associated with the procedure. These may include, but are not limited to: failure to achieve desired goals, infection, bleeding, organ or nerve damage, allergic reactions, paralysis, and death. In addition, the patient was informed of those risks and complications associated to Spine-related procedures,  such as failure to decrease pain; infection (i.e.: Meningitis, epidural or intraspinal abscess); bleeding (i.e.: epidural hematoma, subarachnoid hemorrhage, or any other type of intraspinal or peri-dural bleeding); organ or nerve damage (i.e.:  Any type of peripheral nerve, nerve root, or spinal cord injury) with subsequent damage to sensory, motor, and/or autonomic systems, resulting in permanent pain, numbness, and/or weakness of one or several areas of the body; allergic reactions; (i.e.: anaphylactic reaction); and/or death. Furthermore, the patient was informed of those risks and complications associated with the medications. These include, but are not limited to: allergic reactions (i.e.: anaphylactic or anaphylactoid reaction(s)); adrenal axis suppression; blood sugar elevation that in diabetics may result in ketoacidosis or comma; water retention that in patients with history of congestive heart failure may result in shortness of breath, pulmonary edema, and decompensation with resultant heart failure; weight gain; swelling or edema; medication-induced neural toxicity; particulate matter embolism and blood vessel occlusion with resultant organ, and/or nervous system infarction; and/or aseptic necrosis of one or more joints. Finally, the patient was informed that Medicine is not an exact science; therefore, there is also the possibility of unforeseen or unpredictable risks and/or possible complications that may result in a catastrophic outcome. The patient indicated having understood very clearly. We have given the patient no guarantees and we have made no promises. Enough time was given to the patient to ask questions, all of which were answered to the patient's satisfaction. Adam Lucas has indicated that he wanted to continue with the procedure. Attestation: I, the ordering provider, attest that I have discussed with the patient the benefits, risks, side-effects, alternatives, likelihood of achieving goals,  and potential problems during recovery for the procedure that I have provided informed consent. Date  Time: 03/08/2019  8:30 AM  Pre-Procedure Preparation:  Monitoring: As per clinic protocol. Respiration, ETCO2, SpO2, BP, heart rate and rhythm monitor placed and checked for adequate function Safety Precautions: Patient was assessed for positional comfort and pressure points before starting the procedure. Time-out: I initiated and conducted the "Time-out" before starting the procedure, as per protocol. The patient was asked to participate by confirming the accuracy of the "Time Out" information. Verification of the correct person, site, and procedure were performed and confirmed by me, the nursing staff, and the patient. "Time-out" conducted as per Joint Commission's Universal Protocol (UP.01.01.01). Time: 0925  Description of Procedure:          Target Area: Caudal Epidural Canal. Approach: Midline approach. Area Prepped: Entire Posterior Sacrococcygeal Region Prepping solution: DuraPrep (Iodine Povacrylex [0.7% available iodine] and Isopropyl Alcohol, 74% w/w) Safety Precautions: Aspiration looking for blood return was conducted prior to all injections. At no point did we inject any substances, as a needle was being advanced. No attempts were made at seeking any paresthesias. Safe injection practices and needle disposal techniques used. Medications properly checked for expiration dates. SDV (single dose vial) medications used. Description of the Procedure: Protocol guidelines were followed. The patient was placed in position over the fluoroscopy table. The target area was identified and the area prepped in the usual manner. Skin & deeper tissues infiltrated with local anesthetic. Appropriate amount of time allowed to pass for local anesthetics to take effect. The procedure needles were then advanced to the target area. Proper needle placement secured. Negative aspiration confirmed. Solution injected  in intermittent fashion, asking for systemic symptoms every 0.5cc of injectate. The needles were then removed and the area cleansed, making sure to leave some of the prepping solution back to take advantage of its long term bactericidal properties. Vitals:   03/08/19 0835 03/08/19 0924 03/08/19 0929 03/08/19 0937  BP: 115/71 131/70 132/70 134/82  Pulse: 79 75 74 74  Resp: 16 14  16 15  Temp: 97.6 F (36.4 C)     TempSrc: Oral     SpO2: 98% 98% 98% 97%  Weight: 205 lb (93 kg)     Height: 5' 9.5" (1.765 m)       Start Time: 0925 hrs. End Time: 0931(MD examing back for SCS trial after procedure) hrs. Materials:  Needle(s) Type: Epidural needle Gauge: 22G Length: 3.5-in Medication(s): Please see orders for medications and dosing details. 6 cc solution made of 3 cc of preservative-free saline, 2 cc of 0.2% ropivacaine, 1 cc of Decadron 10 mg/cc. Imaging Guidance (Spinal):          Type of Imaging Technique: Fluoroscopy Guidance (Spinal) Indication(s): Assistance in needle guidance and placement for procedures requiring needle placement in or near specific anatomical locations not easily accessible without such assistance. Exposure Time: Please see nurses notes. Contrast: Before injecting any contrast, we confirmed that the patient did not have an allergy to iodine, shellfish, or radiological contrast. Once satisfactory needle placement was completed at the desired level, radiological contrast was injected. Contrast injected under live fluoroscopy. No contrast complications. See chart for type and volume of contrast used. Fluoroscopic Guidance: I was personally present during the use of fluoroscopy. "Tunnel Vision Technique" used to obtain the best possible view of the target area. Parallax error corrected before commencing the procedure. "Direction-depth-direction" technique used to introduce the needle under continuous pulsed fluoroscopy. Once target was reached, antero-posterior, oblique, and  lateral fluoroscopic projection used confirm needle placement in all planes. Images permanently stored in EMR. Interpretation: I personally interpreted the imaging intraoperatively. Adequate needle placement confirmed in multiple planes. Appropriate spread of contrast into desired area was observed. No evidence of afferent or efferent intravascular uptake. No intrathecal or subarachnoid spread observed. Permanent images saved into the patient's record.  Antibiotic Prophylaxis:   Anti-infectives (From admission, onward)   None     Indication(s): None identified  Post-operative Assessment:  Post-procedure Vital Signs:  Pulse/HCG Rate: 74  Temp: 97.6 F (36.4 C) Resp: 15 BP: 134/82 SpO2: 97 %  EBL: None  Complications: No immediate post-treatment complications observed by team, or reported by patient.  Note: The patient tolerated the entire procedure well. A repeat set of vitals were taken after the procedure and the patient was kept under observation following institutional policy, for this type of procedure. Post-procedural neurological assessment was performed, showing return to baseline, prior to discharge. The patient was provided with post-procedure discharge instructions, including a section on how to identify potential problems. Should any problems arise concerning this procedure, the patient was given instructions to immediately contact us, at any time, without hesitation. In any case, we plan to contact the patient by telephone for a follow-up status report regarding this interventional procedure.  Comments:  No additional relevant information.  Plan of Care  Orders:  Orders Placed This Encounter  Procedures  . [facility] C-ARM 1-60 MIN NO REPORT    Intraoperative interpretation by procedural physician at [facility].    Standing Status:   Standing    Number of Occurrences:   1    Order Specific Question:   Reason for exam:    Answer:   Assistance in needle  guidance and placement for procedures requiring needle placement in or near specific anatomical locations not easily accessible without such assistance.    Patient has completed his thoracic and lumbar MRI.  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  I do have concerns whether the patient will be able to tolerate 2 percutaneous electrodes for a SCS trial given his thoracic MRI findings.  Will discuss with colleague and discussed with patient in 2 weeks when he returns for postprocedural evaluation after his caudal ESI today.  Given the patient's complex spine, may need referral to Dr. Mearl Latin for spinal cord stimulator trial.  Medications ordered for procedure: Meds ordered this encounter  Medications  . iohexol (OMNIPAQUE) 180 MG/ML injection 10 mL    Must be Myelogram-compatible. If not available, you may substitute with a water-soluble, non-ionic, hypoallergenic, myelogram-compatible radiological contrast medium.  Marland Kitchen lidocaine (XYLOCAINE) 2 % (with pres) injection 400 mg  . ropivacaine (PF) 2 mg/mL (0.2%) (NAROPIN) injection 1 mL  . dexamethasone (DECADRON) injection 10 mg  . sodium chloride flush (NS) 0.9 % injection 1 mL   Medications administered: We administered iohexol, lidocaine, ropivacaine (PF) 2 mg/mL (0.2%), dexamethasone, and sodium chloride flush.  See the medical record for exact dosing, route, and time of administration.  Follow-up plan:   Return in about 2 weeks (around 03/22/2019) for Post Procedure Evaluation, in person.     Recent Visits Date Type Provider Dept  02/09/19 Office Visit Gillis Santa, MD [facility]  Showing recent visits within past 90 days and meeting all other requirements   Today's Visits Date Type  Provider Dept  03/08/19 Procedure visit Gillis Santa, MD [facility]  Showing today's visits and meeting all other requirements   Future Appointments Date Type Provider Dept  03/22/19 Appointment Gillis Santa, MD [facility]  Showing future appointments within next 90 days and meeting all other requirements   Disposition: Discharge home  Discharge Date & Time: 03/08/2019; 0936 hrs.   Primary Care Physician: Tracie Harrier, MD Location: [facility] Note by: Gillis Santa, MD Date: 03/08/2019; Time: 1:08 PM  Disclaimer:  Medicine is not an exact science. The only guarantee in medicine is that nothing is guaranteed. It is important to note that the decision to proceed with this intervention was based on the information collected from the patient. The Data and conclusions were drawn from the patient's questionnaire, the interview, and the physical examination. Because the information was provided in large part by the patient, it cannot be guaranteed that it has not been purposely or unconsciously manipulated. Every effort has been made to obtain as much relevant data as possible for this evaluation. It is important to note that the conclusions that lead to this procedure are derived in large part from the available data. Always take into account that the treatment will also be dependent on availability of resources and existing treatment guidelines, considered by other Pain Management Practitioners as being common knowledge and practice, at the time of the intervention. For Medico-Legal purposes, it is also important to point out that variation in procedural techniques and pharmacological choices are the acceptable norm. The indications, contraindications, technique, and results of the above procedure should only be interpreted and judged by a Board-Certified Interventional Pain Specialist with extensive familiarity and expertise in the same exact  procedure and technique.

## 2019-03-08 NOTE — Patient Instructions (Signed)
____________________________________________________________________________________________  Post-Procedure Discharge Instructions  Instructions:  Apply ice:   Purpose: This will minimize any swelling and discomfort after procedure.   When: Day of procedure, as soon as you get home.  How: Fill a plastic sandwich bag with crushed ice. Cover it with a small towel and apply to injection site.  How long: (15 min on, 15 min off) Apply for 15 minutes then remove x 15 minutes.  Repeat sequence on day of procedure, until you go to bed.  Apply heat:   Purpose: To treat any soreness and discomfort from the procedure.  When: Starting the next day after the procedure.  How: Apply heat to procedure site starting the day following the procedure.  How long: May continue to repeat daily, until discomfort goes away.  Food intake: Start with clear liquids (like water) and advance to regular food, as tolerated.   Physical activities: Keep activities to a minimum for the first 8 hours after the procedure. After that, then as tolerated.  Driving: If you have received any sedation, be responsible and do not drive. You are not allowed to drive for 24 hours after having sedation.  Blood thinner: (Applies only to those taking blood thinners) You may restart your blood thinner 6 hours after your procedure.  Insulin: (Applies only to Diabetic patients taking insulin) As soon as you can eat, you may resume your normal dosing schedule.  Infection prevention: Keep procedure site clean and dry. Shower daily and clean area with soap and water.  Post-procedure Pain Diary: Extremely important that this be done correctly and accurately. Recorded information will be used to determine the next step in treatment. For the purpose of accuracy, follow these rules:  Evaluate only the area treated. Do not report or include pain from an untreated area. For the purpose of this evaluation, ignore all other areas of pain,  except for the treated area.  After your procedure, avoid taking a long nap and attempting to complete the pain diary after you wake up. Instead, set your alarm clock to go off every hour, on the hour, for the initial 8 hours after the procedure. Document the duration of the numbing medicine, and the relief you are getting from it.  Do not go to sleep and attempt to complete it later. It will not be accurate. If you received sedation, it is likely that you were given a medication that may cause amnesia. Because of this, completing the diary at a later time may cause the information to be inaccurate. This information is needed to plan your care.  Follow-up appointment: Keep your post-procedure follow-up evaluation appointment after the procedure (usually 2 weeks for most procedures, 6 weeks for radiofrequencies). DO NOT FORGET to bring you pain diary with you.   Expect: (What should I expect to see with my procedure?)  From numbing medicine (AKA: Local Anesthetics): Numbness or decrease in pain. You may also experience some weakness, which if present, could last for the duration of the local anesthetic.  Onset: Full effect within 15 minutes of injected.  Duration: It will depend on the type of local anesthetic used. On the average, 1 to 8 hours.   From steroids (Applies only if steroids were used): Decrease in swelling or inflammation. Once inflammation is improved, relief of the pain will follow.  Onset of benefits: Depends on the amount of swelling present. The more swelling, the longer it will take for the benefits to be seen. In some cases, up to 10 days.    Duration: Steroids will stay in the system x 2 weeks. Duration of benefits will depend on multiple posibilities including persistent irritating factors.  Side-effects: If present, they may typically last 2 weeks (the duration of the steroids).  Frequent: Cramps (if they occur, drink Gatorade and take over-the-counter Magnesium 450-500 mg  once to twice a day); water retention with temporary weight gain; increases in blood sugar; decreased immune system response; increased appetite.  Occasional: Facial flushing (red, warm cheeks); mood swings; menstrual changes.  Uncommon: Long-term decrease or suppression of natural hormones; bone thinning. (These are more common with higher doses or more frequent use. This is why we prefer that our patients avoid having any injection therapies in other practices.)   Very Rare: Severe mood changes; psychosis; aseptic necrosis.  From procedure: Some discomfort is to be expected once the numbing medicine wears off. This should be minimal if ice and heat are applied as instructed.  Call if: (When should I call?)  You experience numbness and weakness that gets worse with time, as opposed to wearing off.  New onset bowel or bladder incontinence. (Applies only to procedures done in the spine)  Emergency Numbers:  Durning business hours (Monday - Thursday, 8:00 AM - 4:00 PM) (Friday, 9:00 AM - 12:00 Noon): [phone]  After hours: [phone]  NOTE: If you are having a problem and are unable connect with, or to talk to a provider, then go to your nearest urgent care or emergency department. If the problem is serious and urgent, please call 911. ____________________________________________________________________________________________   Epidural Steroid Injection An epidural steroid injection is a shot of steroid medicine and numbing medicine that is given into the space between the spinal cord and the bones in your back (epidural space). The shot helps relieve pain caused by an irritated or swollen nerve root. The amount of pain relief you get from the injection depends on what is causing the nerve to be swollen and irritated, and how long your pain lasts. You are more likely to benefit from this injection if your pain is strong and comes on suddenly rather than if you have had pain for  a long time. Tell a health care provider about:   Any allergies you have.  All medicines you are taking, including vitamins, herbs, eye drops, creams, and over-the-counter medicines.  Any problems you or family members have had with anesthetic medicines.  Any blood disorders you have.  Any surgeries you have had.  Any medical conditions you have.  Whether you are pregnant or may be pregnant. What are the risks? Generally, this is a safe procedure. However, problems may occur, including:  Headache.  Bleeding.  Infection.  Allergic reaction to medicines.  Damage to your nerves. What happens before the procedure? Staying hydrated Follow instructions from your health care provider about hydration, which may include:  Up to 2 hours before the procedure - you may continue to drink clear liquids, such as water, clear fruit juice, black coffee, and plain tea. Eating and drinking restrictions Follow instructions from your health care provider about eating and drinking, which may include:  8 hours before the procedure - stop eating heavy meals or foods such as meat, fried foods, or fatty foods.  6 hours before the procedure - stop eating light meals or foods, such as toast or cereal.  6 hours before the procedure - stop drinking milk or drinks that contain milk.  2 hours before the procedure - stop drinking clear liquids. Medicine    You may be given medicines to lower anxiety.  Ask your health care provider about: ? Changing or stopping your regular medicines. This is especially important if you are taking diabetes medicines or blood thinners. ? Taking medicines such as aspirin and ibuprofen. These medicines can thin your blood. Do not take these medicines before your procedure if your health care provider instructs you not to. General instructions  Plan to have someone take you home from the hospital or clinic. What happens during the procedure?  You may receive a  medicine to help you relax (sedative).  You will be asked to lie on your abdomen.  The injection site will be cleaned.  A numbing medicine (local anesthetic) will be used to numb the injection site.  A needle will be inserted through your skin into the epidural space. You may feel some discomfort when this happens. An X-ray machine will be used to make sure the needle is put as close as possible to the affected nerve.  A steroid medicine and a local anesthetic will be injected into the epidural space.  The needle will be removed.  A bandage (dressing) will be put over the injection site. What happens after the procedure?  Your blood pressure, heart rate, breathing rate, and blood oxygen level will be monitored until the medicines you were given have worn off.  Your arm or leg may feel weak or numb for a few hours.  The injection site may feel sore.  Do not drive for 24 hours if you received a sedative. This information is not intended to replace advice given to you by your health care provider. Make sure you discuss any questions you have with your health care provider. Document Released: 09/10/2007 Document Revised: 05/16/2017 Document Reviewed: 09/19/2015 Elsevier Patient Education  2020 [employer]  

## 2019-03-09 ENCOUNTER — Telehealth: Payer: Self-pay

## 2019-03-09 NOTE — Telephone Encounter (Signed)
Post procedure phone call.  Patient states he is doing good.  

## 2019-03-12 ENCOUNTER — Encounter: Payer: Medicare Other | Attending: Psychology | Admitting: Psychology

## 2019-03-12 ENCOUNTER — Other Ambulatory Visit: Payer: Self-pay

## 2019-03-12 DIAGNOSIS — M961 Postlaminectomy syndrome, not elsewhere classified: Secondary | ICD-10-CM

## 2019-03-12 DIAGNOSIS — G629 Polyneuropathy, unspecified: Secondary | ICD-10-CM

## 2019-03-12 DIAGNOSIS — Z820 Family history of epilepsy and other diseases of the nervous system: Secondary | ICD-10-CM | POA: Diagnosis not present

## 2019-03-12 DIAGNOSIS — Z955 Presence of coronary angioplasty implant and graft: Secondary | ICD-10-CM | POA: Diagnosis not present

## 2019-03-12 DIAGNOSIS — G894 Chronic pain syndrome: Secondary | ICD-10-CM

## 2019-03-12 DIAGNOSIS — I2510 Atherosclerotic heart disease of native coronary artery without angina pectoris: Secondary | ICD-10-CM | POA: Diagnosis not present

## 2019-03-12 DIAGNOSIS — Z8249 Family history of ischemic heart disease and other diseases of the circulatory system: Secondary | ICD-10-CM | POA: Diagnosis not present

## 2019-03-12 DIAGNOSIS — I252 Old myocardial infarction: Secondary | ICD-10-CM | POA: Insufficient documentation

## 2019-03-12 DIAGNOSIS — I10 Essential (primary) hypertension: Secondary | ICD-10-CM | POA: Diagnosis not present

## 2019-03-12 NOTE — Progress Notes (Signed)
Neuropsychological Consultation   Patient:   Adam Lucas   DOB:   [birth year]  MR Number:  [id number]  Location:  [facility] [place], [place] [id number] [place] [id number] Dept: [phone]           Date of Service:   03/12/2019  Start Time:   10 AM End Time:   11 AM  Provider/Observer:  Ilean Skill, Psy.D.       Clinical Neuropsychologist       Billing Code/Service: Initial diagnostic clinical interview.  Chief Complaint:    Adam Lucas is a 68 year old male referred by Gillis Santa, MD for psychological evaluation as part of the standard protocol for work-up considering spinal cord stimulator trialing and possible implantation.  The patient has been dealing with chronic pain for some time.  The patient has a history of coronary artery disease and postinfarction angina.  The patient also has a history of myocardial infarction with little to no residual heart wall damage.  The patient reports that this heart attack was about 4 years ago when he had 3 stents placed at that time.  The patient has coronary artery disease, carpal tunnel, hypertension, hyperglycemia and total hip replacement.  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  Reason for Service:  Adam Lucas is a 68 year old male referred by Gillis Santa, MD for psychological evaluation as part of the standard protocol for work-up considering spinal cord stimulator trialing and possible implantation.  The patient has been dealing with chronic pain for some time.  The patient has a history of coronary artery disease and postinfarction angina.  The patient also has a history of myocardial infarction with little to no residual heart wall damage.  The patient has coronary artery disease, carpal  tunnel, hypertension, hyperglycemia and total hip replacement.  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  The patient describes continued difficulty with lifting, walking and doing other chores and stability when walking.  The patient reports that he has had these back issues for years and had major back surgery in 2005.  The patient denies any significant life stressors and reports that family situation and financial situation are all stable.  The patient describes good sleep with the exception of waking up sometimes due to pain.  Good appetite and memory are also noted.  Current Status:  The patient describes continued chronic pain symptoms and postlaminectomy syndrome.  Patient reports that he is had improvement initially in 2005 after his first back surgery but continues to have issues with leg pain, weakness, tingling, balance issues at times.  Reliability of Information: The information is derived from 1 hour face-to-face clinical interview as well as medical records reviewed.  Behavioral Observation: Adam Lucas  presents as a 68 y.o.-year-old Right Caucasian Male who appeared his stated age. his dress was Appropriate and he was Well Groomed and his manners were Appropriate to the situation.  his participation was indicative of Appropriate and Attentive behaviors.  There were any physical disabilities noted.  he displayed an appropriate level of cooperation and motivation.     Interactions:    Active Appropriate and Attentive  Attention:   within normal limits and attention span and concentration were  age appropriate  Memory:   within normal limits; recent and remote memory intact  Visuo-spatial:  within normal limits  Speech (Volume):  normal  Speech:   normal; normal  Thought Process:  Coherent and Relevant  Though Content:  WNL; not suicidal and not  homicidal  Orientation:   person, place, time/date and situation  Judgment:   Good  Planning:   Good  Affect:    Appropriate  Mood:    Euthymic  Insight:   Good  Intelligence:   normal  Marital Status/Living: The patient was born and raised in [facility]  The patient has 2 siblings.  No major childhood issues were noted.  The patient is married to his wife of 78 years.  They have 2 daughters age 61 and 38.  No significant issues are noted with his siblings.  Current Employment: The patient is retired.  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  He is worked for the [employer], [employer] as well as [employer].  He is worked in this capacity for 40 years.  The patient denies any PTSD or residual issues from his occupational history.  Substance Use:  No concerns of substance abuse are reported.    Education:   HS Graduate  Medical History:   Past Medical History:  Diagnosis Date  . Chronic lower back pain   . Coronary artery disease   . Degenerative arthritis   . Heart murmur ~ 1958  . High cholesterol   . History of kidney stones   . Hypertension   . Kidney stones   . Migraine    "once q year or 2" (08/09/2014)  . Myocardial infarction ([place]) 07/17/2014  . Neuropathy             Psychiatric History:  No prior psychiatric history noted.  Family Med/Psych History:  Family History  Problem Relation Age of Onset  . Deep vein thrombosis Mother   . Parkinson's disease Father   . Heart attack Father    Impression/DX:  Adam Lucas is a 68 year old male referred by Gillis Santa, MD for psychological evaluation as part of the standard protocol for work-up considering spinal cord stimulator trialing and possible implantation.  The patient has been dealing with chronic pain for some time.  The patient has a history of coronary artery disease and postinfarction angina.  The patient also has a  history of myocardial infarction with little to no residual heart wall damage.  The patient reports that this heart attack was about 4 years ago when he had 3 stents placed at that time.  The patient has coronary artery disease, carpal tunnel, hypertension, hyperglycemia and total hip replacement.  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  The patient describes continued chronic pain symptoms and postlaminectomy syndrome.  Patient reports that he is had improvement initially in 2005 after his first back surgery but continues to have issues with leg pain, weakness, tingling, balance issues at times.  Disposition/Plan:  The patient will complete the [place] multiphasic personality inventory-2 as well as the pain patient profile measures.  We will take those objective measures combine them with clinical history and data with formal psychological report to be completed.  Diagnosis:    Polyneuropathy  Lumbar post-laminectomy syndrome  Chronic pain syndrome         Electronically Signed  _______________________ Ilean Skill, Psy.D.

## 2019-03-22 ENCOUNTER — Encounter: Payer: Self-pay | Admitting: Student in an Organized Health Care Education/Training Program

## 2019-03-22 ENCOUNTER — Ambulatory Visit
Payer: Medicare Other | Attending: Student in an Organized Health Care Education/Training Program | Admitting: Student in an Organized Health Care Education/Training Program

## 2019-03-22 ENCOUNTER — Other Ambulatory Visit: Payer: Self-pay

## 2019-03-22 DIAGNOSIS — Z981 Arthrodesis status: Secondary | ICD-10-CM | POA: Diagnosis not present

## 2019-03-22 DIAGNOSIS — M961 Postlaminectomy syndrome, not elsewhere classified: Secondary | ICD-10-CM

## 2019-03-22 DIAGNOSIS — M5416 Radiculopathy, lumbar region: Secondary | ICD-10-CM | POA: Diagnosis not present

## 2019-03-22 DIAGNOSIS — G8929 Other chronic pain: Secondary | ICD-10-CM

## 2019-03-22 NOTE — Progress Notes (Signed)
Safety precautions to be maintained throughout the outpatient stay will include: orient to surroundings, keep bed in low position, maintain call bell within reach at all times, provide assistance with transfer out of bed and ambulation.  

## 2019-03-22 NOTE — Progress Notes (Signed)
Patient's Name: Adam Lucas  MRN: [id number]  Referring Provider: Tracie Harrier, MD  DOB: 10-24-1950  PCP: Tracie Harrier, MD  DOS: 03/22/2019  Note by: Gillis Santa, MD  Service setting: Ambulatory outpatient  Attending: Gillis Santa, MD  Location: [facility] (AMB) Pain Management Facility  Specialty: Interventional Pain Management  Patient type: Established   Primary Reason(s) for Visit: Encounter for post-procedure evaluation of chronic illness with mild to moderate exacerbation CC: Back Pain (lumbar bilateral )  HPI  Adam Lucas is a 68 y.o. year old, male patient, who comes today for a post-procedure evaluation. 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 His primarily concern today is the Back Pain (lumbar bilateral )  Pain Assessment: Location: Lower, Left, Right Back Radiating: into both legs to approx the knee Onset: More than a month ago Duration: Chronic pain Quality: Discomfort, Sharp, Burning, Tingling, Aching Severity: 2 /10 (subjective, self-reported pain score)  Note: Reported level is compatible with observation.  Effect on ADL: limiting. Timing: Constant Modifying factors: procedure, rest BP:    HR:    Adam Lucas comes in today for post-procedure evaluation.  Further details on both, my assessment(s), as well as the proposed treatment plan, please see below.  Post-Procedure Assessment  03/08/2019 Procedure: Caudal ESI Pre-procedure pain score:   7/10 Post-procedure pain score: 6/10         Influential Factors: BMI:   Intra-procedural challenges: None observed.         Assessment challenges: None detected.              Reported side-effects: None.        Post-procedural adverse reactions or complications: None reported         Initial Analgesic Effects (1st post-procedure hour): 30 %            Sedation: Please see nurses note. When none is used, analgesia during this period is strictly due to the local anesthetic. When sedation is administered, analgesia may be a combination of the IV analgesic/anxiolytic, plus the effect of the local anesthetics used. Interpretative annotation: Clinically possible results. Analgesia during this period is likely to be Local Anesthetic and/or IV Sedative (Analgesic/Anxiolytic) related.          Persistent Analgesic Response (subsequent 4-6 hours post-procedure): 30 %            Analgesic effects during this period is associated to the localized infiltration of local anesthetics and therefore caries significant diagnostic value as to the etiological location, or anatomical origin, of the pain. Expected duration of relief is directly dependent on the pharmacodynamics of the local anesthetic used. Long-acting (4-6 hours) anesthetics used.  Interpretative annotation: Clinically possible results. Analgesia during this period is likely to be Local Anesthetic-related.          Analgesic Response past initial 6 hours post-procedure: 80 %(back is much better but legs from hip down are still hurting in both legs to approx the knee)           Defined as the period of time past the expected duration of local anesthetics (1 hour  for short-acting and 4-6 hours for long-acting). With the possible exception of prolonged sympathetic blockade from the local anesthetics, benefits during this period are typically attributed to, or associated with, other factors such as analgesic sensory neuropraxia, antiinflammatory effects, or  beneficial biochemical changes provided by agents other than the local anesthetics.  Interpretative annotation: Expected clinical result. Good relief. No permanent benefit expected. Inflammation plays a part in the etiology to the pain.          Current benefits: Defined as reported results that persistent at this point in time.   Analgesia: 75 %            Function: [name] reports improvement in function ROM: Adam Lucas reports improvement in ROM Interpretative annotation: Clinically significant results. Therapeutic benefit observed. Effective diagnostic intervention.          Interpretation: Results would suggest a successful diagnostic intervention.                  Plan:  Please see "Plan of Care" for details.                Laboratory Chemistry Profile   Screening Lab Results  Component Value Date   STAPHAUREUS NEGATIVE 10/08/2017   MRSAPCR NEGATIVE 10/08/2017    Inflammation (CRP: Acute Phase) (ESR: Chronic Phase) Lab Results  Component Value Date   CRP <0.8 10/08/2017   ESRSEDRATE 1 10/08/2017                         Renal Lab Results  Component Value Date   BUN 21 (H) 10/08/2017   CREATININE 1.05 10/08/2017   GFRAA >60 10/08/2017   GFRNONAA >60 10/08/2017                             Hepatic Lab Results  Component Value Date   AST 21 10/08/2017   ALT 19 10/08/2017   ALBUMIN 4.5 10/08/2017                        Note: Lab results reviewed.  Recent Imaging Results  Thoracic spine MRI 02/26/2019 CLINICAL DATA:  68 year old male with chronic mid back pain radiating to the hips. No known injury.  EXAM: MRI THORACIC SPINE WITHOUT CONTRAST  TECHNIQUE: Multiplanar, multisequence MR imaging of the thoracic spine was performed. No intravenous contrast was administered.  COMPARISON:  No prior thoracic imaging.  FINDINGS: Limited cervical spine imaging: Straightening of cervical lordosis with evidence of C4-C5 and C5-C6 disc degeneration.  Thoracic  spine segmentation:  Appears normal.  Alignment: Preserved thoracic kyphosis. Mild degenerative anterolisthesis at T2-T3. Normal cervicothoracic junction alignment.  Vertebrae: There is confluent marrow edema at the posterior T11 vertebral body which on axial images appears related to a Schmorl's node (series 20, image 34). Mild adjacent degenerative appearing T12 anterior and leftward endplate marrow edema. Normal background bone marrow signal. Small thoracic Schmorl's nodes elsewhere. No other acute osseous abnormality identified.  Cord: Spinal cord signal is within normal limits at all visualized levels. Partially visible conus medullaris at T12-L1 appears normal.  Paraspinal and other soft tissues: Negative.  Disc levels:  T1-T2: Small central disc protrusion.  No stenosis.  T2-T3: Disc bulge with broad-based posterior component. No stenosis.  T3-T4: Negative.  T4-T5: Negative.  T5-T6: Mild ligament flavum hypertrophy.  No stenosis.  T6-T7: Negative.  T7-T8: Minimal disc bulge.  No stenosis.  T8-T9: Central disc protrusion with annular fissure (series 20, image 26) partially effaces the ventral CSF space without spinal or foraminal stenosis.  T9-T10: Subarticular disc bulging with mild posterior element hypertrophy. No spinal stenosis. Moderate bilateral T9 foraminal stenosis.  T10-T11: Circumferential disc bulge with broad-based posterior component. Posterior element hypertrophy. No spinal stenosis. Moderate bilateral T10 foraminal stenosis.  T11-T12: Mild circumferential disc bulge. There also seems to be a right foraminal component of disc superimposed on posterior element hypertrophy. No spinal stenosis. Severe right and mild left T11 foraminal stenosis.  IMPRESSION: 1. T11 inferior endplate marrow edema appears related to a degenerative Schmorl's node. Questionable right foraminal component of disc at that level contributing to severe  right T11 foraminal stenosis. 2. Small disc herniation with annular fissure at T8-T9 but no significant stenosis. 3. Lesser thoracic disc degeneration elsewhere. Moderate bilateral T9 and T10 neural foraminal stenosis.   Electronically Signed   By: Genevie Ann M.D.   On: 02/27/2019 21:04  Lumbar Mri 02/26/2019 CLINICAL DATA:  68 year old male with chronic in mid back pain radiating to the hips. No known injury. Prior surgery.  EXAM: MRI LUMBAR SPINE WITHOUT CONTRAST  TECHNIQUE: Multiplanar, multisequence MR imaging of the lumbar spine was performed. No intravenous contrast was administered.  COMPARISON:  Thoracic spine MRI today reported separately. Lumbar MRI 05/05/2017. CT lumbar spine 11/24/2018.  FINDINGS: Segmentation: Transitional anatomy with full size ribs at what was designated the L1 level by CT in June. The same numbering system was used on the 2018 MRI. However, when numbering from the skull base today it becomes apparent that the L5 level is sacralized, as marrow edema is redemonstrated at the T11 posteroinferior endplate on these images.  By this numbering system the previous posterior fusion is from L2-L3 to L4-L5.  Correlation with radiographs is recommended prior to any operative intervention.  Alignment: Stable lumbar lordosis. Subtle chronic anterolisthesis of L4 on L5.  Vertebrae: Hardware susceptibility artifact from the L3 to the L5 level. No marrow edema or evidence of acute osseous abnormality in the lumbar spine. Intact visible sacrum and SI joints.  Conus medullaris and cauda equina: Conus extends to the T12-L1 level. No lower spinal cord or conus signal abnormality.  Paraspinal and other soft tissues: Bulky left nephrolithiasis, better demonstrated by CT. No hydronephrosis or proximal hydroureter today. Postoperative changes to the posterior paraspinal soft tissues with no adverse features.  Disc levels:  T12-L1: Mild far  lateral disc bulging and posterior element hypertrophy. Mild bilateral T12 foraminal stenosis.  L1-L2: Far lateral disc bulging and mild to moderate posterior element hypertrophy. However, no stenosis.  L2-L3:  Prior posterior decompression and fusion.  No stenosis.  L3-L4:  Prior posterior decompression and fusion.  No stenosis.  L4-L5: Prior posterior decompression and fusion. Mild right side osseous foraminal stenosis is stable since 2018. No other stenosis.  L5-S1:  Sacralized, with only a vestigial disc.  No stenosis.  IMPRESSION: 1. 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 Correlation with radiographs is recommended prior to any operative intervention. 2. No lumbar spinal stenosis or acute neural impingement. Chronic right side osseous foraminal stenosis at the lowest fuse level is stable since 2018. 3. Bulky chronic left nephrolithiasis/staghorn calculus.   Electronically Signed   By: Herminio Heads.D.  On: 02/27/2019 21:19  Meds   Current Outpatient Medications:  .  acetaminophen (TYLENOL 8 HOUR ARTHRITIS PAIN) 650 MG CR tablet, Take 1,300 mg by mouth 2 (two) times daily., Disp: , Rfl:  .  aspirin EC 81 MG tablet, Take 81 mg by mouth daily., Disp: , Rfl:  .  carvedilol (COREG) 6.25 MG tablet, Take 1 tablet (6.25 mg total) by mouth 2 (two) times daily with a meal., Disp: 60 tablet, Rfl: 1 .  cholecalciferol (VITAMIN D) 1000 units tablet, Take by mouth once a week. , Disp: , Rfl:  .  DULoxetine (CYMBALTA) 60 MG capsule, Take 60 mg by mouth daily. , Disp: , Rfl:  .  ergocalciferol (VITAMIN D2) 1.25 MG (50000 UT) capsule, Take 50,000 Units by mouth once a week., Disp: , Rfl:  .  fluticasone (FLONASE) 50 MCG/ACT nasal spray,  Place 1 spray into both nostrils daily as needed for allergies., Disp: , Rfl:  .  gabapentin (NEURONTIN) 100 MG capsule, Take 300 mg by mouth 3 (three) times daily. , Disp: , Rfl:  .  hydrochlorothiazide (MICROZIDE) 12.5 MG capsule, Take 12.5 mg by mouth daily., Disp: , Rfl:  .  losartan-hydrochlorothiazide (HYZAAR) 100-12.5 MG tablet, Take 1 tablet by mouth daily., Disp: , Rfl:  .  magnesium oxide (MAG-OX) 400 MG tablet, Take 400 mg by mouth daily., Disp: , Rfl:  .  nitroGLYCERIN (NITROSTAT) 0.4 MG SL tablet, Place 1 tablet (0.4 mg total) under the tongue every 5 (five) minutes x 3 doses as needed for chest pain., Disp: 25 tablet, Rfl: 4 .  pravastatin (PRAVACHOL) 10 MG tablet, Take 10 mg by mouth at bedtime., Disp: , Rfl: 1 .  testosterone cypionate (DEPOTESTOTERONE CYPIONATE) 100 MG/ML injection, Inject 1 mL into the muscle every 21 ( twenty-one) days. , Disp: , Rfl: 0 .  thiamine (VITAMIN B-1) 100 MG tablet, Take 100 mg by mouth daily., Disp: , Rfl:  .  traMADol (ULTRAM) 50 MG tablet, Take 1-2 tablets (50-100 mg total) by mouth every 4 (four) hours as needed for moderate pain., Disp: 60 tablet, Rfl: 0 .  Turmeric Curcumin 500 MG CAPS, Take 500 mg by mouth at bedtime., Disp: , Rfl:  .  vitamin B-12 (CYANOCOBALAMIN) 500 MCG tablet, Take 1,000 mcg by mouth daily. , Disp: , Rfl:  .  vitamin C (ASCORBIC ACID) 500 MG tablet, Take 500 mg by mouth daily., Disp: , Rfl:  .  diazepam (VALIUM) 5 MG tablet, Take 1 tablet (5 mg total) by mouth once as needed for up to 1 dose (prior to MRI). (Patient not taking: Reported on 03/08/2019), Disp: 1 tablet, Rfl: 0 .  enoxaparin (LOVENOX) 40 MG/0.4ML injection, Inject 0.4 mLs (40 mg total) into the skin daily for 14 days., Disp: 14 Syringe, Rfl: 0  ROS  Constitutional: Denies any fever or chills Gastrointestinal: No reported hemesis, hematochezia, vomiting, or acute GI distress Musculoskeletal: Denies any acute onset joint swelling, redness, loss of ROM, or  weakness Neurological: No reported episodes of acute onset apraxia, aphasia, dysarthria, agnosia, amnesia, paralysis, loss of coordination, or loss of consciousness  Allergies  Mr. Bedwell is allergic to atorvastatin and crestor [rosuvastatin calcium].  [place]  Drug: Mr. Ganaway  reports no history of drug use. Alcohol:  reports current alcohol use. Tobacco:  reports that he has never smoked. He has never used smokeless tobacco. Medical:  has a past medical history of Chronic lower back pain, Coronary artery disease, Degenerative arthritis, Heart murmur (~ 1958), High cholesterol,  History of kidney stones, Hypertension, Kidney stones, Migraine, Myocardial infarction ([place]) (07/17/2014), and Neuropathy. Surgical: Mr. Mihalik  has a past surgical history that includes left heart catheterization with coronary angiogram (N/A, 07/17/2014); Coronary angioplasty with stent (07/17/2014; 08/09/2014); Tonsillectomy and adenoidectomy (1970'S); Carpal tunnel release (Right, 2015); Knee arthroscopy (Left, ~ 2002); Posterior lumbar fusion 4 level (2005); ORIF finger / thumb fracture (Right, ~ 2012); Hand ligament reconstruction (Right, ~ 2013); Extracorporeal shock wave lithotripsy (2000's X 1); Fracture surgery; coronary angiogram (08/09/2014); percutaneous coronary stent intervention (pci-s) (08/09/2014); Colonoscopy with propofol (N/A, 11/02/2015); Back surgery; Total hip arthroplasty (Left, 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 Family: family history includes Deep vein thrombosis in his mother; Heart attack in his father; Parkinson's disease in his father.  Constitutional Exam  General appearance: Well nourished, well developed, and well hydrated. In no apparent acute distress There were no vitals filed for this visit. BMI Assessment: Estimated body mass index is 29.84 kg/m as calculated from the following:   Height as of 03/08/19: 5' 9.5" (1.765 m).   Weight as of 03/08/19: 205 lb (93 kg).  BMI interpretation table: BMI level  Category Range association with higher incidence of chronic pain  <18 kg/m2 Underweight   18.5-24.9 kg/m2 Ideal body weight   25-29.9 kg/m2 Overweight Increased incidence by 20%  30-34.9 kg/m2 Obese (Class I) Increased incidence by 68%  35-39.9 kg/m2 Severe obesity (Class II) Increased incidence by 136%  >40 kg/m2 Extreme obesity (Class III) Increased incidence by 254%   Patient's current BMI Ideal Body weight  There is no height or weight on file to calculate BMI. Patient weight not recorded   BMI Readings from Last 4 Encounters:  03/08/19 29.84 kg/m  02/09/19 30.27 kg/m  12/14/18 28.80 kg/m  10/20/17 30.72 kg/m   Wt Readings from Last 4 Encounters:  03/08/19 205 lb (93 kg)  02/09/19 205 lb (93 kg)  12/14/18 195 lb (88.5 kg)  10/20/17 208 lb (94.3 kg)  Psych/Mental status: Alert, oriented x 3 (person, place, & time)       Eyes: PERLA Respiratory: No evidence of acute respiratory distress  Cervical Spine Area Exam  Skin & Axial Inspection: No masses, redness, edema, swelling, or associated skin lesions Alignment: Symmetrical Functional ROM: Unrestricted ROM      Stability: No instability detected Muscle Tone/Strength: Functionally intact. No obvious neuro-muscular anomalies detected. Sensory (Neurological): Unimpaired Palpation: No palpable anomalies               Thoracic Spine Area Exam  Skin & Axial Inspection: No masses, redness, or swelling Alignment: Symmetrical Functional ROM: Unrestricted ROM Stability: No instability detected Muscle Tone/Strength: Functionally intact. No obvious neuro-muscular anomalies detected. Sensory (Neurological): Unimpaired Muscle strength & Tone: No palpable anomalies  Lumbar Spine Area Exam  Skin & Axial Inspection: Well healed scar from previous spine surgery detected Alignment: Symmetrical Functional ROM: Improved after treatment       Stability: No instability detected Muscle Tone/Strength: Increased muscle tone over affected  area Sensory (Neurological): Dermatomal pain pattern Palpation: No palpable anomalies       Provocative Tests: Hyperextension/rotation test: deferred today       Lumbar quadrant test (Kemp's test): deferred today       Lateral bending test: deferred today       Patrick's Maneuver: deferred today                   FABER* test: deferred today  S-I anterior distraction/compression test: deferred today         S-I lateral compression test: deferred today         S-I Thigh-thrust test: deferred today         S-I Gaenslen's test: deferred today         *(Flexion, ABduction and External Rotation)  Gait & Posture Assessment  Ambulation: Unassisted Gait: Relatively normal for age and body habitus Posture: WNL   Lower Extremity Exam    Side: Right lower extremity  Side: Left lower extremity  Stability: No instability observed          Stability: No instability observed          Skin & Extremity Inspection: Skin color, temperature, and hair growth are WNL. No peripheral edema or cyanosis. No masses, redness, swelling, asymmetry, or associated skin lesions. No contractures.  Skin & Extremity Inspection: Skin color, temperature, and hair growth are WNL. No peripheral edema or cyanosis. No masses, redness, swelling, asymmetry, or associated skin lesions. No contractures.  Functional ROM: Unrestricted ROM                  Functional ROM: Unrestricted ROM                  Muscle Tone/Strength: Functionally intact. No obvious neuro-muscular anomalies detected.  Muscle Tone/Strength: Functionally intact. No obvious neuro-muscular anomalies detected.  Sensory (Neurological): Dermatomal pain pattern        Sensory (Neurological): Dermatomal pain pattern        DTR: Patellar: 1+: trace Achilles: deferred today Plantar: deferred today  DTR: Patellar: 1+: trace Achilles: deferred today Plantar: deferred today  Palpation: No palpable anomalies  Palpation: No palpable anomalies    Assessment   Status Diagnosis  Responding Persistent Persistent 1. Chronic radicular lumbar pain   2. Lumbar post-laminectomy syndrome   3. History of lumbar fusion (L3-S1)       Plan of Care   Patient is a 68 year old male with a history of L3-S1 lumbar fusion.  Patient presents today for follow-up status post caudal epidural steroid injection.  Patient is noticing significant pain relief in regards to his low back pain after caudal ESI but continues to endorse pain and occasional weakness in bilateral lower extremities that radiates to approximately his posterior thigh and around his knee.  Of note, patient was initially referred here for consideration of spinal cord stimulator trial.  Patient has completed psychological evaluation and has also completed his thoracic and lumbar MRI.  I did express my concerns regarding suitability of percutaneous spinal cord stimulator trial.  Patient has a central disc protrusion at T8-T9 with annular fissure which partially effaces the ventral CSF space without spinal or foraminal stenosis along with T9-T10, T10-T11 subarticular disc bulge with mild posterior element hypertrophy.  At T11-T12 there seems to be a right foraminal component of disc superimposed on posterior element hypertrophy with severe right and mild left T11 foraminal stenosis.  Furthermore the patient's interlaminar windows at T11-T12 and T12-L1 and L1-L2 are fairly small and narrow.  There are osteophytes present as well.  I expressed my concerns about being able to safely get a 14-gauge epidural Touhy needle into such a small space.  The patient likely also has extensive granulation tissue at L1-L2 pedicle region which could further make it difficult to obtain access.  Given the patient's thoracic MRI findings with associated disc herniations, foraminal stenosis and most significantly posterior facet arthrosis, recommend patient see Dr. Mearl Latin  at [facility] for consideration of spinal cord  stimulator trial implant.  Patient endorsed understanding.  Will place referral below.  Given that the patient is experiencing significant pain relief in regards to his low back pain with caudal ESI, we discussed repeating.  This can be done in addition to SCS I informed him.  Patient endorsed understanding.  Plan: -Referral to Dr. Mearl Latin at [facility] for consideration of spinal cord stimulator trial and permanent implant -Repeat caudal ESI as below.  Caudal #2 without sedation   Orders:  Orders Placed This Encounter  Procedures  . Caudal Epidural Injection    Standing Status:   Future    Standing Expiration Date:   04/22/2019    Scheduling Instructions:     Laterality: Midline     Level(s): Sacrococcygeal canal (Tailbone area)     Sedation: without     Scheduling Timeframe: As soon as pre-approved    Order Specific Question:   Where will this procedure be performed?    Answer:   [facility]  . Ambulatory referral to Neurosurgery    Referral Priority:   Routine    Referral Type:   Surgical    Referral Reason:   Specialty Services Required    Referred to Provider:   Sarina Ill, MD    Requested Specialty:   Neurosurgery    Number of Visits Requested:   1    Referral Orders     Ambulatory referral to Neurosurgery Planned follow-up:   Return in about 2 weeks (around 04/05/2019) for Procedure CAUDAL #2 w/o sedation.     Referral to Dr. Mearl Latin at [facility] for consideration of SCS trial and implant.  Patient did obtain significant benefit in regards to his axial low back pain status post caudal ESI, plan on repeating #2.   Recent Visits Date Type Provider Dept  03/08/19 Procedure visit Gillis Santa, MD [facility]  02/09/19 Office Visit Gillis Santa, MD [facility]  Showing recent visits within past 90 days and meeting all other requirements   Today's Visits Date Type Provider Dept  03/22/19 Office Visit Gillis Santa, MD [facility]   Showing today's visits and meeting all other requirements   Future Appointments Date Type Provider Dept  04/05/19 Appointment Gillis Santa, MD [facility]  Showing future appointments within next 90 days and meeting all other requirements   Primary Care Physician: Tracie Harrier, MD Location: [facility] Note by: Gillis Santa, MD Date: 03/22/2019; Time: 2:54 PM  Note: This dictation was prepared with Dragon dictation. Any transcriptional errors that may result from this process are unintentional.

## 2019-03-22 NOTE — Patient Instructions (Addendum)
1. Glad that you got relief from caudal ESI, we will plan on repeating.  2. We've discussed your spine MRI and given your history of lumbar fusion and associated arthrosis, I recommend you see Dr Rosine Door for consideration of SCS trial and permament implant.  3. If you have not heard from [facility] or Dr Rosine Door in 1 week, please call our clinic and talk to [facility].  4. We will send over copies of your thoracic and lumbar MRI along with Psych eval to Dr Rosine Door and his team.   [email or web address]

## 2019-04-05 ENCOUNTER — Ambulatory Visit: Payer: BC Managed Care – PPO | Admitting: Student in an Organized Health Care Education/Training Program

## 2019-04-05 ENCOUNTER — Other Ambulatory Visit: Payer: Self-pay

## 2019-04-05 ENCOUNTER — Ambulatory Visit
Admission: RE | Admit: 2019-04-05 | Discharge: 2019-04-05 | Disposition: A | Discharge status: Home / Self Care | Payer: Medicare Other | Source: Ambulatory Visit | Attending: Student in an Organized Health Care Education/Training Program | Admitting: Student in an Organized Health Care Education/Training Program

## 2019-04-05 ENCOUNTER — Ambulatory Visit (HOSPITAL_BASED_OUTPATIENT_CLINIC_OR_DEPARTMENT_OTHER): Payer: Medicare Other | Admitting: Student in an Organized Health Care Education/Training Program

## 2019-04-05 ENCOUNTER — Encounter: Payer: Self-pay | Admitting: Student in an Organized Health Care Education/Training Program

## 2019-04-05 VITALS — BP 145/86 | HR 83 | Temp 97.3°F | Resp 16 | Ht 69.5 in | Wt 200.0 lb

## 2019-04-05 DIAGNOSIS — M5416 Radiculopathy, lumbar region: Secondary | ICD-10-CM

## 2019-04-05 DIAGNOSIS — G8929 Other chronic pain: Secondary | ICD-10-CM | POA: Diagnosis not present

## 2019-04-05 MED ORDER — SODIUM CHLORIDE 0.9% FLUSH
1.0000 mL | Freq: Once | INTRAVENOUS | Status: AC
  Administered 2019-04-05: 10:00:00 1 mL

## 2019-04-05 MED ORDER — IOHEXOL 180 MG/ML  SOLN
10.0000 mL | Freq: Once | INTRAMUSCULAR | Status: AC
  Administered 2019-04-05: 10:00:00 10 mL via EPIDURAL

## 2019-04-05 MED ORDER — DEXAMETHASONE SODIUM PHOSPHATE 10 MG/ML IJ SOLN
INTRAMUSCULAR | Status: AC
  Filled 2019-04-05: qty 1

## 2019-04-05 MED ORDER — LIDOCAINE HCL 2 % IJ SOLN
INTRAMUSCULAR | Status: AC
  Filled 2019-04-05: qty 20

## 2019-04-05 MED ORDER — ROPIVACAINE HCL 2 MG/ML IJ SOLN
1.0000 mL | Freq: Once | INTRAMUSCULAR | Status: AC
  Administered 2019-04-05: 10:00:00 1 mL via EPIDURAL

## 2019-04-05 MED ORDER — SODIUM CHLORIDE (PF) 0.9 % IJ SOLN
INTRAMUSCULAR | Status: AC
  Filled 2019-04-05: qty 10

## 2019-04-05 MED ORDER — DEXAMETHASONE SODIUM PHOSPHATE 10 MG/ML IJ SOLN
10.0000 mg | Freq: Once | INTRAMUSCULAR | Status: AC
  Administered 2019-04-05: 10:00:00 10 mg

## 2019-04-05 MED ORDER — LIDOCAINE HCL 2 % IJ SOLN
20.0000 mL | Freq: Once | INTRAMUSCULAR | Status: AC
  Administered 2019-04-05: 10:00:00 400 mg

## 2019-04-05 MED ORDER — ROPIVACAINE HCL 2 MG/ML IJ SOLN
INTRAMUSCULAR | Status: AC
  Filled 2019-04-05: qty 10

## 2019-04-05 NOTE — Patient Instructions (Signed)
Pain Management Discharge Instructions  General Discharge Instructions :  If you need to reach your doctor call: Monday-Friday 8:00 am - 4:00 pm at [phone] or toll free [phone].  After clinic hours [phone] to have operator reach doctor.  Bring all of your medication bottles to all your appointments in the pain clinic.  To cancel or reschedule your appointment with Pain Management please remember to call 24 hours in advance to avoid a fee.  Refer to the educational materials which you have been given on: General Risks, I had my Procedure. Discharge Instructions, Post Sedation.  Post Procedure Instructions:  The drugs you were given will stay in your system until tomorrow, so for the next 24 hours you should not drive, make any legal decisions or drink any alcoholic beverages.  You may eat anything you prefer, but it is better to start with liquids then soups and crackers, and gradually work up to solid foods.  Please notify your doctor immediately if you have any unusual bleeding, trouble breathing or pain that is not related to your normal pain.  Depending on the type of procedure that was done, some parts of your body may feel week and/or numb.  This usually clears up by tonight or the next day.  Walk with the use of an assistive device or accompanied by an adult for the 24 hours.  You may use ice on the affected area for the first 24 hours.  Put ice in a Ziploc bag and cover with a towel and place against area 15 minutes on 15 minutes off.  You may switch to heat after 24 hours.Epidural Steroid Injection Patient Information  Description: The epidural space surrounds the nerves as they exit the spinal cord.  In some patients, the nerves can be compressed and inflamed by a bulging disc or a tight spinal canal (spinal stenosis).  By injecting steroids into the epidural space, we can bring irritated nerves into direct contact with a potentially helpful medication.  These  steroids act directly on the irritated nerves and can reduce swelling and inflammation which often leads to decreased pain.  Epidural steroids may be injected anywhere along the spine and from the neck to the low back depending upon the location of your pain.   After numbing the skin with local anesthetic (like Novocaine), a small needle is passed into the epidural space slowly.  You may experience a sensation of pressure while this is being done.  The entire block usually last less than 10 minutes.  Conditions which may be treated by epidural steroids:   Low back and leg pain  Neck and arm pain  Spinal stenosis  Post-laminectomy syndrome  Herpes zoster (shingles) pain  Pain from compression fractures  Preparation for the injection:  1. Do not eat any solid food or dairy products within 8 hours of your appointment.  2. You may drink clear liquids up to 3 hours before appointment.  Clear liquids include water, black coffee, juice or soda.  No milk or cream please. 3. You may take your regular medication, including pain medications, with a sip of water before your appointment  Diabetics should hold regular insulin (if taken separately) and take 1/2 normal NPH dos the morning of the procedure.  Carry some sugar containing items with you to your appointment. 4. A driver must accompany you and be prepared to drive you home after your procedure.  5. Bring all your current medications with your. 6. An IV may be inserted and   sedation may be given at the discretion of the physician.   7. A blood pressure cuff, EKG and other monitors will often be applied during the procedure.  Some patients may need to have extra oxygen administered for a short period. 8. You will be asked to provide medical information, including your allergies, prior to the procedure.  We must know immediately if you are taking blood thinners (like Coumadin/Warfarin)  Or if you are allergic to IV iodine contrast (dye). We must  know if you could possible be pregnant.  Possible side-effects:  Bleeding from needle site  Infection (rare, may require surgery)  Nerve injury (rare)  Numbness & tingling (temporary)  Difficulty urinating (rare, temporary)  Spinal headache ( a headache worse with upright posture)  Light -headedness (temporary)  Pain at injection site (several days)  Decreased blood pressure (temporary)  Weakness in arm/leg (temporary)  Pressure sensation in back/neck (temporary)  Call if you experience:  Fever/chills associated with headache or increased back/neck pain.  Headache worsened by an upright position.  New onset weakness or numbness of an extremity below the injection site  Hives or difficulty breathing (go to the emergency room)  Inflammation or drainage at the infection site  Severe back/neck pain  Any new symptoms which are concerning to you  Please note:  Although the local anesthetic injected can often make your back or neck feel good for several hours after the injection, the pain will likely return.  It takes 3-7 days for steroids to work in the epidural space.  You may not notice any pain relief for at least that one week.  If effective, we will often do a series of three injections spaced 3-6 weeks apart to maximally decrease your pain.  After the initial series, we generally will wait several months before considering a repeat injection of the same type.  If you have any questions, please call [phone]  [facility] 

## 2019-04-05 NOTE — Progress Notes (Signed)
Safety precautions to be maintained throughout the outpatient stay will include: orient to surroundings, keep bed in low position, maintain call bell within reach at all times, provide assistance with transfer out of bed and ambulation.  

## 2019-04-05 NOTE — Progress Notes (Signed)
Patient's Name: Adam Lucas  MRN: [id number]  Referring Provider: Tracie Harrier, MD  DOB: 1951/03/31  PCP: Tracie Harrier, MD  DOS: 04/05/2019  Note by: Gillis Santa, MD  Service setting: Ambulatory outpatient  Specialty: Interventional Pain Management  Patient type: Established  Location: [facility] (AMB) Pain Management Facility  Visit type: Interventional Procedure   Primary Reason for Visit: Interventional Pain Management Treatment. CC: back pain and leg pain  Procedure:          Anesthesia, Analgesia, Anxiolysis:  Type: Therapeutic Epidural Steroid Injection #2  #1 done 03/08/2019 Region: Caudal Level: Sacrococcygeal   Laterality: Midline       Type: Local Anesthesia  Local Anesthetic: Lidocaine 1-2%  Position: Prone   Indications: 1. Chronic radicular lumbar pain    Pain Score: Pre-procedure: 7 /10 Post-procedure: 7 /10   Pre-op Assessment:  Adam Lucas is a 68 y.o. (year old), male patient, seen today for interventional treatment. He  has a past surgical history that includes left heart catheterization with coronary angiogram (N/A, 07/17/2014); Coronary angioplasty with stent (07/17/2014; 08/09/2014); Tonsillectomy and adenoidectomy (1970'S); Carpal tunnel release (Right, 2015); Knee arthroscopy (Left, ~ 2002); Posterior lumbar fusion 4 level (2005); ORIF finger / thumb fracture (Right, ~ 2012); Hand ligament reconstruction (Right, ~ 2013); Extracorporeal shock wave lithotripsy (2000's X 1); Fracture surgery; coronary angiogram (08/09/2014); percutaneous coronary stent intervention (pci-s) (08/09/2014); Colonoscopy with propofol (N/A, 11/02/2015); Back surgery; Total hip arthroplasty (Left, 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 Adam Lucas has a current medication list which includes the following prescription(s): acetaminophen, aspirin ec, carvedilol, cholecalciferol, duloxetine, ergocalciferol, fluticasone, gabapentin, hydrochlorothiazide, losartan-hydrochlorothiazide, magnesium oxide,  nitroglycerin, pravastatin, testosterone cypionate, thiamine, tramadol, turmeric curcumin, vitamin b-12, vitamin c, diazepam, and enoxaparin. His primarily concern today is the Back Pain (lower)  Initial Vital Signs:  Pulse/HCG Rate: 83ECG Heart Rate: 77 Temp: (!) 97.3 F (36.3 C) Resp: 16 BP: 135/80 SpO2: 99 %  BMI: Estimated body mass index is 29.11 kg/m as calculated from the following:   Height as of this encounter: 5' 9.5" (1.765 m).   Weight as of this encounter: 200 lb (90.7 kg).  Risk Assessment: Allergies: Reviewed. He is allergic to atorvastatin and crestor [rosuvastatin calcium].  Allergy Precautions: None required Coagulopathies: Reviewed. None identified.  Blood-thinner therapy: None at this time Active Infection(s): Reviewed. None identified. Adam Lucas is afebrile  Site Confirmation: Adam Lucas was asked to confirm the procedure and laterality before marking the site Procedure checklist: Completed Consent: Before the procedure and under the influence of no sedative(s), amnesic(s), or anxiolytics, the patient was informed of the treatment options, risks and possible complications. To fulfill our ethical and legal obligations, as recommended by the American Medical Association's Code of Ethics, I have informed the patient of my clinical impression; the nature and purpose of the treatment or procedure; the risks, benefits, and possible complications of the intervention; the alternatives, including doing nothing; the risk(s) and benefit(s) of the alternative treatment(s) or procedure(s); and the risk(s) and benefit(s) of doing nothing. The patient was provided information about the general risks and possible complications associated with the procedure. These may include, but are not limited to: failure to achieve desired goals, infection, bleeding, organ or nerve damage, allergic reactions, paralysis, and death. In addition, the patient was informed of those risks and complications  associated to Spine-related procedures, such as failure to decrease pain; infection (i.e.: Meningitis, epidural or intraspinal abscess); bleeding (i.e.: epidural hematoma, subarachnoid hemorrhage, or any other type of intraspinal or peri-dural bleeding); organ  or nerve damage (i.e.: Any type of peripheral nerve, nerve root, or spinal cord injury) with subsequent damage to sensory, motor, and/or autonomic systems, resulting in permanent pain, numbness, and/or weakness of one or several areas of the body; allergic reactions; (i.e.: anaphylactic reaction); and/or death. Furthermore, the patient was informed of those risks and complications associated with the medications. These include, but are not limited to: allergic reactions (i.e.: anaphylactic or anaphylactoid reaction(s)); adrenal axis suppression; blood sugar elevation that in diabetics may result in ketoacidosis or comma; water retention that in patients with history of congestive heart failure may result in shortness of breath, pulmonary edema, and decompensation with resultant heart failure; weight gain; swelling or edema; medication-induced neural toxicity; particulate matter embolism and blood vessel occlusion with resultant organ, and/or nervous system infarction; and/or aseptic necrosis of one or more joints. Finally, the patient was informed that Medicine is not an exact science; therefore, there is also the possibility of unforeseen or unpredictable risks and/or possible complications that may result in a catastrophic outcome. The patient indicated having understood very clearly. We have given the patient no guarantees and we have made no promises. Enough time was given to the patient to ask questions, all of which were answered to the patient's satisfaction. Adam Lucas has indicated that he wanted to continue with the procedure. Attestation: I, the ordering provider, attest that I have discussed with the patient the benefits, risks, side-effects,  alternatives, likelihood of achieving goals, and potential problems during recovery for the procedure that I have provided informed consent. Date  Time: 04/05/2019  9:30 AM  Pre-Procedure Preparation:  Monitoring: As per clinic protocol. Respiration, ETCO2, SpO2, BP, heart rate and rhythm monitor placed and checked for adequate function Safety Precautions: Patient was assessed for positional comfort and pressure points before starting the procedure. Time-out: I initiated and conducted the "Time-out" before starting the procedure, as per protocol. The patient was asked to participate by confirming the accuracy of the "Time Out" information. Verification of the correct person, site, and procedure were performed and confirmed by me, the nursing staff, and the patient. "Time-out" conducted as per Joint Commission's Universal Protocol (UP.01.01.01). Time: 1014  Description of Procedure:          Target Area: Caudal Epidural Canal. Approach: Midline approach. Area Prepped: Entire Posterior Sacrococcygeal Region Prepping solution: DuraPrep (Iodine Povacrylex [0.7% available iodine] and Isopropyl Alcohol, 74% w/w) Safety Precautions: Aspiration looking for blood return was conducted prior to all injections. At no point did we inject any substances, as a needle was being advanced. No attempts were made at seeking any paresthesias. Safe injection practices and needle disposal techniques used. Medications properly checked for expiration dates. SDV (single dose vial) medications used. Description of the Procedure: Protocol guidelines were followed. The patient was placed in position over the fluoroscopy table. The target area was identified and the area prepped in the usual manner. Skin & deeper tissues infiltrated with local anesthetic. Appropriate amount of time allowed to pass for local anesthetics to take effect. The procedure needles were then advanced to the target area. Proper needle placement secured.  Negative aspiration confirmed. Solution injected in intermittent fashion, asking for systemic symptoms every 0.5cc of injectate. The needles were then removed and the area cleansed, making sure to leave some of the prepping solution back to take advantage of its long term bactericidal properties. Vitals:   04/05/19 0931 04/05/19 1014 04/05/19 1019  BP: 135/80 140/90 (!) 145/86  Pulse: 83    Resp: 16  14 16  Temp: (!) 97.3 F (36.3 C)    TempSrc: Temporal    SpO2: 99% 99% 98%  Weight: 200 lb (90.7 kg)    Height: 5' 9.5" (1.765 m)      Start Time: 1014 hrs. End Time: 1019 hrs. Materials:  Needle(s) Type: Epidural needle Gauge: 22G Length: 3.5-in Medication(s): Please see orders for medications and dosing details. 9 cc solution made of 5 cc of preservative-free saline, 3 cc of 0.2% ropivacaine, 1 cc of Decadron 10 mg/cc. Imaging Guidance (Spinal):          Type of Imaging Technique: Fluoroscopy Guidance (Spinal) Indication(s): Assistance in needle guidance and placement for procedures requiring needle placement in or near specific anatomical locations not easily accessible without such assistance. Exposure Time: Please see nurses notes. Contrast: Before injecting any contrast, we confirmed that the patient did not have an allergy to iodine, shellfish, or radiological contrast. Once satisfactory needle placement was completed at the desired level, radiological contrast was injected. Contrast injected under live fluoroscopy. No contrast complications. See chart for type and volume of contrast used. Fluoroscopic Guidance: I was personally present during the use of fluoroscopy. "Tunnel Vision Technique" used to obtain the best possible view of the target area. Parallax error corrected before commencing the procedure. "Direction-depth-direction" technique used to introduce the needle under continuous pulsed fluoroscopy. Once target was reached, antero-posterior, oblique, and lateral fluoroscopic  projection used confirm needle placement in all planes. Images permanently stored in EMR. Interpretation: I personally interpreted the imaging intraoperatively. Adequate needle placement confirmed in multiple planes. Appropriate spread of contrast into desired area was observed. No evidence of afferent or efferent intravascular uptake. No intrathecal or subarachnoid spread observed. Permanent images saved into the patient's record.  Antibiotic Prophylaxis:   Anti-infectives (From admission, onward)   None     Indication(s): None identified  Post-operative Assessment:  Post-procedure Vital Signs:  Pulse/HCG Rate: 8377 Temp: (!) 97.3 F (36.3 C) Resp: 16 BP: (!) 145/86 SpO2: 98 %  EBL: None  Complications: No immediate post-treatment complications observed by team, or reported by patient.  Note: The patient tolerated the entire procedure well. A repeat set of vitals were taken after the procedure and the patient was kept under observation following institutional policy, for this type of procedure. Post-procedural neurological assessment was performed, showing return to baseline, prior to discharge. The patient was provided with post-procedure discharge instructions, including a section on how to identify potential problems. Should any problems arise concerning this procedure, the patient was given instructions to immediately contact us, at any time, without hesitation. In any case, we plan to contact the patient by telephone for a follow-up status report regarding this interventional procedure.  Comments:  No additional relevant information. 5 out of 5 strength bilateral lower extremity: Plantar flexion, dorsiflexion, knee flexion, knee extension.  Plan of Care  Orders:  Orders Placed This Encounter  Procedures  . DG PAIN CLINIC C-ARM 1-60 MIN NO REPORT    Intraoperative interpretation by procedural physician at [place].    Standing Status:   Standing    Number of  Occurrences:   1    Order Specific Question:   Reason for exam:    Answer:   Assistance in needle guidance and placement for procedures requiring needle placement in or near specific anatomical locations not easily accessible without such assistance.   Medications ordered for procedure: Meds ordered this encounter  Medications  . iohexol (OMNIPAQUE) 180 MG/ML injection 10 mL  Must be Myelogram-compatible. If not available, you may substitute with a water-soluble, non-ionic, hypoallergenic, myelogram-compatible radiological contrast medium.  Marland Kitchen lidocaine (XYLOCAINE) 2 % (with pres) injection 400 mg  . ropivacaine (PF) 2 mg/mL (0.2%) (NAROPIN) injection 1 mL  . dexamethasone (DECADRON) injection 10 mg  . sodium chloride flush (NS) 0.9 % injection 1 mL   Medications administered: We administered iohexol, lidocaine, ropivacaine (PF) 2 mg/mL (0.2%), dexamethasone, and sodium chloride flush.  See the medical record for exact dosing, route, and time of administration.  Follow-up plan:   Return in about 4 weeks (around 05/03/2019) for Post Procedure Evaluation, virtual.     Recent Visits Date Type Provider Dept  03/22/19 Office Visit Gillis Santa, MD [facility]  03/08/19 Procedure visit Gillis Santa, MD [facility]  02/09/19 Office Visit Gillis Santa, MD [facility]  Showing recent visits within past 90 days and meeting all other requirements   Today's Visits Date Type Provider Dept  04/05/19 Procedure visit Gillis Santa, MD [facility]  Showing today's visits and meeting all other requirements   Future Appointments Date Type Provider Dept  05/03/19 Appointment Gillis Santa, MD [facility]  Showing future appointments within next 90 days and meeting all other requirements   Disposition: Discharge home  Discharge Date & Time: 04/05/2019; 1030 hrs.   Primary Care Physician: Tracie Harrier, MD Location: [facility] Note by: Gillis Santa, MD Date: 04/05/2019; Time: 10:37 AM  Disclaimer:  Medicine is not an exact science. The only guarantee in medicine is that nothing is guaranteed. It is important to note that the decision to proceed with this intervention was based on the information collected from the patient. The Data and conclusions were drawn from the patient's questionnaire, the interview, and the physical examination. Because the information was provided in large part by the patient, it cannot be guaranteed that it has not been purposely or unconsciously manipulated. Every effort has been made to obtain as much relevant data as possible for this evaluation. It is important to note that the conclusions that lead to this procedure are derived in large part from the available data. Always take into account that the treatment will also be dependent on availability of resources and existing treatment guidelines, considered by other Pain Management Practitioners as being common knowledge and practice, at the time of the intervention. For Medico-Legal purposes, it is also important to point out that variation in procedural techniques and pharmacological choices are the acceptable norm. The indications, contraindications, technique, and results of the above procedure should only be interpreted and judged by a Board-Certified Interventional Pain Specialist with extensive familiarity and expertise in the same exact procedure and technique.

## 2019-04-06 NOTE — Telephone Encounter (Signed)
No answer- Left message to call if needed 

## 2019-04-08 ENCOUNTER — Other Ambulatory Visit: Payer: Self-pay

## 2019-04-08 ENCOUNTER — Encounter: Payer: Medicare Other | Attending: Psychology | Admitting: Psychology

## 2019-04-08 ENCOUNTER — Encounter: Payer: Self-pay | Admitting: Psychology

## 2019-04-08 DIAGNOSIS — I252 Old myocardial infarction: Secondary | ICD-10-CM | POA: Diagnosis not present

## 2019-04-08 DIAGNOSIS — I2510 Atherosclerotic heart disease of native coronary artery without angina pectoris: Secondary | ICD-10-CM | POA: Insufficient documentation

## 2019-04-08 DIAGNOSIS — G629 Polyneuropathy, unspecified: Secondary | ICD-10-CM | POA: Insufficient documentation

## 2019-04-08 DIAGNOSIS — G894 Chronic pain syndrome: Secondary | ICD-10-CM

## 2019-04-08 DIAGNOSIS — I10 Essential (primary) hypertension: Secondary | ICD-10-CM | POA: Insufficient documentation

## 2019-04-08 DIAGNOSIS — Z820 Family history of epilepsy and other diseases of the nervous system: Secondary | ICD-10-CM | POA: Diagnosis not present

## 2019-04-08 DIAGNOSIS — Z8249 Family history of ischemic heart disease and other diseases of the circulatory system: Secondary | ICD-10-CM | POA: Diagnosis not present

## 2019-04-08 DIAGNOSIS — M961 Postlaminectomy syndrome, not elsewhere classified: Secondary | ICD-10-CM | POA: Diagnosis present

## 2019-04-08 DIAGNOSIS — Z955 Presence of coronary angioplasty implant and graft: Secondary | ICD-10-CM | POA: Diagnosis not present

## 2019-04-08 NOTE — Progress Notes (Signed)
Neuropsychological Evaluation   Patient:                           Adam Lucas               DOB:                               03/17/1959   MR Number:                  [id number]   Location:                        [facility] [place], [place] [id number] [place] [address] Dept: [phone]   Date of Service:                     04/08/2019   Start Time:                              8 AM End Time:                               9 AM   Provider/Observer:               Ilean Skill, Psy.D.                                                   Clinical Neuropsychologist  Today's visit included the patient completing the [place] multiphasic personality inventory-II as well as the pain patient profile.  These objective psychological/neuropsychological test measures were then scored and interpreted with formal report writing today.   Reason for Service:  BRENDA MILUM is a 68 year old male referred by Gillis Santa, MD for psychological evaluation as part of the standard protocol for work-up considering spinal cord stimulator trialing and possible implantation.  The patient has been dealing with chronic pain for some time.  The patient has a history of coronary artery disease and post-infarction angina.  The patient also has a history of myocardial infarction with little to no residual heart wall damage.  The patient has coronary artery disease, carpal tunnel, hypertension, hyperglycemia and total hip replacement.  The primary issue for his pain is lumbar post-laminectomy syndrome with history of lumbar fusion (L3-S1), chronic radicular lumbar pain and chronic pain syndrome.  The patient describes continued difficulty with lifting, walking and doing other chores and stability when walking.  The patient reports that he has had these back issues for years and had major back surgery in  2005.  The patient denies any significant life stressors and reports that family situation and financial situation are all stable.  The patient describes good sleep with the exception of waking up sometimes due to pain.  Good appetite and memory are also noted.  Testing Administered:              The patient was administered and completed the [place] multiphasic personality inventory and the pain patient profile (P3).   Participation Level:  Active   Participation Quality:                       Appropriate and Attentive                            Behavioral Observation:                  Well Groomed, Alert, and Appropriate.    Test Results:    Initially, the patient completed the [place] multiphasic personality inventory.  The resulting validity scales suggest that the patient approach this measure in an honest and straightforward manner neither attempting to exaggerate or minimize his current symptomatology.    The resulting basic/clinical scales showed no clinical elevations in any measures other than the clinical scale related to preoccupation with physical symptoms (Scale 1, T=77). The elevation in this scale appears to be specifically related to his chronic pain symptoms and not the presence of somatic delusions or fear of illness/disease. No other basic/clinical scales were clinically elevated and there were no indications of significant depression, anxiety, anger, manic or other bipolar type symptoms, psychosis or other more significant or serious psychiatric issues.   Further analysis utilizing content and supplemental scales do show some mild elevations with regard to health concerns which are consistent with his medical status.  The patient has no significant elevation in either of the posttraumatic stress disorder scales and does not have a clinical sleep significant elevation on the McAndrews scale that has been shown to be sensitive to vulnerabilities to  alcohol or other substance abuse.  The patient does appear to be well skilled and social skills and social knowledge and does not have difficulties with significant interpersonal relationships and there is no social withdrawal or other vulnerabilities that would leave him isolated in life.   The patient then completed the pain patient profile (P3).  This measure is not only normed on a normative/community-based sample but also is normed on a sample of patients with chronic pain symptoms without psychiatric issues.  This allows for further breakdown of issues such as depression anxiety that can be elevated on the MMPI that are more related to chronic pain symptoms rather than psychiatric issues.  The patient produced a valid profile with no indication that he is either exaggerating or minimizing his current symptomatology on this measure either.  The patient's response pattern suggests he is less depressed (T=44) and anxious (T=38) than the average chronic pain patient (e.g. without significant psychological/psychiatric symptomatology). The patient's score on the Somatization scale (T=53) is average for a pain patient. His score suggests he is concerned about and attentive to his health related problems and symptoms, but somatic issues do not occupy an undue amount of his attention. Individuals with a clearly defined organic basis for pain often respond in this manner. Although he is cognitively and emotionally distressed by his physical symptoms, his score suggests he has the ability to actively participate in a treatment plan for pain relief without major interference from excessive somatic thought.   Summary of Results:                        Overall, the results of the current psychological/neuropsychological evaluation do not indicate any significant psychological/psychiatric or psychosocial issues outside of a community/normative sample.  The patient does have a mild elevation on somatic symptoms but these  are almost exclusively related to physical  difficulties and pain. The patient also shows no indication of any significant cognitive deficits or impairments.  Overall, there are no indications of psychological or psychiatric issues that would have a deleterious effect on either the trialing phase or post implantation phase.   Impression/Diagnosis:                     Overall, the results of the current psychological/neuropsychological evaluation do strongly suggest that the patient is an excellent candidate from a psychological/psychiatric perspective for spinal cord stimulator trialing and possible implantation.  During the clinical interview and review of available medical records there were no indications of any significant psychosocial stressors that would complicate his ability to accurately interpret and understand any results that he would experience during the spinal cord stimulator trialing phase.  The patient shows no indication of cognitive difficulties that would limit his ability to follow through with any postoperative recommendations.  There are also no indications on objective psychological/neuropsychological measures that would suggest any clinically significant levels of depression, anxiety, mood disturbance, or more significant psychiatric issues.  Again, the patient does appear to be an excellent candidate for spinal cord stimulator trialing and possible implantation.   Diagnosis:                                Axis I: Polyneuropathy   Lumbar post-laminectomy syndrome   Chronic pain syndrome     Ilean Skill, Psy.D. Neuropsychologist

## 2019-04-29 ENCOUNTER — Telehealth: Payer: Self-pay

## 2019-04-29 ENCOUNTER — Encounter: Payer: Self-pay | Admitting: Student in an Organized Health Care Education/Training Program

## 2019-04-29 NOTE — Telephone Encounter (Signed)
LM for patient to return our call.

## 2019-04-29 NOTE — Telephone Encounter (Signed)
Patient has called and lvmail x2

## 2019-04-30 ENCOUNTER — Encounter: Payer: Self-pay | Admitting: Student in an Organized Health Care Education/Training Program

## 2019-05-03 ENCOUNTER — Other Ambulatory Visit: Payer: Self-pay

## 2019-05-03 ENCOUNTER — Ambulatory Visit
Payer: Medicare Other | Attending: Student in an Organized Health Care Education/Training Program | Admitting: Student in an Organized Health Care Education/Training Program

## 2019-05-03 ENCOUNTER — Encounter: Payer: Self-pay | Admitting: Student in an Organized Health Care Education/Training Program

## 2019-05-03 DIAGNOSIS — G629 Polyneuropathy, unspecified: Secondary | ICD-10-CM

## 2019-05-03 DIAGNOSIS — G8929 Other chronic pain: Secondary | ICD-10-CM

## 2019-05-03 DIAGNOSIS — M5416 Radiculopathy, lumbar region: Secondary | ICD-10-CM

## 2019-05-03 DIAGNOSIS — M961 Postlaminectomy syndrome, not elsewhere classified: Secondary | ICD-10-CM

## 2019-05-03 DIAGNOSIS — Z981 Arthrodesis status: Secondary | ICD-10-CM | POA: Diagnosis not present

## 2019-05-03 DIAGNOSIS — G894 Chronic pain syndrome: Secondary | ICD-10-CM

## 2019-05-03 NOTE — Progress Notes (Signed)
Pain Management Virtual Encounter Note - Virtual Visit via [place] (real-time audio visits between healthcare provider and patient).   Patient's Phone No. & Preferred Pharmacy:  [phone] (home); [phone] (mobile); (Preferred) [phone] [email or web address]  [facility], [place] [id number] Phone: [phone] Fax: [place], [place]. [place] [address] Phone: [phone] Fax: [phone]    Pre-screening note:  Our staff contacted Adam Lucas and offered him an "in person", "face-to-face" appointment versus a telephone encounter. He indicated preferring the telephone encounter, at this time.   Reason for Virtual Visit: COVID-19*  Social distancing based on CDC and AMA recommendations.   I contacted Adam Lucas on 05/03/2019 via video conference.      I clearly identified myself as Gillis Santa, MD. I verified that I was speaking with the correct person using two identifiers (Name: Adam Lucas, and date of birth: 20-Sep-1950).  Advanced Informed Consent I sought verbal advanced consent from Adam Lucas for virtual visit interactions. I informed Adam Lucas of possible security and privacy concerns, risks, and limitations associated with providing "not-in-person" medical evaluation and management services. I also informed Adam Lucas of the availability of "in-person" appointments. Finally, I informed him that there would be a charge for the virtual visit and that he could be  personally, fully or partially, financially responsible for it. Adam Lucas expressed understanding and agreed to proceed.   Historic Elements   Adam Lucas is a 68 y.o. year old, male patient evaluated today after his last encounter by our practice on 04/29/2019. Adam Lucas  has a past medical history of Chronic lower back pain, Coronary artery disease,  Degenerative arthritis, Heart murmur (~ 1958), High cholesterol, History of kidney stones, Hypertension, Kidney stones, Migraine, Myocardial infarction ([place]) (07/17/2014), and Neuropathy. He also  has a past surgical history that includes left heart catheterization with coronary angiogram (N/A, 07/17/2014); Coronary angioplasty with stent (07/17/2014; 08/09/2014); Tonsillectomy and adenoidectomy (1970'S); Carpal tunnel release (Right, 2015); Knee arthroscopy (Left, ~ 2002); Posterior lumbar fusion 4 level (2005); ORIF finger / thumb fracture (Right, ~ 2012); Hand ligament reconstruction (Right, ~ 2013); Extracorporeal shock wave lithotripsy (2000's X 1); Fracture surgery; coronary angiogram (08/09/2014); percutaneous coronary stent intervention (pci-s) (08/09/2014); Colonoscopy with propofol (N/A, 11/02/2015); Back surgery; Total hip arthroplasty (Left, 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 Adam Lucas has a current medication list which includes the following prescription(s): acetaminophen, aspirin ec, carvedilol, cholecalciferol, diazepam, duloxetine, ergocalciferol, fluticasone, gabapentin, magnesium oxide, nitroglycerin, pravastatin, testosterone cypionate, thiamine, tramadol, turmeric curcumin, vitamin b-12, vitamin c, enoxaparin, hydrochlorothiazide, and losartan-hydrochlorothiazide. He  reports that he has never smoked. He has never used smokeless tobacco. He reports current alcohol use. He reports that he does not use drugs. Adam Lucas is allergic to atorvastatin and crestor [rosuvastatin calcium].   HPI  Today, he is being contacted for a post-procedure assessment.  Evaluation of last interventional procedure  04/05/2019 Procedure: CAUDAL ESI #2 Pre-procedure pain score:  7/10 Post-procedure pain score: 7/10         Influential Factors: Intra-procedural challenges: None observed.         Reported side-effects: None.        Post-procedural adverse reactions or complications: None reported         Sedation:  Please see nurses note for DOS. When no sedatives are used, the analgesic levels obtained are  directly associated to the effectiveness of the local anesthetics. However, when sedation is provided, the level of analgesia obtained during the initial 1 hour following the intervention, is believed to be the result of a combination of factors. These factors may include, but are not limited to: 1. The effectiveness of the local anesthetics used. 2. The effects of the analgesic(s) and/or anxiolytic(s) used. 3. The degree of discomfort experienced by the patient at the time of the procedure. 4. The patients ability and reliability in recalling and recording the events. 5. The presence and influence of possible secondary gains and/or psychosocial factors. Reported result: Relief experienced during the 1st hour after the procedure: 100%   (Ultra-Short Term Relief)            Interpretative annotation: Clinically appropriate result. Analgesia during this period is likely to be Local Anesthetic and/or IV Sedative (Analgesic/Anxiolytic) related.          Effects of local anesthetic: The analgesic effects attained during this period are directly associated to the localized infiltration of local anesthetics and therefore cary significant diagnostic value as to the etiological location, or anatomical origin, of the pain. Expected duration of relief is directly dependent on the pharmacodynamics of the local anesthetic used. Long-acting (4-6 hours) anesthetics used.  Reported result: Relief during the next 4 to 6 hour after the procedure: 100%   (Short-Term Relief)            Interpretative annotation: Clinically appropriate result. Analgesia during this period is likely to be Local Anesthetic-related.          Long-term benefit: Defined as the period of time past the expected duration of local anesthetics (1 hour for short-acting and 4-6 hours for long-acting). With the possible exception of prolonged sympathetic  blockade from the local anesthetics, benefits during this period are typically attributed to, or associated with, other factors such as analgesic sensory neuropraxia, antiinflammatory effects, or beneficial biochemical changes provided by agents other than the local anesthetics.  Reported result: Extended relief following procedure: 100% for 2 weeks now pain is gradually returning, rated as approximately 40% pain relief currently.   (Long-Term Relief)            Interpretative annotation: Clinically appropriate result. Good relief. No permanent benefit expected. Inflammation plays a part in the etiology to the pain.          Plan: Repeat caudal ESI #3  Laboratory Chemistry Profile (12 mo)  Renal: No results found for requested labs within last 8760 hours.  Lab Results  Component Value Date   GFRAA >60 10/08/2017   GFRNONAA >60 10/08/2017   Hepatic: No results found for requested labs within last 8760 hours. Lab Results  Component Value Date   AST 21 10/08/2017   ALT 19 10/08/2017   Other: No results found for requested labs within last 8760 hours. Note: Above Lab results reviewed.  Imaging  Last 90 days:  Adam Lucas  Result Date: 02/27/2019 CLINICAL DATA:  68 year old male with chronic mid back pain radiating to the hips. No known injury. EXAM: MRI THORACIC SPINE WITHOUT Lucas TECHNIQUE: Multiplanar, multisequence Adam imaging of the thoracic spine was performed. No intravenous Lucas was administered. COMPARISON:  No prior thoracic imaging. FINDINGS: Limited cervical spine imaging: Straightening of cervical lordosis with evidence of C4-C5 and C5-C6 disc degeneration. Thoracic spine segmentation:  Appears normal. Alignment: Preserved thoracic kyphosis. Mild degenerative anterolisthesis at T2-T3. Normal cervicothoracic junction alignment. Vertebrae: There is confluent marrow edema at  the posterior T11 vertebral body which on axial images appears related to a Schmorl's  node (series 20, image 34). Mild adjacent degenerative appearing T12 anterior and leftward endplate marrow edema. Normal background bone marrow signal. Small thoracic Schmorl's nodes elsewhere. No other acute osseous abnormality identified. Cord: Spinal cord signal is within normal limits at all visualized levels. Partially visible conus medullaris at T12-L1 appears normal. Paraspinal and other soft tissues: Negative. Disc levels: T1-T2: Small central disc protrusion.  No stenosis. T2-T3: Disc bulge with broad-based posterior component. No stenosis. T3-T4: Negative. T4-T5: Negative. T5-T6: Mild ligament flavum hypertrophy.  No stenosis. T6-T7: Negative. T7-T8: Minimal disc bulge.  No stenosis. T8-T9: Central disc protrusion with annular fissure (series 20, image 26) partially effaces the ventral CSF space without spinal or foraminal stenosis. T9-T10: Subarticular disc bulging with mild posterior element hypertrophy. No spinal stenosis. Moderate bilateral T9 foraminal stenosis. T10-T11: Circumferential disc bulge with broad-based posterior component. Posterior element hypertrophy. No spinal stenosis. Moderate bilateral T10 foraminal stenosis. T11-T12: Mild circumferential disc bulge. There also seems to be a right foraminal component of disc superimposed on posterior element hypertrophy. No spinal stenosis. Severe right and mild left T11 foraminal stenosis. IMPRESSION: 1. T11 inferior endplate marrow edema appears related to a degenerative Schmorl's node. Questionable right foraminal component of disc at that level contributing to severe right T11 foraminal stenosis. 2. Small disc herniation with annular fissure at T8-T9 but no significant stenosis. 3. Lesser thoracic disc degeneration elsewhere. Moderate bilateral T9 and T10 neural foraminal stenosis. Electronically Signed   By: Genevie Ann M.D.   On: 02/27/2019 21:04   Adam Lucas  Result Date: 02/27/2019 CLINICAL DATA:  68 year old male with  chronic in mid back pain radiating to the hips. No known injury. Prior surgery. EXAM: MRI LUMBAR SPINE WITHOUT Lucas TECHNIQUE: Multiplanar, multisequence Adam imaging of the lumbar spine was performed. No intravenous Lucas was administered. COMPARISON:  Thoracic spine MRI today reported separately. Lumbar MRI 05/05/2017. CT lumbar spine 11/24/2018. FINDINGS: Segmentation: Transitional anatomy with full size ribs at what was designated the L1 level by CT in June. The same numbering system was used on the 2018 MRI. However, when numbering from the skull base today it becomes apparent that the L5 level is sacralized, as marrow edema is redemonstrated at the T11 posteroinferior endplate on these images. By this numbering system the previous posterior fusion is from L2-L3 to L4-L5. Correlation with radiographs is recommended prior to any operative intervention. Alignment: Stable lumbar lordosis. Subtle chronic anterolisthesis of L4 on L5. Vertebrae: Hardware susceptibility artifact from the L3 to the L5 level. No marrow edema or evidence of acute osseous abnormality in the lumbar spine. Intact visible sacrum and SI joints. Conus medullaris and cauda equina: Conus extends to the T12-L1 level. No lower spinal cord or conus signal abnormality. Paraspinal and other soft tissues: Bulky left nephrolithiasis, better demonstrated by CT. No hydronephrosis or proximal hydroureter today. Postoperative changes to the posterior paraspinal soft tissues with no adverse features. Disc levels: T12-L1: Mild far lateral disc bulging and posterior element hypertrophy. Mild bilateral T12 foraminal stenosis. L1-L2: Far lateral disc bulging and mild to moderate posterior element hypertrophy. However, no stenosis. L2-L3:  Prior posterior decompression and fusion.  No stenosis. L3-L4:  Prior posterior decompression and fusion.  No stenosis. L4-L5: Prior posterior decompression and fusion. Mild right side osseous foraminal stenosis is stable  since 2018. No other stenosis. L5-S1:  Sacralized, with only a vestigial disc.  No stenosis. IMPRESSION: 1.  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 Correlation with radiographs is recommended prior to any operative intervention. 2. No lumbar spinal stenosis or acute neural impingement. Chronic right side osseous foraminal stenosis at the lowest fuse level is stable since 2018. 3. Bulky chronic left nephrolithiasis/staghorn calculus. Electronically Signed   By: Genevie Ann M.D.   On: 02/27/2019 21:19   [facility] C-arm 1-60 Min No Report  Result Date: 04/05/2019 Fluoro was used, but no Radiologist interpretation will be provided. Please refer to "NOTES" tab for provider progress note.  Dg Pain Clinic C-arm 1-60 Min No Report  Result Date: 03/08/2019 Fluoro was used, but no Radiologist interpretation will be provided. Please refer to "NOTES" tab for provider progress note.   Assessment  The primary encounter diagnosis was Chronic radicular lumbar pain. Diagnoses of Lumbar post-laminectomy syndrome, History of lumbar fusion (L3-S1), Polyneuropathy, and Chronic pain syndrome were also pertinent to this visit.  Plan of Care  I am having Adam Lucas maintain his DULoxetine, nitroGLYCERIN, carvedilol, pravastatin, testosterone cypionate, gabapentin, vitamin B-12, vitamin C, acetaminophen, fluticasone, Turmeric Curcumin, cholecalciferol, traMADol, enoxaparin, losartan-hydrochlorothiazide, hydrochlorothiazide, magnesium oxide, aspirin EC, thiamine, ergocalciferol, and diazepam.  Virtual visit status post caudal ESI #2 performed on 04/05/2019.  Patient states that he obtained significant pain relief after his second caudal ESI.  He continues to obtain pain relief  at the moment.  He states that, #2 was more effective than #1.  He has less pain at night and is able to perform ADLs more comfortably.  He would like to have a another caudal injection before the New Year's.  This is reasonable.  We will order as below.  Patient also saw Merla Riches at [facility] for consideration of spinal cord stimulator trial.  Patient is scheduled for early December for tunneled trial with Dr. Mearl Latin.  Plan: -Caudal ESI #3 without sedation -Continue with spinal cord stimulator trial, tunneled with Dr. Mearl Latin at [facility]   Orders:  Orders Placed This Encounter  Procedures  . Caudal ESI (Schedule)    Standing Status:   Future    Standing Expiration Date:   06/02/2019    Scheduling Instructions:     Laterality: Midline     Level(s): Sacrococcygeal canal (Tailbone area)     Sedation: without     Scheduling Timeframe: 3-4 weeks    Order Specific Question:   Where will this procedure be performed?    Answer:   [facility]   Follow-up plan:   Return in about 4 weeks (around 05/31/2019) for  caudal #3 (good benefit with #2), without sedation.     Referral to Dr. Mearl Latin at [facility] for spinal cord stimulator trial: Scheduled for January.  Patient did obtain significant benefit in regards to his axial low back pain status post caudal ESI #2, plan on repeating # 3 prior to new year.    Recent Visits Date Type Provider Dept  04/05/19 Procedure visit Gillis Santa, MD [facility]  03/22/19 Office Visit Gillis Santa, MD [facility]  03/08/19 Procedure visit Gillis Santa, MD [facility]  02/09/19 Office Visit Gillis Santa, MD [facility]  Showing recent visits within past 90 days and meeting all other requirements   Today's Visits Date Type Provider Dept  05/03/19 Office  Visit Gillis Santa, MD [facility]  Showing today's visits and meeting all other requirements   Future Appointments No visits were found meeting these  conditions.  Showing future appointments within next 90 days and meeting all other requirements   I discussed the assessment and treatment plan with the patient. The patient was provided an opportunity to ask questions and all were answered. The patient agreed with the plan and demonstrated an understanding of the instructions.  Patient advised to call back or seek an in-person evaluation if the symptoms or condition worsens.  Total duration of non-face-to-face encounter: 25 minutes.  Note by: Gillis Santa, MD Date: 05/03/2019; Time: 2:27 PM  Note: This dictation was prepared with Dragon dictation. Any transcriptional errors that may result from this process are unintentional.  Disclaimer:  * Given the special circumstances of the COVID-19 pandemic, the federal government has announced that the Office for Civil Rights (OCR) will exercise its enforcement discretion and will not impose penalties on physicians using telehealth in the event of noncompliance with regulatory requirements under the [place] and [place] (HIPAA) in connection with the good faith provision of telehealth during the XX123456 national public health emergency. ([place])

## 2019-05-31 ENCOUNTER — Other Ambulatory Visit: Payer: Self-pay | Admitting: Student in an Organized Health Care Education/Training Program

## 2019-05-31 DIAGNOSIS — G8929 Other chronic pain: Secondary | ICD-10-CM

## 2019-05-31 DIAGNOSIS — M5416 Radiculopathy, lumbar region: Secondary | ICD-10-CM

## 2019-06-02 ENCOUNTER — Ambulatory Visit: Payer: BC Managed Care – PPO | Admitting: Student in an Organized Health Care Education/Training Program

## 2019-06-03 NOTE — Progress Notes (Signed)
Primary Physician/Referring:  Tracie Harrier, MD  Patient ID: Adam Lucas, male    DOB: [birth year], 68 y.o.   MRN: [id number]  Chief Complaint  Patient presents with  . Coronary Artery Disease  . Hyperlipidemia    HPI: Adam Lucas  is a 68 y.o. male  With coronary artery disease, myocardial infarction and angioplasty to RCA and circumflex in 2016, hrtension, mild hyperlipidemia, depression, degenerative joint disease, chronic back pain, is here on a  78-monthoffice visit and follow-up.  Patient was previously on low-dose pravastatin as he has had intolerance to Crestor and Lipitor due to severe myalgia in the past.  Although his lipids were fairly well controlled, given his CAD history, we had recommended increasing his pravastatin to 40 mg daily. Patient has recently been seen by his PCP in November 2020 after having near syncopal episode.  He was found to be taking extra dose of hydrochlorothiazide.  Hypotension was felt to be From this which was discontinued and he has not had any recurrence.  He is presently feeling well.  Has been scheduled for back surgery in January 2021 for spinal stimulator placement.  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  Past Medical History:  Diagnosis Date  . Chronic lower back pain   . Coronary artery disease   . Degenerative arthritis   . Heart murmur ~ 1958  . High cholesterol   . History of kidney stones   . Hypertension   . Kidney stones   . Migraine    "once q year or 2" (08/09/2014)  . [place]  . Neuropathy    Past Surgical History:  Procedure Laterality Date  . BACK SURGERY     fusion levels l 3,4,5 s1  . CARPAL TUNNEL RELEASE Right 2015  . COLONOSCOPY WITH PROPOFOL N/A 11/02/2015   Procedure: COLONOSCOPY WITH PROPOFOL;  Surgeon: RManya Silvas MD;  [facility];  Service: Endoscopy;  Laterality: N/A;  .  CORONARY ANGIOGRAM  08/09/2014   Procedure: CORONARY ANGIOGRAM;  Surgeon: JLaverda Page MD;  [facility];  Service: Cardiovascular;;  . CORONARY ANGIOPLASTY WITH STENT PLACEMENT  07/17/2014; 08/09/2014   "1; 2"  . EXTRACORPOREAL SHOCK WAVE LITHOTRIPSY  2000's X 1  . FRACTURE SURGERY    . HAND LIGAMENT RECONSTRUCTION Right ~ 2013   "thumb"  . HIP SURGERY Left    10/19/17  . KNEE ARTHROSCOPY Left ~ 2002  . LEFT HEART CATHETERIZATION WITH CORONARY ANGIOGRAM N/A 07/17/2014   Procedure: LEFT HEART CATHETERIZATION WITH CORONARY ANGIOGRAM;  Surgeon: JLaverda Page MD;  [facility];  Service: Cardiovascular;  Laterality: N/A;  . ORIF FINGER / THUMB FRACTURE Right ~ 2012  . PERCUTANEOUS CORONARY STENT INTERVENTION (PCI-S)  08/09/2014   Procedure: PERCUTANEOUS CORONARY STENT INTERVENTION (PCI-S);  Surgeon: JLaverda Page MD;  [facility];  Service: Cardiovascular;;  mid circ promus 4x28 mid LAD promus 2.75x12  . POSTERIOR LUMBAR FUSION 4 LEVEL  2005   "L3, 4, 5; S1"  . TONSILLECTOMY AND ADENOIDECTOMY  1970'S  . TOTAL HIP ARTHROPLASTY Left 10/20/2017   Procedure: TOTAL HIP ARTHROPLASTY;  Surgeon: HDereck Leep MD;  Location: [facility] ORS;  Service: Orthopedics;  Laterality: Left;    Social History   Socioeconomic History  . Marital status: Married    Spouse name: Not on file  . Number of children: 2  .  Years of education: Not on file  . Highest education level: Not on file  Occupational History  . Not on file  Tobacco Use  . Smoking status: Never Smoker  . Smokeless tobacco: Never Used  Substance and Sexual Activity  . Alcohol use: Not Currently    Comment: 08/09/2014 "I'll have a drink 1-2 times year"  . Drug use: No  . Sexual activity: Never  Other Topics Concern  . Not on file  Social History Narrative  . Not on file   Social Determinants of Health   Financial Resource Strain:   . Difficulty of Paying Living Expenses: Not on file  Food  Insecurity:   . Worried About Charity fundraiser in the Last Year: Not on file  . Ran Out of Food in the Last Year: Not on file  Transportation Needs:   . Lack of Transportation (Medical): Not on file  . Lack of Transportation (Non-Medical): Not on file  Physical Activity:   . Days of Exercise per Week: Not on file  . Minutes of Exercise per Session: Not on file  Stress:   . Feeling of Stress : Not on file  Social Connections:   . Frequency of Communication with Friends and Family: Not on file  . Frequency of Social Gatherings with Friends and Family: Not on file  . Attends Religious Services: Not on file  . Active Member of Clubs or Organizations: Not on file  . Attends Archivist Meetings: Not on file  . Marital Status: Not on file  Intimate Partner Violence:   . Fear of Current or Ex-Partner: Not on file  . Emotionally Abused: Not on file  . Physically Abused: Not on file  . Sexually Abused: Not on file   Review of Systems  Constitution: Negative for chills, decreased appetite, malaise/fatigue and weight gain.  Cardiovascular: Positive for claudication (neurogenic). Negative for dyspnea on exertion, leg swelling and syncope.  Endocrine: Negative for cold intolerance.  Hematologic/Lymphatic: Does not bruise/bleed easily.  Musculoskeletal: Positive for back pain (chronic) and joint pain (chronic DJD). Negative for joint swelling.  Gastrointestinal: Negative for abdominal pain, anorexia, change in bowel habit, hematochezia and melena.  Neurological: Negative for headaches and light-headedness.  Psychiatric/Behavioral: Negative for depression and substance abuse.  All other systems reviewed and are negative.  Objective  Blood pressure 135/81, pulse 77, height _0  (1.753 m), weight 203 lb 6.4 oz (92.3 kg), SpO2 98 %. Body mass index is 30.04 kg/m. Vitals with BMI 06/04/2019 04/05/2019 04/05/2019  Height _1  - -  Weight 203 lbs 6 oz - -  BMI 33.29 - -  Systolic  518 841 660  Diastolic 81 86 90  Pulse 77 - -   Physical Exam  Constitutional:   He is well-built and well-nourished, mildly obese in no acute distress.  HENT:  Head: Atraumatic.  Eyes: Conjunctivae are normal.  Neck: No JVD present. No thyromegaly present.  Cardiovascular: Normal rate, regular rhythm, normal heart sounds and intact distal pulses. Exam reveals no gallop.  No murmur heard. Pulmonary/Chest: Effort normal and breath sounds normal.  Abdominal: Soft. Bowel sounds are normal.  Musculoskeletal:        General: Normal range of motion.     Cervical back: Neck supple.  Neurological: He is alert.  Skin: Skin is warm and dry.  Psychiatric: He has a normal mood and affect.   Radiology: No results found.  Laboratory examination:   04/26/2019: Potassium 4.2, creatinine 1.7, EGFR  40, CMP otherwise normal.  CBC normal.  02/04/2019: Cholesterol 161, triglycerides 180, HDL 33.1, LDL 92.  Labs 09/28/2018: Total cholesterol 140, triglycerides 153, HDL 28, LDL 81.  Hb 18.4/HCT 58.9, microcytic indicis, RDW 17.9.  Platelets 234.  Serum glucose 94 mg, BUN 20, creatinine 1.3, EGFR greater than 60 mL, CMP otherwise normal.  TSH normal.  Labs 12/09/2017: Cholesterol 133, triglycerides 141, HDL 28, LDL 77.  Potassium 4.3, creatinine 1.4, EGFR 51, CMP normal.  CBC normal.  CMP Latest Ref Rng & Units 10/08/2017 08/10/2014 07/18/2014  Glucose 65 - 99 mg/dL 101(H) 128(H) 118(H)  BUN 6 - 20 mg/dL 21(H) 14 13  Creatinine 0.61 - 1.24 mg/dL 1.05 1.12 1.13  Sodium 135 - 145 mmol/L 136 138 136  Potassium 3.5 - 5.1 mmol/L 3.7 4.2 3.9  Chloride 101 - 111 mmol/L 104 105 105  CO2 22 - 32 mmol/L _0 Calcium 8.9 - 10.3 mg/dL 9.2 8.8 8.9  Total Protein 6.5 - 8.1 g/dL 7.8 - -  Total Bilirubin 0.3 - 1.2 mg/dL 0.4 - -  Alkaline Phos 38 - 126 U/L 62 - -  AST 15 - 41 U/L 21 - -  ALT 17 - 63 U/L 19 - -   CBC Latest Ref Rng & Units 10/08/2017 08/10/2014 07/18/2014  WBC 3.8 - 10.6 K/uL 13.7(H) 7.4 14.0(H)   Hemoglobin 13.0 - 18.0 g/dL 15.6 12.8(L) 13.0  Hematocrit 40.0 - 52.0 % 48.3 40.7 40.6  Platelets 150 - 440 K/uL 237 222 207   Lipid Panel     Component Value Date/Time   CHOL 171 07/17/2014 1830   TRIG 82 07/17/2014 1830   HDL 38 (L) 07/17/2014 1830   CHOLHDL 4.5 07/17/2014 1830   VLDL 16 07/17/2014 1830   LDLCALC 117 (H) 07/17/2014 1830   HEMOGLOBIN A1C No results found for: HGBA1C, MPG TSH No results for input(s): TSH in the last 8760 hours.  Medications   Current Outpatient Medications  Medication Instructions  . acetaminophen (TYLENOL 8 HOUR ARTHRITIS PAIN) 1,300 mg, Oral, 2 times daily  . aspirin EC 81 mg, Oral, Daily  . carvedilol (COREG) 6.25 mg, Oral, 2 times daily with meals  . cholecalciferol (VITAMIN D) 1000 units tablet Oral, Weekly  . DULoxetine (CYMBALTA) 60 mg, Oral, Daily  . ergocalciferol (VITAMIN D2) 50,000 Units, Oral, Weekly  . fluticasone (FLONASE) 50 MCG/ACT nasal spray 1 spray, Each Nare, Daily PRN  . gabapentin (NEURONTIN) 300 mg, Oral, 3 times daily  . magnesium oxide (MAG-OX) 400 mg, Oral, Daily  . nitroGLYCERIN (NITROSTAT) 0.4 mg, Sublingual, Every 5 min x3 PRN  . olmesartan-hydrochlorothiazide (BENICAR HCT) 40-12.5 MG tablet 1 tablet, Oral, Daily  . pravastatin (PRAVACHOL) 40 mg, Oral, Daily at bedtime  . testosterone cypionate (DEPOTESTOTERONE CYPIONATE) 100 MG/ML injection 1 mL, Intramuscular, Every 14 days  . thiamine (VITAMIN B-1) 100 mg, Oral, Daily  . traMADol (ULTRAM) 50-100 mg, Oral, Every 4 hours PRN  . Turmeric Curcumin 500 mg, Oral, Daily at bedtime  . vitamin B-12 (CYANOCOBALAMIN) 1,000 mcg, Oral, Daily  . vitamin C (ASCORBIC ACID) 500 mg, Oral, Daily    Cardiac Studies:   Coronary angiogram 07/17/2014: Mid RCA with drug-eluting stent placement for MI and staged PCI 08/09/2014: Midcircumflex 4.0 x 28 mm, mid LAD 2.75 x 12 mm Promus Premier DES stent.  Echocardiogram 09/14/2014: Left ventricle cavity is normal in size. Normal  global wall motion. Normal diastolic filling pattern. Calculated EF 60%. The Doppler, spectral and color flow examinations are  unremarkable and show no significant flow abnormalities of the aortic, mitral, tricuspid or pulmonic valves.  Assessment   1. Coronary artery disease involving native coronary artery of native heart without angina pectoris   2. Hypercholesteremia   3. HTN (hypertension), benign    EKG 06/04/2019: Normal sinus rhythm at rate of 81 bpm, left axis deviation, left anterior fascicular block.  Right bundle branch block.  Bifascicular block.  No evidence of ischemia.  Low-voltage complexes. No significant change from 05/19/2019.  Compared to 12/09/2017, right bundle branch block is new.  Recommendations:   HPI: Adam Lucas  is a 68 y.o. male  With coronary artery disease, myocardial infarction and angioplasty to RCA and circumflex in 2016, hrtension, mild hyperlipidemia, depression, degenerative joint disease, chronic back pain, is here on a  50-monthoffice visit and follow-up.  Patient presents here for annual visit and follow-up of coronary artery disease, is presently doing well and essentially asymptomatic except for chronic back pain and sciatica and neurogenic claudication, now scheduled for spinal stimulator in Jan 2021.     His blood pressure has been well controlled. Patient is here on a six-month office visit, there is no clinical evidence of heart failure, he has not had any recurrence of angina pectoris.  His EKG does reveal new onset right bundle branch block.  But as his risk factors are well controlled, he is asymptomatic, I do not think he needs any further evaluation for the same.  Blood pressure is well controlled, I have restarted him back on pravastatin 40 mg in the evening, his backache was not related to the statin but related to spinal stenosis.  I will see him back in one year or sooner if problems.  He'll continue to follow with his PCP for labs including  hypertension management.  However in view of restarting statins, I will obtain lipid profile testing in 4-6 weeks.    JAdrian Prows MD, FFreeman [facility]12/18/2020, 9:26 AM PHydaburgCardiovascular. PCotesfieldPager: [phone] Office: [phone] no answer Cell [phone]

## 2019-06-04 ENCOUNTER — Encounter: Payer: Self-pay | Admitting: Cardiology

## 2019-06-04 ENCOUNTER — Other Ambulatory Visit: Payer: Self-pay

## 2019-06-04 ENCOUNTER — Ambulatory Visit (INDEPENDENT_AMBULATORY_CARE_PROVIDER_SITE_OTHER): Payer: Medicare Other | Admitting: Cardiology

## 2019-06-04 VITALS — BP 135/81 | HR 77 | Ht 69.0 in | Wt 203.4 lb

## 2019-06-04 DIAGNOSIS — E7800 Pure hypercholesterolemia, unspecified: Secondary | ICD-10-CM

## 2019-06-04 DIAGNOSIS — I10 Essential (primary) hypertension: Secondary | ICD-10-CM

## 2019-06-04 DIAGNOSIS — I2510 Atherosclerotic heart disease of native coronary artery without angina pectoris: Secondary | ICD-10-CM

## 2019-06-04 MED ORDER — PRAVASTATIN SODIUM 40 MG PO TABS: 40.0000 mg | ORAL_TABLET | Freq: Every day | ORAL | 3 refills | Status: DC

## 2019-06-04 NOTE — Patient Instructions (Signed)
Please get blood work done in 4 weeks at any [facility].

## 2019-06-08 ENCOUNTER — Ambulatory Visit: Payer: BC Managed Care – PPO | Admitting: Psychology

## 2019-06-09 ENCOUNTER — Encounter: Payer: Self-pay | Admitting: Student in an Organized Health Care Education/Training Program

## 2019-06-09 ENCOUNTER — Ambulatory Visit
Admission: RE | Admit: 2019-06-09 | Discharge: 2019-06-09 | Disposition: A | Discharge status: Home / Self Care | Payer: Medicare Other | Source: Ambulatory Visit | Attending: Student in an Organized Health Care Education/Training Program | Admitting: Student in an Organized Health Care Education/Training Program

## 2019-06-09 ENCOUNTER — Other Ambulatory Visit: Payer: Self-pay

## 2019-06-09 ENCOUNTER — Ambulatory Visit (HOSPITAL_BASED_OUTPATIENT_CLINIC_OR_DEPARTMENT_OTHER): Payer: Medicare Other | Admitting: Student in an Organized Health Care Education/Training Program

## 2019-06-09 VITALS — BP 161/99 | HR 76 | Temp 97.1°F | Resp 16 | Ht 70.0 in | Wt 202.0 lb

## 2019-06-09 DIAGNOSIS — G8929 Other chronic pain: Secondary | ICD-10-CM | POA: Diagnosis not present

## 2019-06-09 DIAGNOSIS — M5416 Radiculopathy, lumbar region: Secondary | ICD-10-CM | POA: Diagnosis not present

## 2019-06-09 MED ORDER — DEXAMETHASONE SODIUM PHOSPHATE 10 MG/ML IJ SOLN
INTRAMUSCULAR | Status: AC
  Filled 2019-06-09: qty 1

## 2019-06-09 MED ORDER — IOHEXOL 180 MG/ML  SOLN
10.0000 mL | Freq: Once | INTRAMUSCULAR | Status: AC
  Administered 2019-06-09: 10 mL via EPIDURAL

## 2019-06-09 MED ORDER — IOHEXOL 180 MG/ML  SOLN
INTRAMUSCULAR | Status: AC
  Filled 2019-06-09: qty 20

## 2019-06-09 MED ORDER — LIDOCAINE HCL 2 % IJ SOLN
INTRAMUSCULAR | Status: AC
  Filled 2019-06-09: qty 20

## 2019-06-09 MED ORDER — SODIUM CHLORIDE 0.9% FLUSH
2.0000 mL | Freq: Once | INTRAVENOUS | Status: AC
  Administered 2019-06-09: 2 mL

## 2019-06-09 MED ORDER — LIDOCAINE HCL 2 % IJ SOLN
20.0000 mL | Freq: Once | INTRAMUSCULAR | Status: AC
  Administered 2019-06-09: 09:00:00 400 mg

## 2019-06-09 MED ORDER — ROPIVACAINE HCL 2 MG/ML IJ SOLN
INTRAMUSCULAR | Status: AC
  Filled 2019-06-09: qty 10

## 2019-06-09 MED ORDER — DEXAMETHASONE SODIUM PHOSPHATE 10 MG/ML IJ SOLN
10.0000 mg | Freq: Once | INTRAMUSCULAR | Status: AC
  Administered 2019-06-09: 09:00:00 10 mg

## 2019-06-09 MED ORDER — SODIUM CHLORIDE (PF) 0.9 % IJ SOLN
INTRAMUSCULAR | Status: AC
  Filled 2019-06-09: qty 10

## 2019-06-09 MED ORDER — ROPIVACAINE HCL 2 MG/ML IJ SOLN
2.0000 mL | Freq: Once | INTRAMUSCULAR | Status: AC
  Administered 2019-06-09: 2 mL via EPIDURAL

## 2019-06-09 NOTE — Progress Notes (Signed)
Patient's Name: Adam Lucas  MRN: [id number]  Referring Provider: Tracie Harrier, MD  DOB: 31-Mar-1951  PCP: Tracie Harrier, MD  DOS: 06/09/2019  Note by: Gillis Santa, MD  Service setting: Ambulatory outpatient  Specialty: Interventional Pain Management  Patient type: Established  Location: [facility] (AMB) Pain Management Facility  Visit type: Interventional Procedure   Primary Reason for Visit: Interventional Pain Management Treatment. CC: back pain and leg pain  Procedure:          Anesthesia, Analgesia, Anxiolysis:  Type: Therapeutic Epidural Steroid Injection #3  #1 done 03/08/2019, #2 done 04/05/2019 Region: Caudal Level: Sacrococcygeal   Laterality: Midline       Type: Local Anesthesia  Local Anesthetic: Lidocaine 1-2%  Position: Prone   Indications: 1. Chronic radicular lumbar pain    Pain Score: Pre-procedure: 7 /10 Post-procedure: 3 /10   Pre-op Assessment:  Adam Lucas is a 68 y.o. (year old), male patient, seen today for interventional treatment. He  has a past surgical history that includes left heart catheterization with coronary angiogram (N/A, 07/17/2014); Coronary angioplasty with stent (07/17/2014; 08/09/2014); Tonsillectomy and adenoidectomy (1970'S); Carpal tunnel release (Right, 2015); Knee arthroscopy (Left, ~ 2002); Posterior lumbar fusion 4 level (2005); ORIF finger / thumb fracture (Right, ~ 2012); Hand ligament reconstruction (Right, ~ 2013); Extracorporeal shock wave lithotripsy (2000's X 1); Fracture surgery; coronary angiogram (08/09/2014); percutaneous coronary stent intervention (pci-s) (08/09/2014); Colonoscopy with propofol (N/A, 11/02/2015); Back surgery; Total hip arthroplasty (Left, 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 Adam Lucas has a current medication list which includes the following prescription(s): acetaminophen, aspirin ec, carvedilol, cholecalciferol, duloxetine, ergocalciferol, fluticasone, gabapentin, magnesium oxide, nitroglycerin,  olmesartan-hydrochlorothiazide, pravastatin, testosterone cypionate, thiamine, tramadol, turmeric curcumin, vitamin b-12, and vitamin c. His primarily concern today is the Back Pain  Initial Vital Signs:  Pulse/HCG Rate: 90  Temp: (!) 97.1 F (36.2 C) Resp: 13 BP: (!) 155/95 SpO2: 100 %  BMI: Estimated body mass index is 28.98 kg/m as calculated from the following:   Height as of this encounter: 5\' 10"  (1.778 m).   Weight as of this encounter: 202 lb (91.6 kg).  Risk Assessment: Allergies: Reviewed. He is allergic to atorvastatin and crestor [rosuvastatin calcium].  Allergy Precautions: None required Coagulopathies: Reviewed. None identified.  Blood-thinner therapy: None at this time Active Infection(s): Reviewed. None identified. Adam Lucas is afebrile  Site Confirmation: Adam Lucas was asked to confirm the procedure and laterality before marking the site Procedure checklist: Completed Consent: Before the procedure and under the influence of no sedative(s), amnesic(s), or anxiolytics, the patient was informed of the treatment options, risks and possible complications. To fulfill our ethical and legal obligations, as recommended by the American Medical Association's Code of Ethics, I have informed the patient of my clinical impression; the nature and purpose of the treatment or procedure; the risks, benefits, and possible complications of the intervention; the alternatives, including doing nothing; the risk(s) and benefit(s) of the alternative treatment(s) or procedure(s); and the risk(s) and benefit(s) of doing nothing. The patient was provided information about the general risks and possible complications associated with the procedure. These may include, but are not limited to: failure to achieve desired goals, infection, bleeding, organ or nerve damage, allergic reactions, paralysis, and death. In addition, the patient was informed of those risks and complications associated to Spine-related  procedures, such as failure to decrease pain; infection (i.e.: Meningitis, epidural or intraspinal abscess); bleeding (i.e.: epidural hematoma, subarachnoid hemorrhage, or any other type of intraspinal or peri-dural bleeding); organ or nerve  damage (i.e.: Any type of peripheral nerve, nerve root, or spinal cord injury) with subsequent damage to sensory, motor, and/or autonomic systems, resulting in permanent pain, numbness, and/or weakness of one or several areas of the body; allergic reactions; (i.e.: anaphylactic reaction); and/or death. Furthermore, the patient was informed of those risks and complications associated with the medications. These include, but are not limited to: allergic reactions (i.e.: anaphylactic or anaphylactoid reaction(s)); adrenal axis suppression; blood sugar elevation that in diabetics may result in ketoacidosis or comma; water retention that in patients with history of congestive heart failure may result in shortness of breath, pulmonary edema, and decompensation with resultant heart failure; weight gain; swelling or edema; medication-induced neural toxicity; particulate matter embolism and blood vessel occlusion with resultant organ, and/or nervous system infarction; and/or aseptic necrosis of one or more joints. Finally, the patient was informed that Medicine is not an exact science; therefore, there is also the possibility of unforeseen or unpredictable risks and/or possible complications that may result in a catastrophic outcome. The patient indicated having understood very clearly. We have given the patient no guarantees and we have made no promises. Enough time was given to the patient to ask questions, all of which were answered to the patient's satisfaction. Adam Lucas has indicated that he wanted to continue with the procedure. Attestation: I, the ordering provider, attest that I have discussed with the patient the benefits, risks, side-effects, alternatives, likelihood of  achieving goals, and potential problems during recovery for the procedure that I have provided informed consent. Date  Time:   Pre-Procedure Preparation:  Monitoring: As per clinic protocol. Respiration, ETCO2, SpO2, BP, heart rate and rhythm monitor placed and checked for adequate function Safety Precautions: Patient was assessed for positional comfort and pressure points before starting the procedure. Time-out: I initiated and conducted the "Time-out" before starting the procedure, as per protocol. The patient was asked to participate by confirming the accuracy of the "Time Out" information. Verification of the correct person, site, and procedure were performed and confirmed by me, the nursing staff, and the patient. "Time-out" conducted as per Joint Commission's Universal Protocol (UP.01.01.01). Time: 0904  Description of Procedure:          Target Area: Caudal Epidural Canal. Approach: Midline approach. Area Prepped: Entire Posterior Sacrococcygeal Region Prepping solution: DuraPrep (Iodine Povacrylex [0.7% available iodine] and Isopropyl Alcohol, 74% w/w) Safety Precautions: Aspiration looking for blood return was conducted prior to all injections. At no point did we inject any substances, as a needle was being advanced. No attempts were made at seeking any paresthesias. Safe injection practices and needle disposal techniques used. Medications properly checked for expiration dates. SDV (single dose vial) medications used. Description of the Procedure: Protocol guidelines were followed. The patient was placed in position over the fluoroscopy table. The target area was identified and the area prepped in the usual manner. Skin & deeper tissues infiltrated with local anesthetic. Appropriate amount of time allowed to pass for local anesthetics to take effect. The procedure needles were then advanced to the target area. Proper needle placement secured. Negative aspiration confirmed. Solution injected  in intermittent fashion, asking for systemic symptoms every 0.5cc of injectate. The needles were then removed and the area cleansed, making sure to leave some of the prepping solution back to take advantage of its long term bactericidal properties. Vitals:   06/09/19 0826 06/09/19 0900 06/09/19 0910 06/09/19 0919  BP: (!) 155/95 (!) 153/95 (!) 157/100 (!) 161/99  Pulse: 90 78 77 76  Resp:  13 18 16   Temp: (!) 97.1 F (36.2 C)     SpO2: 100% 99% 100% 100%  Weight: 202 lb (91.6 kg)     Height: 5\' 10"  (1.778 m)       Start Time: 0904 hrs. End Time: 0917 hrs. Materials:  Needle(s) Type: Epidural needle Gauge: 22G Length: 3.5-in Medication(s): Please see orders for medications and dosing details. 9 cc solution made of 5 cc of preservative-free saline, 3 cc of 0.2% ropivacaine, 1 cc of Decadron 10 mg/cc. Imaging Guidance (Spinal):          Type of Imaging Technique: Fluoroscopy Guidance (Spinal) Indication(s): Assistance in needle guidance and placement for procedures requiring needle placement in or near specific anatomical locations not easily accessible without such assistance. Exposure Time: Please see nurses notes. Contrast: Before injecting any contrast, we confirmed that the patient did not have an allergy to iodine, shellfish, or radiological contrast. Once satisfactory needle placement was completed at the desired level, radiological contrast was injected. Contrast injected under live fluoroscopy. No contrast complications. See chart for type and volume of contrast used. Fluoroscopic Guidance: I was personally present during the use of fluoroscopy. "Tunnel Vision Technique" used to obtain the best possible view of the target area. Parallax error corrected before commencing the procedure. "Direction-depth-direction" technique used to introduce the needle under continuous pulsed fluoroscopy. Once target was reached, antero-posterior, oblique, and lateral fluoroscopic projection used confirm  needle placement in all planes. Images permanently stored in EMR. Interpretation: I personally interpreted the imaging intraoperatively. Adequate needle placement confirmed in multiple planes. Appropriate spread of contrast into desired area was observed. No evidence of afferent or efferent intravascular uptake. No intrathecal or subarachnoid spread observed. Permanent images saved into the patient's record.  Antibiotic Prophylaxis:   Anti-infectives (From admission, onward)   None     Indication(s): None identified  Post-operative Assessment:  Post-procedure Vital Signs:  Pulse/HCG Rate: 76  Temp: (!) 97.1 F (36.2 C) Resp: 16 BP: (!) 161/99 SpO2: 100 %  EBL: None  Complications: No immediate post-treatment complications observed by team, or reported by patient.  Note: The patient tolerated the entire procedure well. A repeat set of vitals were taken after the procedure and the patient was kept under observation following institutional policy, for this type of procedure. Post-procedural neurological assessment was performed, showing return to baseline, prior to discharge. The patient was provided with post-procedure discharge instructions, including a section on how to identify potential problems. Should any problems arise concerning this procedure, the patient was given instructions to immediately contact us, at any time, without hesitation. In any case, we plan to contact the patient by telephone for a follow-up status report regarding this interventional procedure.  Comments:  No additional relevant information. 5 out of 5 strength bilateral lower extremity: Plantar flexion, dorsiflexion, knee flexion, knee extension.  Plan of Care  Orders:  Orders Placed This Encounter  Procedures  . Fluoro (C-Arm) (<60 min) (No Report)    Intraoperative interpretation by procedural physician at [facility].    Standing Status:   Standing    Number of Occurrences:   1    Order  Specific Question:   Reason for exam:    Answer:   Assistance in needle guidance and placement for procedures requiring needle placement in or near specific anatomical locations not easily accessible without such assistance.   Medications ordered for procedure: Meds ordered this encounter  Medications  . iohexol (OMNIPAQUE) 180 MG/ML injection 10 mL  Must be Myelogram-compatible. If not available, you may substitute with a water-soluble, non-ionic, hypoallergenic, myelogram-compatible radiological contrast medium.  Marland Kitchen lidocaine (XYLOCAINE) 2 % (with pres) injection 400 mg  . ropivacaine (PF) 2 mg/mL (0.2%) (NAROPIN) injection 2 mL  . sodium chloride flush (NS) 0.9 % injection 2 mL  . dexamethasone (DECADRON) injection 10 mg   Medications administered: We administered iohexol, lidocaine, ropivacaine (PF) 2 mg/mL (0.2%), sodium chloride flush, and dexamethasone.  See the medical record for exact dosing, route, and time of administration.  Follow-up plan:   Return if symptoms worsen or fail to improve.     Recent Visits Date Type Provider Dept  05/03/19 Office Visit Gillis Santa, MD [facility]  04/05/19 Procedure visit Gillis Santa, MD [facility]  03/22/19 Office Visit Gillis Santa, MD [facility]  Showing recent visits within past 90 days and meeting all other requirements   Today's Visits Date Type Provider Dept  06/09/19 Procedure visit Gillis Santa, MD [facility]  Showing today's visits and meeting all other requirements   Future Appointments No visits were found meeting these conditions.  Showing future appointments within next 90 days and meeting all other requirements   Disposition: Discharge home  Discharge Date & Time: 06/09/2019; 0921 hrs.   Primary Care Physician: Tracie Harrier, MD Location: [facility] Outpatient [facility] Note by: Gillis Santa, MD Date: 06/09/2019; Time: 3:48 PM  Disclaimer:  Medicine  is not an exact science. The only guarantee in medicine is that nothing is guaranteed. It is important to note that the decision to proceed with this intervention was based on the information collected from the patient. The Data and conclusions were drawn from the patient's questionnaire, the interview, and the physical examination. Because the information was provided in large part by the patient, it cannot be guaranteed that it has not been purposely or unconsciously manipulated. Every effort has been made to obtain as much relevant data as possible for this evaluation. It is important to note that the conclusions that lead to this procedure are derived in large part from the available data. Always take into account that the treatment will also be dependent on availability of resources and existing treatment guidelines, considered by other Pain Management Practitioners as being common knowledge and practice, at the time of the intervention. For Medico-Legal purposes, it is also important to point out that variation in procedural techniques and pharmacological choices are the acceptable norm. The indications, contraindications, technique, and results of the above procedure should only be interpreted and judged by a Board-Certified Interventional Pain Specialist with extensive familiarity and expertise in the same exact procedure and technique.

## 2019-06-15 NOTE — Discharge Instructions (Signed)
?  Instructions after Hand / Wrist Surgery ? ? Emryn Flanery P. Tashai Catino, Jr., M.D. ? Dept. of Orthopaedics & Sports Medicine ? [facility] ? [address] ? [place], [place]  [address] ? ? Phone: [phone]   Fax: [phone] ? ? ?DIET: ?Drink plenty of non-alcoholic fluids & begin a light diet. ?Resume your normal diet the day after surgery. ? ?ACTIVITY:  ?Keep the hand elevated above the level of the elbow. ?Begin gently moving the fingers on a regular basis to avoid stiffness. ?Avoid any heavy lifting, pushing, or pulling with the operative hand. ?Do not drive or operate any equipment until instructed. ? ?WOUND CARE:  ?Keep the splint/bandage clean and dry.  ?The splint and stitches will be removed in the office. ?Continue to use the ice packs periodically to reduce pain and swelling. ?You may bathe or shower after the stitches are removed at the first office visit following surgery. ? ?MEDICATIONS: ?You may resume your regular medications. ?Please take the pain medication as prescribed. ?Do not take pain medication on an empty stomach. ?Do not drive or drink alcoholic beverages when taking pain medications. ? ?CALL THE OFFICE FOR: ?Temperature above 101 degrees ?Excessive bleeding or drainage on the dressing. ?Excessive swelling, coldness, or paleness of the fingers. ?Persistent nausea and vomiting. ? ?FOLLOW-UP:  ?You should have an appointment to return to the office in 7-10 days after surgery.  ? ?REMEMBER: R.I.C.E. = Rest, Ice, Compression, Elevation !  ? ? ? ?[facility] ?Department Directory ?     ? ? ? ?[email or web address] ? ? ?  ? ? ?[email or web address] ?  ? ?      ?Cardiology ? ?Appointments: ?[place] - [phone] ?[place] - [phone]  Endocrinology ? ?Appointments: ?[place] - [phone] ?[place] - [phone]  Gastroenterology ? ?Appointments: ?[place] - [phone] ?[place] - [phone]  ?      ?General Surgery ? ? ?Appointments: ?[place] -  [phone]  Internal Medicine/Family Medicine ? ?Appointments: ?[place] - [phone] ?[place] - [phone] ?[place] - [phone]  Metabolic and Weigh Loss Surgery ? ?Appointments: ?[place] - [phone]  ?      ?Neurology ? ?Appointments: ?[place] - [phone] ?[place] - [phone]  Neurosurgery ? ?Appointments: ?[place] - [phone]  Obstetrics & Gynecology ? ?Appointments: ?[place] - [phone] ?[place] - [phone]  ?      ?Pediatrics ? ?Appointments: ?[place] - [phone] ?[place] - [phone]  Physiatry ? ?Appointments: ?[place] -[phone]  Physical Therapy ? ?Appointments: ?[place] - [phone] ?[place] - [phone]  ?      ?Podiatry ? ?Appointments: ?[place] - [phone] ?[place] - [phone]  Pulmonology ? ?Appointments: ?[place] - [phone]  Rheumatology ? ?Appointments: ?[place] - [phone]  ?      ?[place] Location: ?[facility]  ?[address] ?[place], [place]  [address]  [place] Location: ?[facility] ?[address] ?[place], [place]  [address]  [place] Location: ?[facility] ?[address] ?[place], [place]  [address]  ?  ?

## 2019-06-16 ENCOUNTER — Encounter
Admission: RE | Admit: 2019-06-16 | Discharge: 2019-06-16 | Disposition: A | Discharge status: Home / Self Care | Payer: Medicare Other | Source: Ambulatory Visit | Attending: Orthopedic Surgery | Admitting: Orthopedic Surgery

## 2019-06-16 ENCOUNTER — Other Ambulatory Visit: Payer: Self-pay

## 2019-06-16 NOTE — Pre-Procedure Instructions (Signed)
Adam Prows, MD  Physician  Cardiology     Progress Notes      Signed     Encounter Date:  06/04/2019                  Signed          Expand AllCollapse All            Expand widget buttonCollapse widget button    Show:Clear all   ManualTemplateCopied  Added by:     Adam Prows, MD  Miquel Dunn, NP   Hover for detailscustomization button                                                                                                                                                                                                                         untitled image        Primary Physician/Referring:  Tracie Harrier, MD     Patient ID: Adam Lucas, male    DOB: [birth year], 68 y.o.   MRN: [id number]         Chief Complaint    Patient presents with    .   Coronary Artery Disease    .   Hyperlipidemia          HPI: Adam Lucas  is a 68 y.o. male  With coronary artery disease, myocardial infarction and angioplasty to RCA and circumflex in 2016, hrtension, mild hyperlipidemia, depression, degenerative joint disease, chronic back pain, is here on a  81-monthoffice visit and follow-up.     Patient was previously on low-dose pravastatin as he has had intolerance to Crestor and Lipitor due to severe myalgia in the past.  Although his lipids were fairly well controlled, given his CAD history, we had recommended increasing his pravastatin to 40 mg daily. Patient has recently been seen by his PCP in November 2020 after having near syncopal episode.  He was found to be taking extra dose of hydrochlorothiazide.  Hypotension was felt to be From this which was discontinued and he has not had any recurrence.   He is presently feeling well.  Has been scheduled for back surgery in January 2021 for spinal stimulator placement.     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          Past Medical History:    Diagnosis   Date    .  Chronic lower back pain        .   Coronary artery disease        .   Degenerative arthritis        .   Heart murmur   ~ 1958    .   High cholesterol        .   History of kidney stones        .   Hypertension        .   Kidney stones        .   Migraine            "once q year or 2" (08/09/2014)    .   Myocardial infarction ([place])   07/17/2014    .   Neuropathy                 Past Surgical History:    Procedure   Laterality   Date    .   BACK SURGERY                fusion levels l 3,4,5 s1    .   CARPAL TUNNEL RELEASE   Right   2015    .   COLONOSCOPY WITH PROPOFOL   N/A   11/02/2015        Procedure: COLONOSCOPY WITH PROPOFOL;  Surgeon: Manya Silvas, MD;  Location: [facility] ENDOSCOPY;  Service: Endoscopy;  Laterality: N/A;    .   CORONARY ANGIOGRAM       08/09/2014        Procedure: CORONARY ANGIOGRAM;  Surgeon: [name], MD;  Location: [facility] CATH LAB;  Service: Cardiovascular;;    .   CORONARY ANGIOPLASTY WITH STENT PLACEMENT       07/17/2014; 08/09/2014        "1; 2"    .   EXTRACORPOREAL SHOCK WAVE LITHOTRIPSY       2000's X 1    .   FRACTURE SURGERY            .   HAND LIGAMENT RECONSTRUCTION   Right   ~ 2013        "thumb"    .   HIP SURGERY   Left            10/19/17    .   KNEE ARTHROSCOPY   Left   ~ 2002    .   LEFT HEART CATHETERIZATION WITH CORONARY ANGIOGRAM   N/A   07/17/2014        Procedure: LEFT HEART CATHETERIZATION WITH CORONARY ANGIOGRAM;  Surgeon: [name],  MD;  Location: [facility] CATH LAB;  Service: Cardiovascular;  Laterality: N/A;    .   ORIF FINGER / THUMB FRACTURE   Right   ~ 2012    .   PERCUTANEOUS CORONARY STENT INTERVENTION (PCI-S)       08/09/2014        Procedure: PERCUTANEOUS CORONARY STENT INTERVENTION (PCI-S);  Surgeon: [name], MD;  Location: [facility] CATH LAB;  Service: Cardiovascular;;  mid circ promus 4x28  mid LAD promus 2.75x12    .   POSTERIOR LUMBAR FUSION 4 LEVEL       2005        "L3, 4, 5; S1"    .   TONSILLECTOMY AND ADENOIDECTOMY       1970'S    .   TOTAL HIP ARTHROPLASTY   Left   10/20/2017  Procedure: TOTAL HIP ARTHROPLASTY;  Surgeon: Dereck Leep, MD;  Location: [facility] ORS;  Service: Orthopedics;  Laterality: Left;           Social History             Socioeconomic History    .   Marital status:   Married            Spouse name:   Not on file    .   Number of children:   2    .   Years of education:   Not on file    .   Highest education level:   Not on file    Occupational History    .   Not on file    Tobacco Use    .   Smoking status:   Never Smoker    .   Smokeless tobacco:   Never Used    Substance and Sexual Activity    .   Alcohol use:   Not Currently            Comment: 08/09/2014 "I'll have a drink 1-2 times year"    .   Drug use:   No    .   Sexual activity:   Never    Other Topics   Concern    .   Not on file    Social History Narrative    .   Not on file        Social Determinants of Health           Financial Resource Strain:     .   Difficulty of Paying Living Expenses: Not on file    Food Insecurity:     .   Worried About Charity fundraiser in the Last Year: Not on file    .   Ran Out of Food in the Last Year: Not on file    Transportation Needs:     .   Lack of Transportation (Medical): Not on file     .   Lack of Transportation (Non-Medical): Not on file    Physical Activity:     .   Days of Exercise per Week: Not on file    .   Minutes of Exercise per Session: Not on file    Stress:     .   Feeling of Stress : Not on file    Social Connections:     .   Frequency of Communication with Friends and Family: Not on file    .   Frequency of Social Gatherings with Friends and Family: Not on file    .   Attends Religious Services: Not on file    .   Active Member of Clubs or Organizations: Not on file    .   Attends Archivist Meetings: Not on file    .   Marital Status: Not on file    Intimate Partner Violence:     .   Fear of Current or Ex-Partner: Not on file    .   Emotionally Abused: Not on file    .   Physically Abused: Not on file    .   Sexually Abused: Not on file       Review of Systems   Constitution: Negative for chills, decreased appetite, malaise/fatigue and weight gain.   Cardiovascular: Positive for claudication (neurogenic). Negative for dyspnea on exertion, leg swelling and syncope.  Endocrine: Negative for cold intolerance.   Hematologic/Lymphatic: Does not bruise/bleed easily.   Musculoskeletal: Positive for back pain (chronic) and joint pain (chronic DJD). Negative for joint swelling.   Gastrointestinal: Negative for abdominal pain, anorexia, change in bowel habit, hematochezia and melena.   Neurological: Negative for headaches and light-headedness.   Psychiatric/Behavioral: Negative for depression and substance abuse.   All other systems reviewed and are negative.       Objective    Blood pressure 135/81, pulse 77, height 5' 9"  (1.753 m), weight 203 lb 6.4 oz (92.3 kg), SpO2 98 %. Body mass index is 30.04 kg/m.    Vitals with BMI   06/04/2019   04/05/2019   04/05/2019    Height   5' 9"    -   -    Weight   203 lbs 6 oz   -   -    BMI    30.02   -   -    Systolic   671   245   809    Diastolic   81   86   90    Pulse   77   -   -       Physical Exam   Constitutional:   He is well-built and well-nourished, mildly obese in no acute distress.   HENT:   Head: Atraumatic.   Eyes: Conjunctivae are normal.   Neck: No JVD present. No thyromegaly present.   Cardiovascular: Normal rate, regular rhythm, normal heart sounds and intact distal pulses. Exam reveals no gallop.   No murmur heard.  Pulmonary/Chest: Effort normal and breath sounds normal.   Abdominal: Soft. Bowel sounds are normal.   Musculoskeletal:         General: Normal range of motion.     Cervical back: Neck supple.  Neurological: He is alert.   Skin: Skin is warm and dry.  Psychiatric: He has a normal mood and affect.      Radiology:   Imaging Results (Last 48 hours)            Laboratory examination:       04/26/2019: Potassium 4.2, creatinine 1.7, EGFR 40, CMP otherwise normal.  CBC normal.     02/04/2019: Cholesterol 161, triglycerides 180, HDL 33.1, LDL 92.     Labs 09/28/2018: Total cholesterol 140, triglycerides 153, HDL 28, LDL 81.  Hb 18.4/HCT 58.9, microcytic indicis, RDW 17.9.  Platelets 234.  Serum glucose 94 mg, BUN 20, creatinine 1.3, EGFR greater than 60 mL, CMP otherwise normal.  TSH normal.     Labs 12/09/2017: Cholesterol 133, triglycerides 141, HDL 28, LDL 77.  Potassium 4.3, creatinine 1.4, EGFR 51, CMP normal.  CBC normal.      CMP   Latest Ref Rng & Units   10/08/2017   08/10/2014   07/18/2014    Glucose   65 - 99 mg/dL   101(H)   128(H)   118(H)    BUN   6 - 20 mg/dL   21(H)   14   13    Creatinine   0.61 - 1.24 mg/dL   1.05   1.12   1.13    Sodium   135 - 145 mmol/L   136   138   136    Potassium   3.5 - 5.1 mmol/L   3.7   4.2   3.9    Chloride   101 - 111 mmol/L   104   105  105    CO2   22 - 32  mmol/L   25   29   25     Calcium   8.9 - 10.3 mg/dL   9.2   8.8   8.9    Total Protein   6.5 - 8.1 g/dL   7.8   -   -    Total Bilirubin   0.3 - 1.2 mg/dL   0.4   -   -    Alkaline Phos   38 - 126 U/L   62   -   -    AST   15 - 41 U/L   21   -   -    ALT   17 - 63 U/L   19   -   -        CBC   Latest Ref Rng & Units   10/08/2017   08/10/2014   07/18/2014    WBC   3.8 - 10.6 K/uL   13.7(H)   7.4   14.0(H)    Hemoglobin   13.0 - 18.0 g/dL   15.6   12.8(L)   13.0    Hematocrit   40.0 - 52.0 %   48.3   40.7   40.6    Platelets   150 - 440 K/uL   237   222   207       Lipid Panel   Labs (Brief)                                                                                                           HEMOGLOBIN A1C   Recent Labs       TSH   Recent Labs (within last 365 days)            Medications            Current Outpatient Medications    Medication   Instructions    .   acetaminophen (TYLENOL 8 HOUR ARTHRITIS PAIN)   1,300 mg, Oral, 2 times daily    .   aspirin EC   81 mg, Oral, Daily    .   carvedilol (COREG)   6.25 mg, Oral, 2 times daily with meals    .   cholecalciferol (VITAMIN D) 1000 units tablet   Oral, Weekly    .   DULoxetine (CYMBALTA)   60 mg, Oral, Daily    .   ergocalciferol (VITAMIN D2)   50,000 Units, Oral, Weekly    .   fluticasone (FLONASE) 50 MCG/ACT nasal spray   1 spray, Each Nare, Daily PRN    .   gabapentin (NEURONTIN)   300 mg, Oral, 3 times daily    .   magnesium oxide (MAG-OX)   400 mg, Oral, Daily    .   nitroGLYCERIN (NITROSTAT)   0.4 mg, Sublingual, Every 5 min x3 PRN    .   olmesartan-hydrochlorothiazide (BENICAR HCT) 40-12.5 MG tablet   1 tablet, Oral, Daily    .  pravastatin (PRAVACHOL)   40 mg, Oral, Daily at bedtime    .   testosterone cypionate (DEPOTESTOTERONE CYPIONATE) 100 MG/ML injection   1 mL, Intramuscular, Every 14 days    .   thiamine (VITAMIN B-1)   100 mg, Oral, Daily    .   traMADol (ULTRAM)   50-100 mg, Oral, Every 4 hours PRN    .   Turmeric Curcumin   500 mg, Oral, Daily at bedtime    .   vitamin B-12 (CYANOCOBALAMIN)   1,000 mcg, Oral, Daily    .   vitamin C (ASCORBIC ACID)   500 mg, Oral, Daily            Cardiac Studies:       Coronary angiogram 07/17/2014: Mid RCA with drug-eluting stent placement for MI and staged PCI 08/09/2014: Midcircumflex 4.0 x 28 mm, mid LAD 2.75 x 12 mm Promus Premier DES stent.     Echocardiogram 09/14/2014: Left ventricle cavity is normal in size. Normal global wall motion. Normal diastolic filling pattern. Calculated EF 60%. The Doppler, spectral and color flow examinations are unremarkable and show no significant flow abnormalities of the aortic, mitral, tricuspid or pulmonic valves.       Assessment        1.   Coronary artery disease involving native coronary artery of native heart without angina pectoris     2.   Hypercholesteremia     3.   HTN (hypertension), benign        EKG 06/04/2019: Normal sinus rhythm at rate of 81 bpm, left axis deviation, left anterior fascicular block.  Right bundle branch block.  Bifascicular block.  No evidence of ischemia.  Low-voltage complexes. No significant change from 05/19/2019.  Compared to 12/09/2017, right bundle branch block is new.       Recommendations:       HPI: THORNE WIRZ  is a 68 y.o. male  With coronary artery disease, myocardial infarction and angioplasty to RCA and circumflex in 2016, hrtension, mild hyperlipidemia, depression, degenerative joint disease, chronic back pain, is here on a  79-monthoffice visit and follow-up.     Patient presents here for annual visit  and follow-up of coronary artery disease, is presently doing well and essentially asymptomatic except for chronic back pain and sciatica and neurogenic claudication, now scheduled for spinal stimulator in Jan 2021.        His blood pressure has been well controlled. Patient is here on a six-month office visit, there is no clinical evidence of heart failure, he has not had any recurrence of angina pectoris.  His EKG does reveal new onset right bundle branch block.  But as his risk factors are well controlled, he is asymptomatic, I do not think he needs any further evaluation for the same.     Blood pressure is well controlled, I have restarted him back on pravastatin 40 mg in the evening, his backache was not related to the statin but related to spinal stenosis.  I will see him back in one year or sooner if problems.  He'll continue to follow with his PCP for labs including hypertension management.  However in view of restarting statins, I will obtain lipid profile testing in 4-6 weeks.       JAdrian Prows MD, [facility] 06/04/2019, 9:26 AM  [email or web address] [facility]  Pager: [phone]  Office: [phone] If no answer Cell [phone]         Electronically signed  by Adam Prows, MD at 06/04/2019  9:29 AM             Office Visit on 06/04/2019               Revision History               Detailed Report             Note shared with patient

## 2019-06-17 ENCOUNTER — Encounter
Admission: RE | Admit: 2019-06-17 | Discharge: 2019-06-17 | Disposition: A | Discharge status: Home / Self Care | Payer: Medicare Other | Source: Ambulatory Visit | Attending: Orthopedic Surgery | Admitting: Orthopedic Surgery

## 2019-06-17 HISTORY — DX: Other seasonal allergic rhinitis: J30.2

## 2019-06-17 HISTORY — DX: Carpal tunnel syndrome, unspecified upper limb: G56.00

## 2019-06-17 HISTORY — DX: Testicular hypofunction: E29.1

## 2019-06-17 NOTE — Patient Instructions (Addendum)
Your procedure is scheduled on: Wed 1/6 Report to Day Surgery. To find out your arrival time please call [phone] between 1PM - 3PM on Tues 1/5.  Remember: Instructions that are not followed completely may result in serious medical risk,  up to and including death, or upon the discretion of your surgeon and anesthesiologist your  surgery may need to be rescheduled.     _X__ 1. Do not eat food after midnight the night before your procedure.                 No gum chewing or hard candies. You may drink clear liquids up to 2 hours                 before you are scheduled to arrive for your surgery- DO not drink clear                 liquids within 2 hours of the start of your surgery.                 Clear Liquids include:  water, apple juice without pulp, clear carbohydrate                 drink such as Clearfast of Gatorade, Black Coffee or Tea (Do not add                 anything to coffee or tea).  __X__2.  On the morning of surgery brush your teeth with toothpaste and water, you                may rinse your mouth with mouthwash if you wish.  Do not swallow any toothpaste of mouthwash.     _X__ 3.  No Alcohol for 24 hours before or after surgery.   ___ 4.  Do Not Smoke or use e-cigarettes For 24 Hours Prior to Your Surgery.                 Do not use any chewable tobacco products for at least 6 hours prior to                 surgery.  ____  5.  Bring all medications with you on the day of surgery if instructed.   __x__  6.  Notify your doctor if there is any change in your medical condition      (cold, fever, infections).     Do not wear jewelry, make-up, hairpins, clips or nail polish. Do not wear lotions, powders, or perfumes. You may wear deodorant. Do not shave 48 hours prior to surgery. Men may shave face and neck. Do not bring valuables to the hospital.    [facility] is not responsible for any belongings or valuables.  Contacts, dentures or  bridgework may not be worn into surgery. Leave your suitcase in the car. After surgery it may be brought to your room. For patients admitted to the hospital, discharge time is determined by your treatment team.   Patients discharged the day of surgery will not be allowed to drive home.   Please read over the following fact sheets that you were given:     _x___ Take these medicines the morning of surgery with A SIP OF WATER:    1.corvedilol  2. Duloxetine  3. gabapentin  4.tramadol  5.  6.  ____ Fleet Enema (as directed)   __x__ Use CHG Soap as directed  ____ Use inhalers on the day of  surgery  ____ Stop metformin 2 days prior to surgery    ____ Take 1/2 of usual insulin dose the night before surgery. No insulin the morning          of surgery.   ____ Stop Coumadin/Plavix/aspirin    ____ Stop Anti-inflammatories    __x__ Stop supplements until after surgery.  Tumeric and Vit C  ____ Bring C-Pap to the hospital.

## 2019-06-21 ENCOUNTER — Other Ambulatory Visit
Admission: RE | Admit: 2019-06-21 | Discharge: 2019-06-21 | Disposition: A | Discharge status: Home / Self Care | Payer: Medicare Other | Source: Ambulatory Visit | Attending: Orthopedic Surgery | Admitting: Orthopedic Surgery

## 2019-06-21 DIAGNOSIS — Z01812 Encounter for preprocedural laboratory examination: Secondary | ICD-10-CM | POA: Diagnosis present

## 2019-06-21 DIAGNOSIS — Z20822 Contact with and (suspected) exposure to covid-19: Secondary | ICD-10-CM | POA: Diagnosis not present

## 2019-06-21 LAB — SARS CORONAVIRUS 2 (TAT 6-24 HRS): SARS Coronavirus 2: POSITIVE — AB

## 2019-06-22 ENCOUNTER — Telehealth: Payer: Self-pay | Admitting: Nurse Practitioner

## 2019-06-22 ENCOUNTER — Other Ambulatory Visit: Payer: Self-pay

## 2019-06-22 ENCOUNTER — Other Ambulatory Visit
Admission: RE | Admit: 2019-06-22 | Discharge: 2019-06-22 | Disposition: A | Discharge status: Home / Self Care | Payer: Medicare Other | Source: Ambulatory Visit | Attending: Orthopedic Surgery | Admitting: Orthopedic Surgery

## 2019-06-22 DIAGNOSIS — Z01812 Encounter for preprocedural laboratory examination: Secondary | ICD-10-CM | POA: Insufficient documentation

## 2019-06-22 DIAGNOSIS — Z20822 Contact with and (suspected) exposure to covid-19: Secondary | ICD-10-CM | POA: Diagnosis not present

## 2019-06-22 NOTE — Telephone Encounter (Signed)
Called to Discuss with patient about Covid symptoms and the use of bamlanivimab, a monoclonal antibody infusion for those with mild to moderate Covid symptoms and at a high risk of hospitalization.     Pt is qualified for this infusion at the [facility] infusion center due to co-morbid conditions and/or a member of an at-risk group.     Patient Active Problem List   Diagnosis Date Noted  . Polyneuropathy 02/09/2019  . Lumbar post-laminectomy syndrome 02/09/2019  . History of lumbar fusion (L3-S1) 02/09/2019  . Chronic radicular lumbar pain 02/09/2019  . Chronic pain syndrome 02/09/2019  . Bilateral leg weakness 12/23/2018  . Status post total replacement of left hip 12/07/2017  . Carpal tunnel syndrome 10/20/2017  . Status post total replacement of hip 10/20/2017  . CAD (coronary artery disease) 08/09/2014  . 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 CAD (coronary artery disease), native coronary artery 07/17/2014  . Acute MI, inferior wall, initial episode of care ([place]) 07/17/2014  . Hyperglycemia, unspecified 12/31/2013  . Chronic back pain 12/18/2013  . ED (erectile dysfunction) 12/18/2013  . HTN (hypertension), benign 12/18/2013    Patient declines infusion at this time. Patient states that he is not having any symptoms. Symptoms tier reviewed as well as criteria for ending isolation. Preventative practices reviewed. Patient verbalized understanding.    Patient advised to call back if he decides that he does want to get infusion. Callback number to the infusion center given. Patient advised to go to Urgent care or ED with severe symptoms.

## 2019-06-23 ENCOUNTER — Other Ambulatory Visit: Payer: Self-pay

## 2019-06-23 ENCOUNTER — Ambulatory Visit
Admission: RE | Admit: 2019-06-23 | Discharge: 2019-06-23 | Disposition: A | Discharge status: Home / Self Care | Payer: Medicare Other | Attending: Orthopedic Surgery | Admitting: Orthopedic Surgery

## 2019-06-23 ENCOUNTER — Ambulatory Visit: Payer: Medicare Other | Admitting: Anesthesiology

## 2019-06-23 ENCOUNTER — Encounter
Admission: RE | Disposition: A | Discharge status: Home / Self Care | Payer: Self-pay | Source: Home / Self Care | Attending: Orthopedic Surgery

## 2019-06-23 ENCOUNTER — Encounter: Payer: Self-pay | Admitting: Orthopedic Surgery

## 2019-06-23 DIAGNOSIS — Z8249 Family history of ischemic heart disease and other diseases of the circulatory system: Secondary | ICD-10-CM | POA: Insufficient documentation

## 2019-06-23 DIAGNOSIS — Z7982 Long term (current) use of aspirin: Secondary | ICD-10-CM | POA: Diagnosis not present

## 2019-06-23 DIAGNOSIS — N189 Chronic kidney disease, unspecified: Secondary | ICD-10-CM | POA: Diagnosis not present

## 2019-06-23 DIAGNOSIS — Z981 Arthrodesis status: Secondary | ICD-10-CM | POA: Insufficient documentation

## 2019-06-23 DIAGNOSIS — I252 Old myocardial infarction: Secondary | ICD-10-CM | POA: Diagnosis not present

## 2019-06-23 DIAGNOSIS — M1990 Unspecified osteoarthritis, unspecified site: Secondary | ICD-10-CM | POA: Diagnosis not present

## 2019-06-23 DIAGNOSIS — Z955 Presence of coronary angioplasty implant and graft: Secondary | ICD-10-CM | POA: Insufficient documentation

## 2019-06-23 DIAGNOSIS — Z9889 Other specified postprocedural states: Secondary | ICD-10-CM

## 2019-06-23 DIAGNOSIS — G5602 Carpal tunnel syndrome, left upper limb: Secondary | ICD-10-CM | POA: Diagnosis present

## 2019-06-23 DIAGNOSIS — Z833 Family history of diabetes mellitus: Secondary | ICD-10-CM | POA: Diagnosis not present

## 2019-06-23 DIAGNOSIS — Z79899 Other long term (current) drug therapy: Secondary | ICD-10-CM | POA: Insufficient documentation

## 2019-06-23 HISTORY — PX: CARPAL TUNNEL RELEASE: SHX101

## 2019-06-23 LAB — SARS CORONAVIRUS 2 (TAT 6-24 HRS): SARS Coronavirus 2: NEGATIVE

## 2019-06-23 SURGERY — CARPAL TUNNEL RELEASE
Anesthesia: General | Laterality: Left

## 2019-06-23 MED ORDER — ONDANSETRON HCL 4 MG/2ML IJ SOLN
INTRAMUSCULAR | Status: AC
  Filled 2019-06-23: qty 2

## 2019-06-23 MED ORDER — CEFAZOLIN SODIUM-DEXTROSE 2-4 GM/100ML-% IV SOLN
INTRAVENOUS | Status: AC
  Filled 2019-06-23: qty 100

## 2019-06-23 MED ORDER — LIDOCAINE HCL (CARDIAC) PF 100 MG/5ML IV SOSY
PREFILLED_SYRINGE | INTRAVENOUS | Status: DC | PRN
  Administered 2019-06-23: 100 mg via INTRAVENOUS

## 2019-06-23 MED ORDER — CELECOXIB 200 MG PO CAPS
ORAL_CAPSULE | ORAL | Status: AC
  Filled 2019-06-23: qty 1

## 2019-06-23 MED ORDER — GLYCOPYRROLATE 0.2 MG/ML IJ SOLN
INTRAMUSCULAR | Status: DC | PRN
  Administered 2019-06-23: .2 mg via INTRAVENOUS

## 2019-06-23 MED ORDER — FAMOTIDINE 20 MG PO TABS
ORAL_TABLET | ORAL | Status: AC
  Filled 2019-06-23: qty 1

## 2019-06-23 MED ORDER — PROPOFOL 10 MG/ML IV BOLUS
INTRAVENOUS | Status: AC
  Filled 2019-06-23: qty 20

## 2019-06-23 MED ORDER — DEXAMETHASONE SODIUM PHOSPHATE 10 MG/ML IJ SOLN
INTRAMUSCULAR | Status: AC
  Filled 2019-06-23: qty 1

## 2019-06-23 MED ORDER — DEXAMETHASONE SODIUM PHOSPHATE 10 MG/ML IJ SOLN
INTRAMUSCULAR | Status: DC | PRN
  Administered 2019-06-23: 10 mg via INTRAVENOUS

## 2019-06-23 MED ORDER — CHLORHEXIDINE GLUCONATE 4 % EX LIQD
60.0000 mL | Freq: Once | CUTANEOUS | Status: AC
  Administered 2019-06-23: 4 via TOPICAL

## 2019-06-23 MED ORDER — ONDANSETRON HCL 4 MG/2ML IJ SOLN
INTRAMUSCULAR | Status: DC | PRN
  Administered 2019-06-23: 4 mg via INTRAVENOUS

## 2019-06-23 MED ORDER — MIDAZOLAM HCL 2 MG/2ML IJ SOLN
INTRAMUSCULAR | Status: DC | PRN
  Administered 2019-06-23: 2 mg via INTRAVENOUS

## 2019-06-23 MED ORDER — CELECOXIB 200 MG PO CAPS
400.0000 mg | ORAL_CAPSULE | Freq: Once | ORAL | Status: AC
  Administered 2019-06-23: 400 mg via ORAL

## 2019-06-23 MED ORDER — EPHEDRINE SULFATE 50 MG/ML IJ SOLN
INTRAMUSCULAR | Status: DC | PRN
  Administered 2019-06-23 (×3): 10 mg via INTRAVENOUS

## 2019-06-23 MED ORDER — MIDAZOLAM HCL 2 MG/2ML IJ SOLN
INTRAMUSCULAR | Status: AC
  Filled 2019-06-23: qty 2

## 2019-06-23 MED ORDER — CEFAZOLIN SODIUM-DEXTROSE 2-4 GM/100ML-% IV SOLN
2.0000 g | INTRAVENOUS | Status: AC
  Administered 2019-06-23: 2 g via INTRAVENOUS

## 2019-06-23 MED ORDER — FAMOTIDINE 20 MG PO TABS
20.0000 mg | ORAL_TABLET | Freq: Once | ORAL | Status: AC
  Administered 2019-06-23: 20 mg via ORAL

## 2019-06-23 MED ORDER — FENTANYL CITRATE (PF) 100 MCG/2ML IJ SOLN
INTRAMUSCULAR | Status: DC | PRN
  Administered 2019-06-23: 50 ug via INTRAVENOUS

## 2019-06-23 MED ORDER — HYDROCODONE-ACETAMINOPHEN 5-325 MG PO TABS: 1.0000 | ORAL_TABLET | ORAL | 0 refills | Status: DC | PRN

## 2019-06-23 MED ORDER — LACTATED RINGERS IV SOLN: INTRAVENOUS | Status: DC

## 2019-06-23 MED ORDER — PROPOFOL 10 MG/ML IV BOLUS
INTRAVENOUS | Status: DC | PRN
  Administered 2019-06-23: 110 mg via INTRAVENOUS

## 2019-06-23 MED ORDER — FENTANYL CITRATE (PF) 100 MCG/2ML IJ SOLN
INTRAMUSCULAR | Status: AC
  Filled 2019-06-23: qty 2

## 2019-06-23 MED ORDER — BUPIVACAINE HCL (PF) 0.25 % IJ SOLN
INTRAMUSCULAR | Status: DC | PRN
  Administered 2019-06-23: 10 mL

## 2019-06-23 SURGICAL SUPPLY — 26 items
BNDG ELASTIC 3X5.8 VLCR NS LF (GAUZE/BANDAGES/DRESSINGS) ×3 IMPLANT
BNDG ESMARK 4X12 TAN STRL LF (GAUZE/BANDAGES/DRESSINGS) ×3 IMPLANT
CANISTER SUCT 1200ML W/VALVE (MISCELLANEOUS) ×3 IMPLANT
CAST PADDING 3X4FT ST 30246 (SOFTGOODS) ×2
COVER WAND RF STERILE (DRAPES) ×3 IMPLANT
CUFF TOURN SGL QUICK 18X4 (TOURNIQUET CUFF) ×3 IMPLANT
DRSG DERMACEA 8X12 NADH (GAUZE/BANDAGES/DRESSINGS) ×3 IMPLANT
DURAPREP 26ML APPLICATOR (WOUND CARE) ×3 IMPLANT
ELECT CAUTERY BLADE 6.4 (BLADE) ×3 IMPLANT
ELECT REM PT RETURN 9FT ADLT (ELECTROSURGICAL) ×3
ELECTRODE REM PT RTRN 9FT ADLT (ELECTROSURGICAL) ×1 IMPLANT
GAUZE SPONGE 4X4 12PLY STRL (GAUZE/BANDAGES/DRESSINGS) ×3 IMPLANT
GLOVE BIOGEL M STRL SZ7.5 (GLOVE) ×3 IMPLANT
GLOVE INDICATOR 8.0 STRL GRN (GLOVE) ×3 IMPLANT
GOWN STRL REUS W/ TWL LRG LVL3 (GOWN DISPOSABLE) ×2 IMPLANT
GOWN STRL REUS W/TWL LRG LVL3 (GOWN DISPOSABLE) ×4
KIT TURNOVER KIT A (KITS) ×3 IMPLANT
NS IRRIG 500ML POUR BTL (IV SOLUTION) ×3 IMPLANT
PACK EXTREMITY ARMC (MISCELLANEOUS) ×3 IMPLANT
PAD CAST CTTN 3X4 STRL (SOFTGOODS) ×1 IMPLANT
SOL PREP PVP 2OZ (MISCELLANEOUS) ×3
SOLUTION PREP PVP 2OZ (MISCELLANEOUS) ×1 IMPLANT
SPLINT CAST 1 STEP 3X12 (MISCELLANEOUS) ×3 IMPLANT
STOCKINETTE 48X4 2 PLY STRL (GAUZE/BANDAGES/DRESSINGS) ×1 IMPLANT
STOCKINETTE STRL 4IN 9604848 (GAUZE/BANDAGES/DRESSINGS) ×3 IMPLANT
SUT ETHILON 5-0 FS-2 18 BLK (SUTURE) ×3 IMPLANT

## 2019-06-23 NOTE — Anesthesia Procedure Notes (Signed)
Procedure Name: LMA Insertion Date/Time: 06/23/2019 9:33 AM Performed by: Aline Brochure, CRNA Pre-anesthesia Checklist: Patient identified, Emergency Drugs available, Suction available and Patient being monitored Patient Re-evaluated:Patient Re-evaluated prior to induction Oxygen Delivery Method: Circle system utilized Preoxygenation: Pre-oxygenation with 100% oxygen Induction Type: IV induction Ventilation: Mask ventilation without difficulty LMA: LMA inserted LMA Size: 4.5 Number of attempts: 1 Placement Confirmation: positive ETCO2 and breath sounds checked- equal and bilateral Tube secured with: Tape Dental Injury: Teeth and Oropharynx as per pre-operative assessment

## 2019-06-23 NOTE — H&P (Signed)
ORTHOPAEDIC HISTORY & PHYSICAL  Progress Notes by Gwenlyn Fudge, PA at 06/15/2019 1:00 PM   [facility] Chief Complaint:       Chief Complaint  Patient presents with  . Wrist Pain    H & P LEFT WRIST    History of Present Illness:    Adam Lucas is a 69 y.o. male that presents to clinic today for his preoperative history and evaluation.  Patient presents unaccompanied. The patient is scheduled to undergo a left carpal tunnel release on 06/22/18 by Dr. Marry Guan. The patient reports a long history of numbness over the distribution of the left hand. Patient denies any history of injury to the hand.   The patient's symptoms have progressed to the point that they decrease his quality of life.  The patient has previously undergone conservative treatment including NSAIDS and activity modification without adequate control of his symptoms.  Patient does state that he is scheduled to have a back surgery the week following his carpal tunnel release.  Past Medical, Surgical, Family, Social History, Allergies, Medications:   Past Medical History:      Past Medical History:  Diagnosis Date  . Arthritis   . Carpal tunnel syndrome   . Chickenpox   . Chronic kidney disease   . Heart attack (CMS-HCC)   . Hypogonadism in male   . Low back pain   . Seasonal allergic rhinitis     Past Surgical History:       Past Surgical History:  Procedure Laterality Date  . Benign cyst removed    . COLONOSCOPY  05/23/2003   Adenomatous Polyp  . COLONOSCOPY  05/19/2007   PH Adenomatous Polyp  . COLONOSCOPY  07/17/2010   PH Adenomatous Polyp: CBF 06/2015; Recall Ltr mailed 05/26/2015 (dw)  . COLONOSCOPY  11/02/2015   Adenomatous Polyps: CBF 10/2020  . EGD  07/17/2010   No repeat per RTE  . JOINT REPLACEMENT    . KNEE ARTHROSCOPY    . L3-S1 fusion (pedicle screws and interbody fusion of L5-S1)  07/2003   Dr. Rowe Pavy  . Left total hip arthroplasty  10/20/2017   Dr Marry Guan  . PTCA with stent placement  07/17/2014   DES; RCA and LAD  . Right ear surgery    . Right thumb  2009   ligament removed    Current Medications:  Current Medications        Current Outpatient Medications  Medication Sig Dispense Refill  . acetaminophen (TYLENOL) 650 MG ER tablet Take 1,300 mg by mouth 2 (two) times daily as needed      . alcohol swabs PadM Apply 1 each topically every 21 (twenty-one) days 100 Swab 2  . ascorbic acid, vitamin C, (VITAMIN C) 1000 MG tablet Take 1 tablet by mouth once daily    . aspirin 81 MG EC tablet Take 81 mg by mouth once daily.    . BD INSULIN SYRINGE 1 mL 25 x 1" Syrg     . carvediloL (COREG) 6.25 MG tablet TAKE 1 TABLET BY MOUTH TWICE DAILY WITH MEALS (Patient taking differently: Take 6.25 mg by mouth 2 (two) times daily with meals   ) 180 tablet 1  . cholecalciferol (VITAMIN D3) 1,000 unit capsule Take 1,000 Units by mouth once daily    . cyanocobalamin (VITAMIN B12) 500 MCG tablet Take 500 mcg by mouth once daily       . DULoxetine (CYMBALTA) 60 MG DR  capsule TAKE 1 CAPSULE BY MOUTH ONCE DAILY 30 capsule 5  . ergocalciferol, vitamin D2, 1,250 mcg (50,000 unit) capsule TAKE 1 CAPSULE BY MOUTH ONCE A WEEK (Patient taking differently: Take 50,000 Units by mouth once a week Monday  ) 4 capsule 5  . fluticasone propionate (FLONASE) 50 mcg/actuation nasal spray Place 2 sprays into both nostrils once daily as needed for Rhinitis    . gabapentin (NEURONTIN) 300 MG capsule Take 1 capsule (300 mg total) by mouth 3 (three) times daily 270 capsule 1  . magnesium oxide (MAG-OX) 400 mg (241.3 mg magnesium) tablet Take 400 mg by mouth once daily    . needle, disp, 22 G 22 gauge x 1" Ndle Use 1 each every 21 (twenty-one) days To draw up 0.5 ml of testosterone 200 mg for alternating outer quadrant injections 100 each 1  . nitroGLYcerin (NITROSTAT) 0.4 MG SL tablet Place 1  tablet (0.4 mg total) under the tongue every 5 (five) minutes as needed for Chest pain Up to 3 tablets. 25 tablet 1  . olmesartan-hydrochlorothiazide (BENICAR HCT) 40-12.5 mg tablet Take 1 tablet by mouth once daily 90 tablet 1  . pravastatin (PRAVACHOL) 40 MG tablet Take 40 mg by mouth nightly       . syringe with needle (EASY TOUCH) 3 mL 25 gauge x 1" Syrg Use 1 Syringe every 21 (twenty-one) days 100 Syringe 2  . testosterone cypionate (DEPO-TESTOSTERONE) 100 mg/mL injection Inject 1 mL (100 mg total) into the muscle every 14 (fourteen) days (Patient taking differently: Inject 100 mg into the muscle every 21 (twenty-one) days Due 07/01/2019  ) 10 mL 2  . thiamine (VITAMIN B-1) 100 MG tablet Take 100 mg by mouth once daily    . traMADoL (ULTRAM) 50 mg tablet TAKE 4 TABLETS BY MOUTH ONCE EVERY MORNING AND 2 TABLETS ONCE EVERY EVENING 90 tablet 0  . turmeric-turmeric root extract 450-50 mg Cap Take 1 caplet by mouth once daily       No current facility-administered medications for this visit.       Allergies:       Allergies  Allergen Reactions  . Atorvastatin Muscle Pain and Other (See Comments)    Muscle/leg pain.  Marland Kitchen Crestor [Rosuvastatin] Muscle Pain    Social History:  Social History  Social History        Socioeconomic History  . Marital status: Married    Spouse name: Wilburn Cornelia  . Number of children: 2  . Years of education: 67  . Highest education level: High school graduate  Occupational History  . Occupation: Retired Quarry manager  . Financial resource strain: Not on file  . Food insecurity    Worry: Not on file    Inability: Not on file  . Transportation needs    Medical: Not on file    Non-medical: Not on file  Tobacco Use  . Smoking status: Never Smoker  . Smokeless tobacco: Never Used  Substance and Sexual Activity  . Alcohol use: No    Alcohol/week: 0.0 standard drinks    Comment: rare social   . Drug use: No  .  Sexual activity: Defer  Lifestyle  . Physical activity    Days per week: Not on file    Minutes per session: Not on file  . Stress: Not on file  Relationships  . Social Herbalist on phone: Not on file    Gets together: Not on file  Attends religious service: Not on file    Active member of club or organization: Not on file    Attends meetings of clubs or organizations: Not on file    Relationship status: Not on file  Other Topics Concern  . Not on file  Social History Narrative  . Not on file      Family History:       Family History  Problem Relation Age of Onset  . Pulmonary embolism Mother 63  . Parkinsonism Father   . Diabetes type II Father   . High blood pressure (Hypertension) Father   . Diabetes type II Brother     Review of Systems:   A 10+ ROS was performed, reviewed, and the pertinent orthopaedic findings are documented in the HPI.    Physical Examination:   BP 110/76   Ht 175.3 cm (5\' 9" )   Wt 92.6 kg (204 lb 3.2 oz)   BMI 30.16 kg/m   Patient is a well-developed, well-nourished male in no acute distress. Patient has normal mood and affect. Patient is alert and oriented to person, place, and time.   HEENT: Atraumatic, normocephalic.  Pupils equal and reactive to light.  Extraocular motion intact.  Noninjected sclera.  Cardiovascular: Regular rate and rhythm, with no murmurs, rubs, or gallops.  Distal pulses palpable.  Respiratory: Lungs clear to auscultation bilaterally.   Lefthand: Tenderness:Negative Erythema:negative Swelling:negative Capillary Refill:normal Thenar atrophy:negative Intrinsic wasting:negative Grip strength:fair to goodgrip strength Pincer strength:fair to goodpincer strength Tinel`s test:positive Phalen`s  test:positive Triggering:No gross triggering or locking of the digits Finkelstein`s test:negative Range of motion:Good range of motion of the digits  Sensation decreased over the median distribution, but intact over the ulnar and radial nerve distributions.  Patient able to make an okay sign, thumbs up sign, and crisscross the second and third digits.  Radial pulse 2+  Tests Performed/Reviewed:  X-rays  No new radiographs were obtained today.    Impression:     ICD-10-CM  1. Carpal tunnel syndrome of left wrist  G56.02   Plan:   -Left wrist, carpal tunnel syndrome The patient will benefit from a left carpal tunnel release. The risks of surgery, including infection and blood clots, were discussed with the patient. Having failed conservative treatment, the patient has elected to proceed with surgery.  The risks of surgery, including blood clots and infection, were discussed with the patient.  Measures taken to reduce these risks were also discussed with the patient.  The postoperative course was discussed with the patient.  Patient was instructed to stop all blood thinners prior to surgery. Patient understands and elects to proceed with surgery.  Contact our office with any questions or concerns.  Follow up as indicated, or sooner should any new problems arise, if conditions worsen, or if they are otherwise concerned.   Gwenlyn Fudge, PA [facility] [address] [place], [place] [address] Phone: [phone]  This note was generated in part with voice recognition software and I apologize for any typographical errors that were not detected and corrected.     Electronically signed by Gwenlyn Fudge, [place] on 06/15/2019 6:55 PM

## 2019-06-23 NOTE — Op Note (Signed)
OPERATIVE NOTE  DATE OF SURGERY:  06/23/2019  PATIENT NAME:  Adam Lucas   DOB: [birth year]  MRN: [id number]  PRE-OPERATIVE DIAGNOSIS: Left carpal tunnel syndrome  POST-OPERATIVE DIAGNOSIS:  Same  PROCEDURE:  Left carpal tunnel release  SURGEON:  Marciano Sequin. M.D.  ANESTHESIA: general  ESTIMATED BLOOD LOSS: Minimal  FLUIDS REPLACED: 600 mL of crystalloid  TOURNIQUET TIME: 28 minutes  DRAINS: None  INDICATIONS FOR SURGERY: [name] is a 69 y.o. year old male with a long history of numbness and paresthesias to the left hand. EMG/nerve conduction studies demonstrated findings consistent with carpal tunnel syndrome.The patient had not seen any significant improvement despite conservative nonsurgical intervention. After discussion of the risks and benefits of surgical intervention, the patient expressed understanding of the risks benefits and agree with plans for carpal tunnel release.   PROCEDURE IN DETAIL: The patient was brought into the operating room and after adequate general anesthesia, a tourniquet was placed on the patient's left upper arm.The left hand and arm were prepped with alcohol and Duraprep and draped in the usual sterile fashion. A "time-out" was performed as per usual protocol. The hand and forearm were exsanguinated using an Esmarch and the tourniquet was inflated to 250 mmHg. Loupe magnification was used throughout the procedure. An incision was made just ulnar to the thenar palmar crease. Dissection was carried down through the palmar fascia to the transverse carpal ligament. The transverse carpal ligament was sharply incised, taking care to protect the underlying structures with the carpal tunnel. Complete release of the transverse carpal ligament was achieved. There was no evidence of ganglion cyst or lipoma within the carpal tunnel. The wound was irrigated with copious amounts of normal saline with antibiotic solution. The skin was then re-approximated with  interrupted sutures of #5-0 nylon. A sterile dressing was applied followed by application of a volar splint. The tourniquet was deflated with a total tourniquet time of 28 minutes.  The patient tolerated the procedure well and was transported to the PACU in stable condition.  Jeylin Woodmansee P. Holley Bouche., M.D.

## 2019-06-23 NOTE — Anesthesia Preprocedure Evaluation (Signed)
Anesthesia Evaluation  Patient identified by MRN, date of birth, ID band Patient awake    Reviewed: Allergy & Precautions, H&P , NPO status , Patient's Chart, lab work & pertinent test results, reviewed documented beta blocker date and time   Airway Mallampati: II  TM Distance: >3 FB Neck ROM: full    Dental  (+) Teeth Intact   Pulmonary neg pulmonary ROS,    Pulmonary exam normal        Cardiovascular Exercise Tolerance: Poor hypertension, On Medications + angina with exertion + CAD and + Past MI  negative cardio ROS Normal cardiovascular exam+ Valvular Problems/Murmurs  Rate:Normal     Neuro/Psych  Headaches,  Neuromuscular disease negative neurological ROS  negative psych ROS   GI/Hepatic negative GI ROS, Neg liver ROS,   Endo/Other  negative endocrine ROS  Renal/GU Renal diseasenegative Renal ROS  negative genitourinary   Musculoskeletal   Abdominal   Peds  Hematology negative hematology ROS (+)   Anesthesia Other Findings   Reproductive/Obstetrics negative OB ROS                             Anesthesia Physical Anesthesia Plan  ASA: III  Anesthesia Plan: General LMA   Post-op Pain Management:    Induction:   PONV Risk Score and Plan:   Airway Management Planned:   Additional Equipment:   Intra-op Plan:   Post-operative Plan:   Informed Consent: I have reviewed the patients History and Physical, chart, labs and discussed the procedure including the risks, benefits and alternatives for the proposed anesthesia with the patient or authorized representative who has indicated his/her understanding and acceptance.       Plan Discussed with: CRNA  Anesthesia Plan Comments:         Anesthesia Quick Evaluation

## 2019-06-23 NOTE — H&P (Signed)
The patient has been re-examined, and the chart reviewed, and there have been no interval changes to the documented history and physical.    The risks, benefits, and alternatives have been discussed at length. The patient expressed understanding of the risks benefits and agreed with plans for surgical intervention.  James P. Hooten, Jr. M.D.    

## 2019-06-23 NOTE — Transfer of Care (Signed)
Immediate Anesthesia Transfer of Care Note  Patient: Adam Lucas  Procedure(s) Performed: CARPAL TUNNEL RELEASE (Left )  Patient Location: OR  Anesthesia Type:General  Level of Consciousness: sedated  Airway & Oxygen Therapy: Patient connected to face mask oxygen  Post-op Assessment: Post -op Vital signs reviewed and stable  Post vital signs: stable  Last Vitals:  Vitals Value Taken Time  BP 116/73 06/23/19 1027  Temp 36 C 06/23/19 1027  Pulse 68 06/23/19 1027  Resp 12 06/23/19 1027  SpO2 100 % 06/23/19 1027    Last Pain:  Vitals:   06/23/19 1027  TempSrc: Skin  PainSc:          Complications: No apparent anesthesia complications

## 2019-06-30 NOTE — Anesthesia Postprocedure Evaluation (Signed)
Anesthesia Post Note  Patient: [name]  Procedure(s) Performed: CARPAL TUNNEL RELEASE (Left )  Patient location during evaluation: PACU Anesthesia Type: General Level of consciousness: awake and alert Pain management: pain level controlled Vital Signs Assessment: post-procedure vital signs reviewed and stable Respiratory status: spontaneous breathing, nonlabored ventilation, respiratory function stable and patient connected to nasal cannula oxygen Cardiovascular status: blood pressure returned to baseline and stable Postop Assessment: no apparent nausea or vomiting Anesthetic complications: no     Last Vitals:  Vitals:   06/23/19 1027 06/23/19 1111  BP: 116/73 (!) 158/103  Pulse: 68 84  Resp: 12 16  Temp: (!) 36 C (!) 35.8 C  SpO2: 100% 100%    Last Pain:  Vitals:   06/23/19 1111  TempSrc: Tympanic  PainSc: 5                  Molli Barrows

## 2019-07-08 LAB — LIPID PANEL WITH LDL/HDL RATIO
Cholesterol, Total: 151 mg/dL (ref 100–199)
HDL: 38 mg/dL — ABNORMAL LOW (ref >39)
LDL Chol Calc (NIH): 93 mg/dL (ref 0–99)
LDL/HDL Ratio: 2.4 ratio (ref 0.0–3.6)
Triglycerides: 109 mg/dL (ref 0–149)
VLDL Cholesterol Cal: 20 mg/dL (ref 5–40)

## 2019-07-08 LAB — LDL CHOLESTEROL, DIRECT: LDL Direct: 103 mg/dL — ABNORMAL HIGH (ref 0–99)

## 2019-07-09 ENCOUNTER — Telehealth: Payer: Self-pay

## 2019-07-09 NOTE — Telephone Encounter (Signed)
LMOVM about lab results.

## 2019-07-09 NOTE — Telephone Encounter (Signed)
-----   Message from [name], MD sent at 07/09/2019  4:23 PM EST ----- Normal lipids

## 2020-01-24 ENCOUNTER — Other Ambulatory Visit: Payer: Self-pay | Admitting: Internal Medicine

## 2020-01-24 DIAGNOSIS — M25561 Pain in right knee: Secondary | ICD-10-CM

## 2020-02-03 ENCOUNTER — Other Ambulatory Visit: Payer: Self-pay | Admitting: Internal Medicine

## 2020-02-03 DIAGNOSIS — M25561 Pain in right knee: Secondary | ICD-10-CM

## 2020-02-14 ENCOUNTER — Ambulatory Visit
Admission: RE | Admit: 2020-02-14 | Discharge: 2020-02-14 | Disposition: A | Discharge status: Home / Self Care | Payer: Medicare Other | Source: Ambulatory Visit | Attending: Internal Medicine | Admitting: Internal Medicine

## 2020-02-14 ENCOUNTER — Other Ambulatory Visit: Payer: Self-pay

## 2020-02-14 DIAGNOSIS — M25561 Pain in right knee: Secondary | ICD-10-CM | POA: Diagnosis not present

## 2020-06-05 ENCOUNTER — Ambulatory Visit: Payer: Medicare Other | Admitting: Cardiology

## 2020-06-05 ENCOUNTER — Other Ambulatory Visit: Payer: Self-pay

## 2020-06-05 ENCOUNTER — Encounter: Payer: Self-pay | Admitting: Cardiology

## 2020-06-05 VITALS — BP 135/80 | HR 75 | Ht 69.0 in | Wt 203.0 lb

## 2020-06-05 DIAGNOSIS — I25118 Atherosclerotic heart disease of native coronary artery with other forms of angina pectoris: Secondary | ICD-10-CM

## 2020-06-05 DIAGNOSIS — E7800 Pure hypercholesterolemia, unspecified: Secondary | ICD-10-CM

## 2020-06-05 DIAGNOSIS — I10 Essential (primary) hypertension: Secondary | ICD-10-CM

## 2020-06-05 DIAGNOSIS — R0609 Other forms of dyspnea: Secondary | ICD-10-CM

## 2020-06-05 MED ORDER — NITROGLYCERIN 0.4 MG SL SUBL: 0.4000 mg | SUBLINGUAL_TABLET | SUBLINGUAL | 4 refills | Status: DC | PRN

## 2020-06-05 MED ORDER — EZETIMIBE-SIMVASTATIN 10-10 MG PO TABS: 1.0000 | ORAL_TABLET | Freq: Every day | ORAL | 2 refills | Status: DC

## 2020-06-05 NOTE — Progress Notes (Signed)
Primary Physician/Referring:  Tracie Harrier, MD  Patient ID: Adam Lucas, male    DOB: 04/26/1951, 69 y.o.   MRN: [id number]  Chief Complaint  Patient presents with   Coronary Artery Disease   Hyperlipidemia    HPI: Adam Lucas HASKEW  is a 69 y.o. male  With coronary artery disease, myocardial infarction and angioplasty to RCA and circumflex in 2016, hyperrtension, mild hyperlipidemia, depression, degenerative joint disease, chronic back pain, is here on a  64-monthoffice visit and follow-up.  Patient was previously on low-dose pravastatin as he has had intolerance to Crestor and Lipitor due to severe myalgia in the past.  He has discontinued this due to recurrence of severe myalgias.  For the past 2 months he is also noticed exertional chest tightness similar to his angina along with associated exertional dyspnea.  States that he has not used any sublingual nitroglycerin as it is mild.  Denies symptoms to suggest TIA or claudication.  States that his blood pressure is well controlled.  Continues to have problems with sciatica and neurogenic claudication.     Past Medical History:  Diagnosis Date   Carpal tunnel syndrome    Chronic lower back pain    Coronary artery disease    Degenerative arthritis    Heart murmur ~ 1958   High cholesterol    History of kidney stones    Hypertension    Hypogonadism in male    Kidney stones    Migraine    "once q year or 2" (08/09/2014)   [place]   Neuropathy    Seasonal allergies    Past Surgical History:  Procedure Laterality Date   BACK SURGERY     fusion levels l 3,4,5 s1   CARPAL TUNNEL RELEASE Right 2015   CARPAL TUNNEL RELEASE Left 06/23/2019   Procedure: CARPAL TUNNEL RELEASE;  Surgeon: HDereck Leep MD;  Location: [facility];  Service: Orthopedics;  Laterality: Left;   COLONOSCOPY WITH PROPOFOL N/A 11/02/2015   Procedure: COLONOSCOPY WITH PROPOFOL;  Surgeon: RManya Silvas MD;   [facility];  Service: Endoscopy;  Laterality: N/A;   CORONARY ANGIOGRAM  08/09/2014   Procedure: CORONARY ANGIOGRAM;  Surgeon: JLaverda Page MD;  [facility];  Service: Cardiovascular;;   CORONARY ANGIOPLASTY WITH STENT PLACEMENT  07/17/2014; 08/09/2014   "1; 2"   EXTRACORPOREAL SHOCK WAVE LITHOTRIPSY  2000's X 1   FRACTURE SURGERY     HAND LIGAMENT RECONSTRUCTION Right ~ 2013   "thumb"   HIP SURGERY Left    10/19/17   JOINT REPLACEMENT Left    hip   KNEE ARTHROSCOPY Left ~ 2002   LEFT HEART CATHETERIZATION WITH CORONARY ANGIOGRAM N/A 07/17/2014   Procedure: LEFT HEART CATHETERIZATION WITH CORONARY ANGIOGRAM;  Surgeon: JLaverda Page MD;  [facility];  Service: Cardiovascular;  Laterality: N/A;   ORIF FINGER / THUMB FRACTURE Right ~ 2012   PERCUTANEOUS CORONARY STENT INTERVENTION (PCI-S)  08/09/2014   Procedure: PERCUTANEOUS CORONARY STENT INTERVENTION (PCI-S);  Surgeon: JLaverda Page MD;  [facility];  Service: Cardiovascular;;  mid circ promus 4x28 mid LAD promus 2.75x12   POSTERIOR LUMBAR FUSION 4 LEVEL  2005   "L3, 4, 5; S1"   TONSILLECTOMY AND ADENOIDECTOMY  1970'S   TOTAL HIP ARTHROPLASTY Left 10/20/2017   Procedure: TOTAL HIP ARTHROPLASTY;  Surgeon: HDereck Leep MD;  Location: [facility];  Service: Orthopedics;  Laterality: Left;   Social History  Tobacco Use   Smoking status: Never Smoker   Smokeless tobacco: Never Used  Substance Use Topics   Alcohol use: Not Currently    Comment: 08/09/2014 "I'll have a drink 1-2 times year"   Marital Status: Married   Review of Systems  Constitutional: Negative for chills, decreased appetite, malaise/fatigue and weight gain.  Cardiovascular: Positive for chest pain, claudication (neurogenic) and dyspnea on exertion. Negative for leg swelling and syncope.  Endocrine: Negative for cold intolerance.  Hematologic/Lymphatic: Does not bruise/bleed easily.   Musculoskeletal: Positive for back pain (chronic) and joint pain (chronic DJD). Negative for joint swelling.  Gastrointestinal: Negative for abdominal pain, anorexia, change in bowel habit, hematochezia and melena.  Neurological: Negative for headaches and light-headedness.  Psychiatric/Behavioral: Negative for depression and substance abuse.  All other systems reviewed and are negative.  Objective  Blood pressure 135/80, pulse 75, height 5' 9"  (1.753 m), weight 203 lb (92.1 kg), SpO2 95 %. Body mass index is 29.98 kg/m. Vitals with BMI 06/05/2020 06/23/2019 06/23/2019  Height 5' 9"  - -  Weight 203 lbs - -  BMI 24.26 - -  Systolic 834 196 222  Diastolic 80 979 73  Pulse 75 84 68   Physical Exam Constitutional:      Comments:  He is well-built and well-nourished, mildly obese in no acute distress.  HENT:     Head: Atraumatic.  Eyes:     Conjunctiva/sclera: Conjunctivae normal.  Neck:     Thyroid: No thyromegaly.     Vascular: No JVD.  Cardiovascular:     Rate and Rhythm: Normal rate and regular rhythm.     Pulses: Normal pulses and intact distal pulses.     Heart sounds: Normal heart sounds. No murmur heard. No gallop.      Comments: No pedal edema, no JVD. Pulmonary:     Effort: Pulmonary effort is normal.     Breath sounds: Normal breath sounds.  Abdominal:     General: Bowel sounds are normal.     Palpations: Abdomen is soft.  Musculoskeletal:        General: Normal range of motion.     Cervical back: Neck supple.  Skin:    General: Skin is warm and dry.  Neurological:     Mental Status: He is alert.    Radiology: No results found.  Laboratory examination:   04/26/2019: Potassium 4.2, creatinine 1.7, EGFR 40, CMP otherwise normal.  CBC normal.  02/04/2019: Cholesterol 161, triglycerides 180, HDL 33.1, LDL 92.  Labs 09/28/2018: Total cholesterol 140, triglycerides 153, HDL 28, LDL 81.  Hb 18.4/HCT 58.9, microcytic indicis, RDW 17.9.  Platelets 234.  Serum glucose  94 mg, BUN 20, creatinine 1.3, EGFR greater than 60 mL, CMP otherwise normal.  TSH normal.  Labs 12/09/2017: Cholesterol 133, triglycerides 141, HDL 28, LDL 77.  Potassium 4.3, creatinine 1.4, EGFR 51, CMP normal.  CBC normal.  CMP Latest Ref Rng & Units 10/08/2017 08/10/2014 07/18/2014  Glucose 65 - 99 mg/dL 101(H) 128(H) 118(H)  BUN 6 - 20 mg/dL 21(H) 14 13  Creatinine 0.61 - 1.24 mg/dL 1.05 1.12 1.13  Sodium 135 - 145 mmol/L 136 138 136  Potassium 3.5 - 5.1 mmol/L 3.7 4.2 3.9  Chloride 101 - 111 mmol/L 104 105 105  CO2 22 - 32 mmol/L 25 29 25   Calcium 8.9 - 10.3 mg/dL 9.2 8.8 8.9  Total Protein 6.5 - 8.1 g/dL 7.8 - -  Total Bilirubin 0.3 - 1.2 mg/dL 0.4 - -  Alkaline  Phos 38 - 126 U/L 62 - -  AST 15 - 41 U/L 21 - -  ALT 17 - 63 U/L 19 - -   CBC Latest Ref Rng & Units 10/08/2017 08/10/2014 07/18/2014  WBC 3.8 - 10.6 K/uL 13.7(H) 7.4 14.0(H)  Hemoglobin 13.0 - 18.0 g/dL 15.6 12.8(L) 13.0  Hematocrit 40.0 - 52.0 % 48.3 40.7 40.6  Platelets 150 - 440 K/uL 237 222 207   Lipid Panel     Component Value Date/Time   CHOL 151 07/07/2019 0821   TRIG 109 07/07/2019 0821   HDL 38 (L) 07/07/2019 0821   CHOLHDL 4.5 07/17/2014 1830   VLDL 16 07/17/2014 1830   LDLCALC 93 07/07/2019 0821   LDLDIRECT 103 (H) 07/07/2019 0821    External labs:   Labs 04/19/2020:  Serum glucose 93 sodium 139, potassium 4.1, BUN 20, creatinine 1.2, EGFR 60 mL, CMP otherwise normal.  Total cholesterol 185, triglycerides 171, HDL 35, LDL 116.  TSH 3.141.  Medications   Current Outpatient Medications on File Prior to Visit  Medication Sig Dispense Refill   acetaminophen (TYLENOL) 650 MG CR tablet Take 1,300 mg by mouth 2 (two) times daily.     aspirin EC 81 MG tablet Take 81 mg by mouth daily.     busPIRone (BUSPAR) 15 MG tablet Take 1 tablet by mouth in the morning and at bedtime.     carvedilol (COREG) 6.25 MG tablet Take 1 tablet (6.25 mg total) by mouth 2 (two) times daily with a meal. 60 tablet 1    cholecalciferol (VITAMIN D3) 25 MCG (1000 UT) tablet Take 1,000 Units by mouth daily.     cyanocobalamin 1000 MCG tablet Take 1,000 mcg by mouth daily.      DULoxetine (CYMBALTA) 60 MG capsule Take 60 mg by mouth daily.      ergocalciferol (VITAMIN D2) 1.25 MG (50000 UT) capsule Take 50,000 Units by mouth once a week.     fluticasone (FLONASE) 50 MCG/ACT nasal spray Place 1 spray into both nostrils daily as needed for allergies.     gabapentin (NEURONTIN) 100 MG capsule Take 300 mg by mouth 3 (three) times daily.      magnesium oxide (MAG-OX) 400 MG tablet Take 400 mg by mouth daily.     olmesartan (BENICAR) 20 MG tablet Take 20 mg by mouth daily.     testosterone cypionate (DEPOTESTOSTERONE CYPIONATE) 200 MG/ML injection Inject 100 mg into the muscle every 14 (fourteen) days.     testosterone cypionate (DEPOTESTOTERONE CYPIONATE) 100 MG/ML injection Inject 100 mg into the muscle every 21 ( twenty-one) days.   0   Thiamine HCl (VITAMIN B-1) 250 MG tablet Take 250 mg by mouth daily.      traMADol (ULTRAM) 50 MG tablet Take 1-2 tablets (50-100 mg total) by mouth every 4 (four) hours as needed for moderate pain. (Patient taking differently: Take 100-200 mg by mouth See admin instructions. Take 200 mg by mouth in the morning and 100 mg at bedtime) 60 tablet 0   Turmeric Curcumin 500 MG CAPS Take 500 mg by mouth at bedtime.     vitamin C (ASCORBIC ACID) 500 MG tablet Take 500 mg by mouth daily.     No current facility-administered medications on file prior to visit.     Cardiac Studies:   Coronary angiogram 07/17/2014: Mid RCA with drug-eluting stent placement for MI and staged PCI 08/09/2014: Midcircumflex 4.0 x 28 mm, mid LAD 2.75 x 12 mm Promus Premier DES stent.  Echocardiogram 09/14/2014: Left ventricle cavity is normal in size. Normal global wall motion. Normal diastolic filling pattern. Calculated EF 60%. The Doppler, spectral and color flow examinations are unremarkable and show no  significant flow abnormalities of the aortic, mitral, tricuspid or pulmonic valves.  EKG:    EKG 06/05/2020: Normal sinus rhythm at the rate of 80 bpm, left axis deviation, left anterior fascicular block.  Right bundle branch block.  Bifascicular block.  No evidence of ischemia.  Low-voltage complexes.  No significant change from 06/04/2019.  Assessment   1. Coronary artery disease of native artery of native heart with stable angina pectoris ([place])   2. Dyspnea on exertion   3. Hypercholesteremia   4. HTN (hypertension), benign    Meds ordered this encounter  Medications   ezetimibe-simvastatin (VYTORIN) 10-10 MG tablet    Sig: Take 1 tablet by mouth at bedtime.    Dispense:  30 tablet    Refill:  2   nitroGLYCERIN (NITROSTAT) 0.4 MG SL tablet    Sig: Place 1 tablet (0.4 mg total) under the tongue every 5 (five) minutes x 3 doses as needed for chest pain.    Dispense:  25 tablet    Refill:  4   Medications Discontinued During This Encounter  Medication Reason   omeprazole (PRILOSEC) 20 MG capsule Completed Course   pravastatin (PRAVACHOL) 40 MG tablet Side effect (s)   HYDROcodone-acetaminophen (NORCO) 5-325 MG tablet Completed Course   olmesartan-hydrochlorothiazide (BENICAR HCT) 40-12.5 MG tablet Change in therapy   nitroGLYCERIN (NITROSTAT) 0.4 MG SL tablet Reorder   pravastatin (PRAVACHOL) 40 MG tablet Side effect (s)     Recommendations:   HPI: BURLEY KOPKA  is a 69 y.o. coronary artery disease, myocardial infarction and angioplasty to RCA and circumflex in 2016, hyperrtension, mild hyperlipidemia, depression, degenerative joint disease, chronic back pain, is here on a  81-monthoffice visit and follow-up.  She has no developed exertional chest pain suggestive of angina pectoris associated with dyspnea on exertion over the past 2 months. Schedule for a Exercise Nuclear stress test to evaluate for myocardial ischemia. Will schedule for an echocardiogram. S/L NTG was  prescribed and explained how to and when to use it and to notify [place] there is change in frequency of use. Interaction with cialis-like agents (if applicable was discussed).   He has not been able to tolerate even low-dose of pravastatin, previously did not tolerate Crestor or atorvastatin.  I will try Vytorin 10/10 mg in the evening, will recheck lipids in 6 weeks.  Like to see him back in 2 months unless his symptoms of angina get worse.  Blood pressure is well controlled.  No clinical evidence of heart failure.  No change in vascular exam.   JAdrian Prows MD, [facility]12/20/2021, 9:34 AM Office: [phone]: [phone]

## 2020-06-14 ENCOUNTER — Ambulatory Visit: Payer: Medicare Other

## 2020-06-14 ENCOUNTER — Other Ambulatory Visit: Payer: Self-pay

## 2020-06-14 DIAGNOSIS — I25118 Atherosclerotic heart disease of native coronary artery with other forms of angina pectoris: Secondary | ICD-10-CM

## 2020-06-19 NOTE — Progress Notes (Signed)
Called pt, no answer. Left vm requesting call back. AD/S

## 2020-06-19 NOTE — Progress Notes (Signed)
Called patient, Adam Lucas, Adam Lucas

## 2020-06-19 NOTE — Progress Notes (Signed)
Patient called back, I have discussed echocardiogram results.

## 2020-06-20 NOTE — Progress Notes (Signed)
Attempted to contact pt, no answer. Left vm requesting cb. AD

## 2020-06-22 NOTE — Progress Notes (Signed)
Called and spoke with pt regarding stress test results. Pt voiced understanding. AD

## 2020-07-27 ENCOUNTER — Other Ambulatory Visit: Payer: Self-pay | Admitting: Cardiology

## 2020-07-28 LAB — LIPID PANEL WITH LDL/HDL RATIO
Cholesterol, Total: 119 mg/dL (ref 100–199)
HDL: 33 mg/dL — ABNORMAL LOW (ref >39)
LDL Chol Calc (NIH): 64 mg/dL (ref 0–99)
LDL/HDL Ratio: 1.9 ratio (ref 0.0–3.6)
Triglycerides: 124 mg/dL (ref 0–149)
VLDL Cholesterol Cal: 22 mg/dL (ref 5–40)

## 2020-08-07 ENCOUNTER — Ambulatory Visit: Payer: Medicare Other | Admitting: Cardiology

## 2020-08-07 ENCOUNTER — Other Ambulatory Visit: Payer: Self-pay

## 2020-08-07 ENCOUNTER — Encounter: Payer: Self-pay | Admitting: Cardiology

## 2020-08-07 VITALS — BP 126/85 | HR 78 | Temp 97.7°F | Resp 16 | Ht 69.0 in | Wt 208.2 lb

## 2020-08-07 DIAGNOSIS — I10 Essential (primary) hypertension: Secondary | ICD-10-CM

## 2020-08-07 DIAGNOSIS — R0600 Dyspnea, unspecified: Secondary | ICD-10-CM

## 2020-08-07 DIAGNOSIS — I25118 Atherosclerotic heart disease of native coronary artery with other forms of angina pectoris: Secondary | ICD-10-CM

## 2020-08-07 DIAGNOSIS — R0609 Other forms of dyspnea: Secondary | ICD-10-CM

## 2020-08-07 DIAGNOSIS — E7800 Pure hypercholesterolemia, unspecified: Secondary | ICD-10-CM

## 2020-08-07 MED ORDER — EZETIMIBE-SIMVASTATIN 10-10 MG PO TABS: 1.0000 | ORAL_TABLET | Freq: Every day | ORAL | 3 refills | Status: AC

## 2020-08-07 NOTE — Progress Notes (Signed)
Primary Physician/Referring:  Tracie Harrier, MD  Patient ID: Adam Lucas, male    DOB: 1950/12/07, 70 y.o.   MRN: [id number]  Chief Complaint  Patient presents with  . Coronary Artery Disease  . Follow-up    2 months    HPI: Adam Lucas  is a 70 y.o. male  With coronary artery disease, myocardial infarction and angioplasty to RCA and circumflex in 2016, hyperrtension, mild hyperlipidemia, depression, degenerative joint disease, chronic back pain, statin intolerance, on his last office visit 3 months ago had symptoms suggestive of angina pectoris with exertional chest pain and associated shortness of breath.  He now presents for follow-up.  I had started him on simvastatin/Zetia 10/10 mg combination.  He also underwent echocardiogram and stress test.  He has not had any recurrence of anginal symptoms, he still has dyspnea on exertion.  No leg edema, no PND or orthopnea. States that he has not used any sublingual nitroglycerin as it is mild.  Denies symptoms to suggest TIA or claudication. Continues to have problems with sciatica and neurogenic claudication.     Past Medical History:  Diagnosis Date  . Carpal tunnel syndrome   . Chronic lower back pain   . Coronary artery disease   . Degenerative arthritis   . Heart murmur ~ 1958  . High cholesterol   . History of kidney stones   . Hypertension   . Hypogonadism in male   . Kidney stones   . Migraine    "once q year or 2" (08/09/2014)  . Myocardial infarction ([place]) 07/17/2014  . Neuropathy   . Seasonal allergies    Past Surgical History:  Procedure Laterality Date  . BACK SURGERY     fusion levels l 3,4,5 s1  . CARPAL TUNNEL RELEASE Right 2015  . CARPAL TUNNEL RELEASE Left 06/23/2019   Procedure: CARPAL TUNNEL RELEASE;  Surgeon: Dereck Leep, MD;  Location: [facility] ORS;  Service: Orthopedics;  Laterality: Left;  . COLONOSCOPY WITH PROPOFOL N/A 11/02/2015   Procedure: COLONOSCOPY WITH PROPOFOL;  Surgeon: Manya Silvas, MD;  Location: [facility] ENDOSCOPY;  Service: Endoscopy;  Laterality: N/A;  . CORONARY ANGIOGRAM  08/09/2014   Procedure: CORONARY ANGIOGRAM;  Surgeon: [name], MD;  Location[facility] CATH LAB;  Service: Cardiovascular;;  . CORONARY ANGIOPLASTY WITH STENT PLACEMENT  07/17/2014; 08/09/2014   "1; 2"  . EXTRACORPOREAL SHOCK WAVE LITHOTRIPSY  2000's X 1  . FRACTURE SURGERY    . HAND LIGAMENT RECONSTRUCTION Right ~ 2013   "thumb"  . HIP SURGERY Left    10/19/17  . JOINT REPLACEMENT Left    hip  . KNEE ARTHROSCOPY Left ~ 2002  . LEFT HEART CATHETERIZATION WITH CORONARY ANGIOGRAM N/A 07/17/2014   Procedure: LEFT HEART CATHETERIZATION WITH CORONARY ANGIOGRAM;  Surgeon: [name], MD;  Location: [facility] CATH LAB;  Service: Cardiovascular;  Laterality: N/A;  . ORIF FINGER / THUMB FRACTURE Right ~ 2012  . PERCUTANEOUS CORONARY STENT INTERVENTION (PCI-S)  08/09/2014   Procedure: PERCUTANEOUS CORONARY STENT INTERVENTION (PCI-S);  Surgeon: [name], MD;  Location: [facility] CATH LAB;  Service: Cardiovascular;;  mid circ promus 4x28 mid LAD promus 2.75x12  . POSTERIOR LUMBAR FUSION 4 LEVEL  2005   "L3, 4, 5; S1"  . TONSILLECTOMY AND ADENOIDECTOMY  1970'S  . TOTAL HIP ARTHROPLASTY Left 10/20/2017   Procedure: TOTAL HIP ARTHROPLASTY;  Surgeon: Dereck Leep, MD;  Location: [facility] ORS;  Service: Orthopedics;  Laterality: Left;   Social  History   Tobacco Use  . Smoking status: Never Smoker  . Smokeless tobacco: Never Used  Substance Use Topics  . Alcohol use: Not Currently    Comment: 08/09/2014 "I'll have a drink 1-2 times year"   Marital Status: Married   Review of Systems  Cardiovascular: Positive for claudication (neurogenic) and dyspnea on exertion. Negative for chest pain, leg swelling and syncope.  Hematologic/Lymphatic: Does not bruise/bleed easily.  Musculoskeletal: Positive for back pain (chronic) and joint pain (chronic DJD).  Gastrointestinal: Negative for hematochezia and  melena.  Neurological: Negative for headaches and light-headedness.  All other systems reviewed and are negative.  Objective  Blood pressure 126/85, pulse 78, temperature 97.7 F (36.5 C), temperature source Temporal, resp. rate 16, height _0  (1.753 m), weight 208 lb 3.2 oz (94.4 kg), SpO2 95 %. Body mass index is 30.75 kg/m. Vitals with BMI 08/07/2020 06/05/2020 06/23/2019  Height _1  _2  -  Weight 208 lbs 3 oz 203 lbs -  BMI 94.58 59.29 -  Systolic 244 628 638  Diastolic 85 80 177  Pulse 78 75 84   Physical Exam Constitutional:      Comments:  He is well-built and well-nourished, mildly obese in no acute distress.  Neck:     Thyroid: No thyromegaly.     Vascular: No JVD.  Cardiovascular:     Rate and Rhythm: Normal rate and regular rhythm.     Pulses: Normal pulses and intact distal pulses.     Heart sounds: Normal heart sounds. No murmur heard. No gallop.      Comments: No pedal edema, no JVD. Pulmonary:     Effort: Pulmonary effort is normal.     Breath sounds: Normal breath sounds.  Abdominal:     General: Bowel sounds are normal.     Palpations: Abdomen is soft.  Musculoskeletal:        General: Normal range of motion.     Cervical back: Neck supple.  Skin:    General: Skin is warm and dry.  Neurological:     Mental Status: He is alert.    Radiology: No results found.  Laboratory examination:   04/26/2019: Potassium 4.2, creatinine 1.7, EGFR 40, CMP otherwise normal.  CBC normal.  02/04/2019: Cholesterol 161, triglycerides 180, HDL 33.1, LDL 92.  Labs 09/28/2018: Total cholesterol 140, triglycerides 153, HDL 28, LDL 81.  Hb 18.4/HCT 58.9, microcytic indicis, RDW 17.9.  Platelets 234.  Serum glucose 94 mg, BUN 20, creatinine 1.3, EGFR greater than 60 mL, CMP otherwise normal.  TSH normal.  Labs 12/09/2017: Cholesterol 133, triglycerides 141, HDL 28, LDL 77.  Potassium 4.3, creatinine 1.4, EGFR 51, CMP normal.  CBC normal.  CMP Latest Ref Rng & Units  10/08/2017 08/10/2014 07/18/2014  Glucose 65 - 99 mg/dL 101(H) 128(H) 118(H)  BUN 6 - 20 mg/dL 21(H) 14 13  Creatinine 0.61 - 1.24 mg/dL 1.05 1.12 1.13  Sodium 135 - 145 mmol/L 136 138 136  Potassium 3.5 - 5.1 mmol/L 3.7 4.2 3.9  Chloride 101 - 111 mmol/L 104 105 105  CO2 22 - 32 mmol/L _3 Calcium 8.9 - 10.3 mg/dL 9.2 8.8 8.9  Total Protein 6.5 - 8.1 g/dL 7.8 - -  Total Bilirubin 0.3 - 1.2 mg/dL 0.4 - -  Alkaline Phos 38 - 126 U/L 62 - -  AST 15 - 41 U/L 21 - -  ALT 17 - 63 U/L 19 - -   CBC Latest Ref Rng &  Units 10/08/2017 08/10/2014 07/18/2014  WBC 3.8 - 10.6 K/uL 13.7(H) 7.4 14.0(H)  Hemoglobin 13.0 - 18.0 g/dL 15.6 12.8(L) 13.0  Hematocrit 40.0 - 52.0 % 48.3 40.7 40.6  Platelets 150 - 440 K/uL 237 222 207   Lipid Panel Recent Labs    07/27/20 0823  CHOL 119  TRIG 124  LDLCALC 64  HDL 33*    External labs:   Labs 04/19/2020:  Serum glucose 93 sodium 139, potassium 4.1, BUN 20, creatinine 1.2, EGFR 60 mL, CMP otherwise normal.  Total cholesterol 185, triglycerides 171, HDL 35, LDL 116.  TSH 3.141.  Medications   Current Outpatient Medications on File Prior to Visit  Medication Sig Dispense Refill  . acetaminophen (TYLENOL) 650 MG CR tablet Take 1,300 mg by mouth 2 (two) times daily.    Marland Kitchen aspirin EC 81 MG tablet Take 81 mg by mouth daily.    . busPIRone (BUSPAR) 15 MG tablet Take 1 tablet by mouth in the morning and at bedtime.    . carvedilol (COREG) 6.25 MG tablet Take 1 tablet (6.25 mg total) by mouth 2 (two) times daily with a meal. 60 tablet 1  . cyanocobalamin 1000 MCG tablet Take 1,000 mcg by mouth daily.     . DULoxetine (CYMBALTA) 60 MG capsule Take 60 mg by mouth daily.     . ergocalciferol (VITAMIN D2) 1.25 MG (50000 UT) capsule Take 50,000 Units by mouth once a week.    . fluticasone (FLONASE) 50 MCG/ACT nasal spray Place 1 spray into both nostrils daily as needed for allergies.    Marland Kitchen gabapentin (NEURONTIN) 100 MG capsule Take 300 mg by mouth 3 (three)  times daily.     . magnesium oxide (MAG-OX) 400 MG tablet Take 400 mg by mouth daily.    . nitroGLYCERIN (NITROSTAT) 0.4 MG SL tablet Place 1 tablet (0.4 mg total) under the tongue every 5 (five) minutes x 3 doses as needed for chest pain. 25 tablet 4  . olmesartan (BENICAR) 20 MG tablet Take 20 mg by mouth daily.    Marland Kitchen testosterone cypionate (DEPOTESTOTERONE CYPIONATE) 100 MG/ML injection Inject 100 mg into the muscle every 21 ( twenty-one) days.   0  . Thiamine HCl (VITAMIN B-1) 250 MG tablet Take 250 mg by mouth daily.     . traMADol (ULTRAM) 50 MG tablet Take 1-2 tablets (50-100 mg total) by mouth every 4 (four) hours as needed for moderate pain. (Patient taking differently: Take 100-200 mg by mouth See admin instructions. Take 200 mg by mouth in the morning and 100 mg at bedtime) 60 tablet 0  . Turmeric Curcumin 500 MG CAPS Take 500 mg by mouth at bedtime.    . vitamin C (ASCORBIC ACID) 500 MG tablet Take 500 mg by mouth daily.     No current facility-administered medications on file prior to visit.     Cardiac Studies:   Coronary angiogram 07/17/2014: Mid RCA with drug-eluting stent placement for MI and staged PCI 08/09/2014: Midcircumflex 4.0 x 28 mm, mid LAD 2.75 x 12 mm Promus Premier DES stent.  Echocardiogram 06/14/2020: Left ventricle cavity is normal in size and wall thickness. Normal global wall motion. Normal LV systolic function with EF 57%. Doppler evidence of grade I (impaired) diastolic dysfunction, normal LAP.  No significant valvular abnormalities. Normal right atrial pressure. No significant change compared to previous study in 2016.  Lexiscan Tetrofosmin stress test 06/14/2020: Lexiscan/modified Bruce nuclear stress test performed using 1-day protocol.  Stress EKG is non-diagnostic,  as this is pharmacological stress test. In addition, stress EKG at 80% MPHR showed sinus tachycardia, RBBB, LAFB, possible old inferior infarct.  Normal myocardial perfusion. Stress LVEF  67%.   EKG:    EKG 06/05/2020: Normal sinus rhythm at the rate of 80 bpm, left axis deviation, left anterior fascicular block.  Right bundle branch block.  Bifascicular block.  No evidence of ischemia.  Low-voltage complexes.  No significant change from 06/04/2019.  Assessment   1. Coronary artery disease of native artery of native heart with stable angina pectoris ([place])   2. Hypercholesteremia   3. Dyspnea on exertion   4. HTN (hypertension), benign    Meds ordered this encounter  Medications  . ezetimibe-simvastatin (VYTORIN) 10-10 MG tablet    Sig: Take 1 tablet by mouth at bedtime.    Dispense:  90 tablet    Refill:  3   Medications Discontinued During This Encounter  Medication Reason  . cholecalciferol (VITAMIN D3) 25 MCG (1000 UT) tablet Error  . testosterone cypionate (DEPOTESTOSTERONE CYPIONATE) 200 MG/ML injection Error  . ezetimibe-simvastatin (VYTORIN) 10-10 MG tablet Reorder     Recommendations:   HPI: Adam Lucas  is a 70 y.o. with coronary artery disease, myocardial infarction and angioplasty to RCA and circumflex in 2016, hyperrtension, mild hyperlipidemia, depression, degenerative joint disease, chronic back pain, statin intolerance, on his last office visit 3 months ago had symptoms suggestive of angina pectoris with exertional chest pain and associated shortness of breath.  I reviewed his stress test and echocardiogram, his dyspnea is related to diastolic dysfunction, I encouraged him to continue to exercise as much as he wants without any limitations.  We have a 10 pound weight loss goal until his next office visit in 6 months.  With regard to hyperlipidemia, since being on Vytorin 10/10, lipids under excellent control and at goal.  I refilled his prescription.  Otherwise his blood pressure is well controlled, risk factors are well controlled, I will see him back in 6 months.   [name], MD, [facility] 08/07/2020, 11:04 AM Office: [phone] Pager:  [phone]

## 2020-12-14 IMAGING — MR MR THORACIC SPINE W/O CM
5 of 6 series · 28 of 48 positions shown · non-contrast
Comparison: No prior thoracic imaging.

CLINICAL DATA: 67-year-old male with chronic mid back pain
radiating to the hips. No known injury.

EXAM:
MRI THORACIC SPINE WITHOUT CONTRAST
TECHNIQUE: Multiplanar, multisequence MR imaging of the thoracic spine was
performed. No intravenous contrast was administered.

[Series 16: T1 · sagittal · 5.0mm · 1.88mm/px · 2 of 9 slices shown (1 of 2)]
[im 1/9]
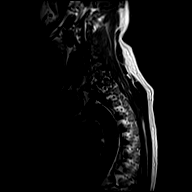
[im 9/9]
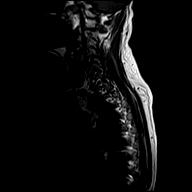

[Series 17: T2 · sagittal · 3.0mm · 1.06mm/px · 6 of 17 slices shown (1 of 2)]
[im 1/17]
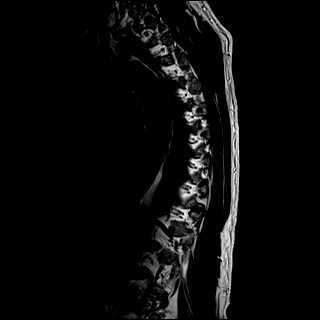
[im 4/17]
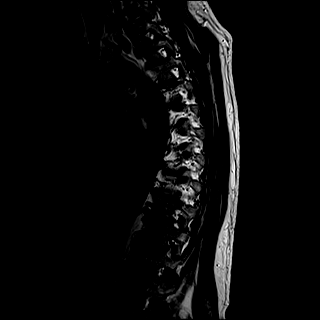
[im 7/17]
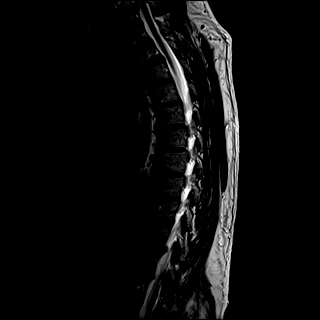
[im 10/17]
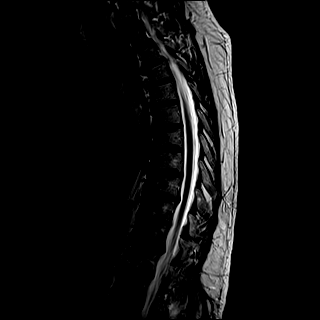
[im 13/17]
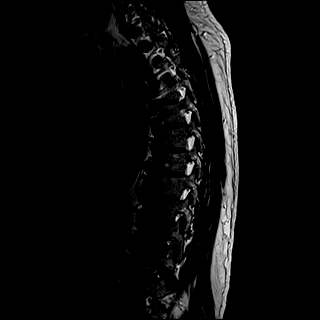
[im 17/17]
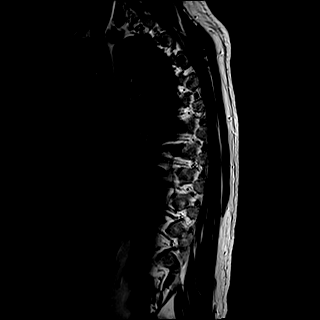

[Series 18: T1 · sagittal · 3.0mm · 1.06mm/px · 6 of 17 slices shown (2 of 2)]
[im 1/17]
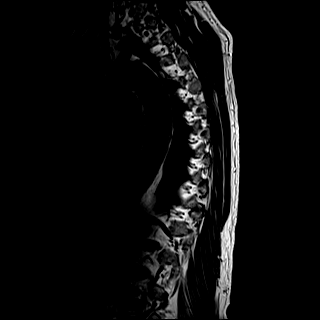
[im 4/17]
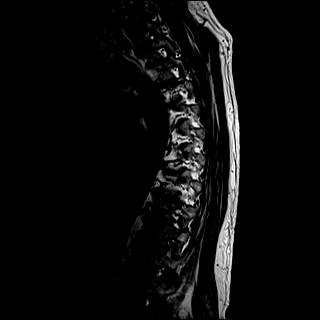
[im 7/17]
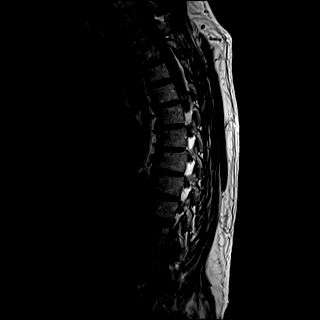
[im 10/17]
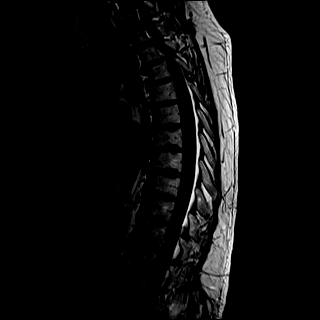
[im 13/17]
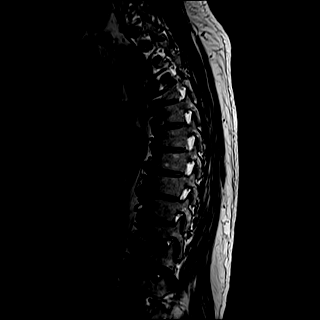
[im 17/17]
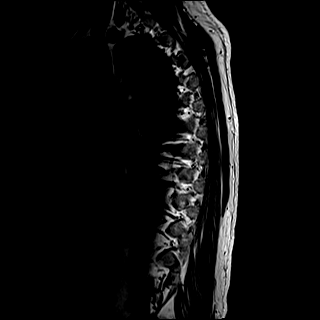

[Series 19: STIR · sagittal · 3.0mm · 0.53mm/px · 6 of 17 slices shown]
[im 1/17]
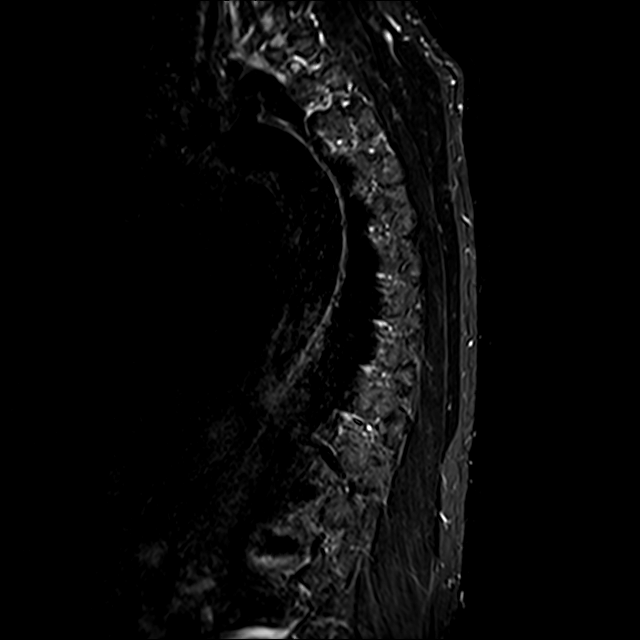
[im 4/17]
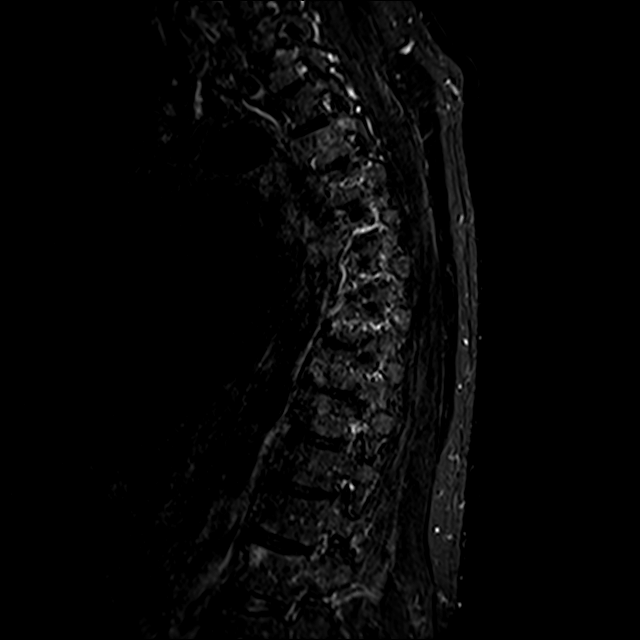
[im 7/17]
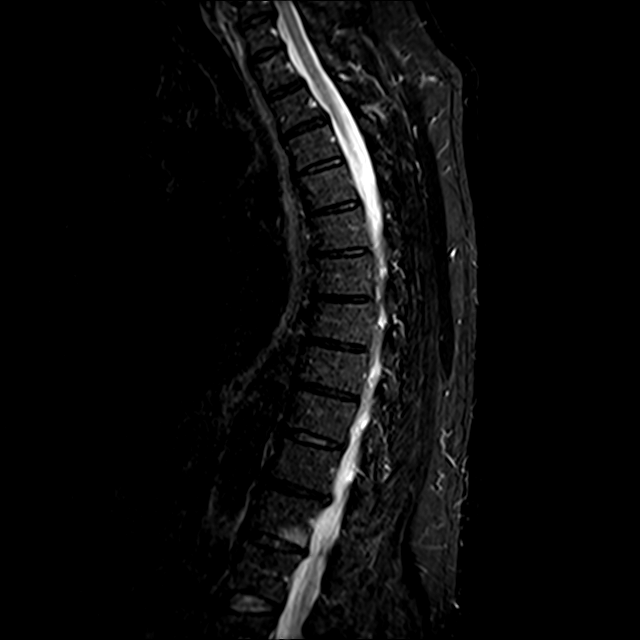
[im 10/17]
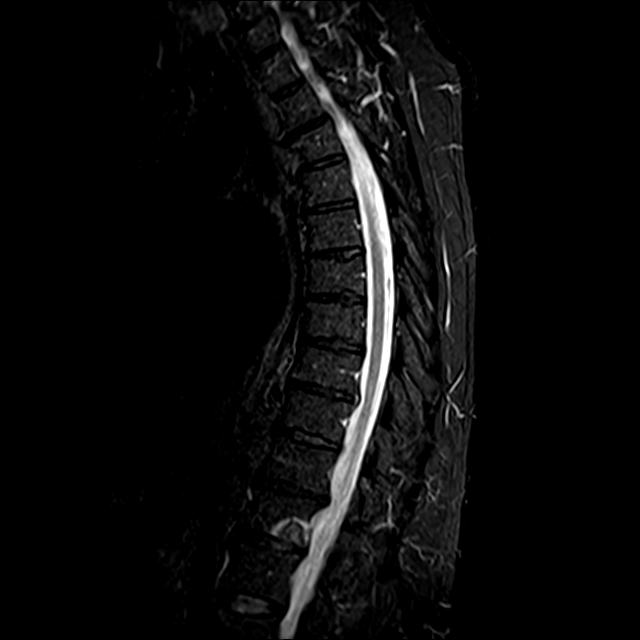
[im 13/17]
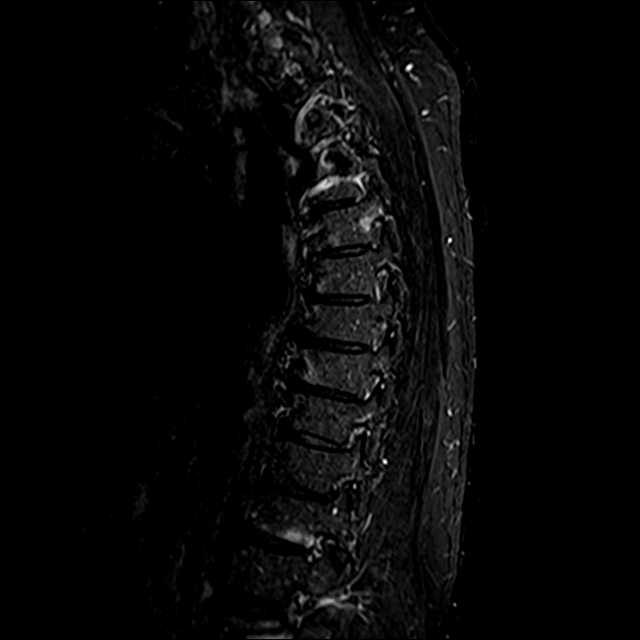
[im 17/17]
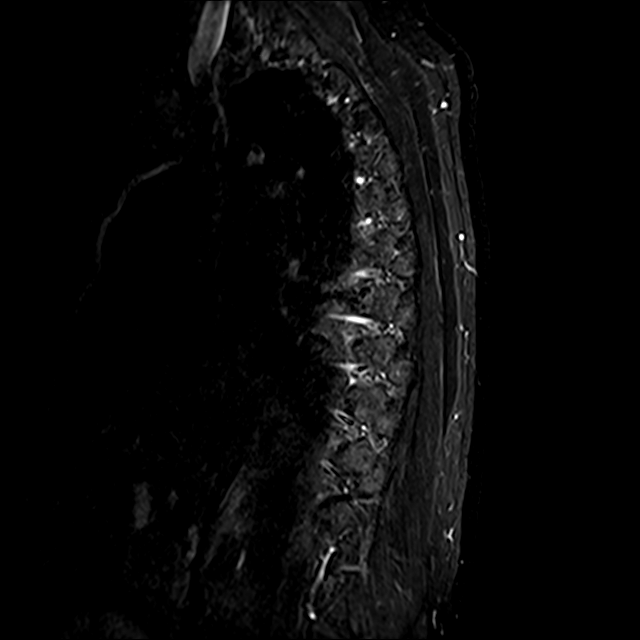

[Series 20: T2 · axial · 4.0mm · 0.59mm/px · z∈[-280,-23]mm · 8 of 40 slices shown (2 of 2)]
[im 1/40]
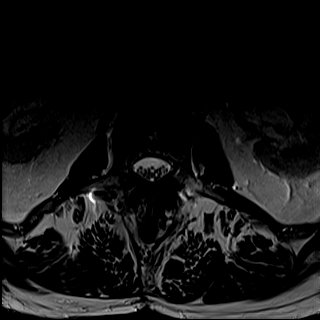
[im 7/40]
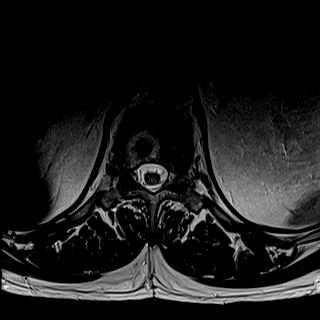
[im 13/40]
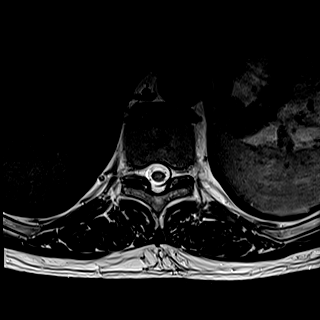
[im 19/40]
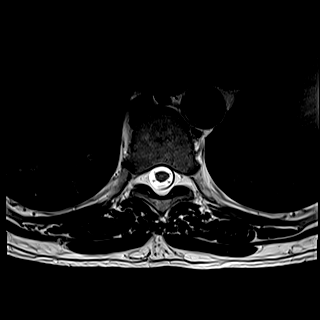
[im 22/40]
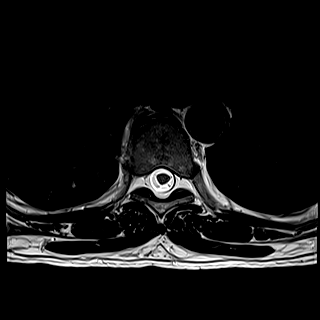
[im 28/40]
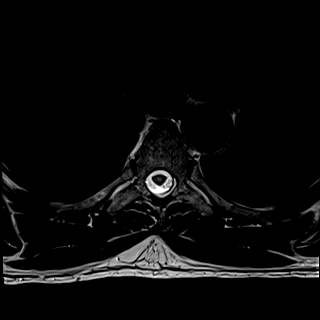
[im 34/40]
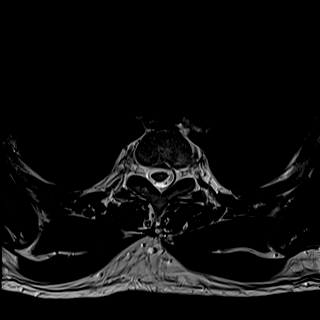
[im 40/40]
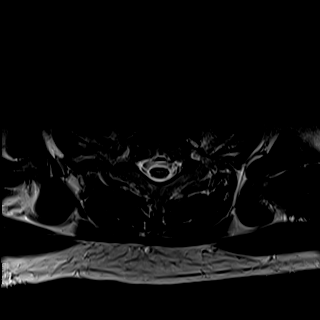

[28 of 48 positions shown; findings below may reference images not displayed]

FINDINGS: Limited cervical spine imaging: Straightening of cervical lordosis
with evidence of C4-C5 and C5-C6 disc degeneration.

Thoracic spine segmentation:  Appears normal.

Alignment: Preserved thoracic kyphosis. Mild degenerative
anterolisthesis at T2-T3. Normal cervicothoracic junction alignment.

Vertebrae: There is confluent marrow edema at the posterior T11
vertebral body which on axial images appears related to a Schmorl's
node (series 20, image 34). Mild adjacent degenerative appearing T12
anterior and leftward endplate marrow edema. Normal background bone
marrow signal. Small thoracic Schmorl's nodes elsewhere. No other
acute osseous abnormality identified.

Cord: Spinal cord signal is within normal limits at all visualized
levels. Partially visible conus medullaris at T12-L1 appears normal.

Paraspinal and other soft tissues: Negative.

Disc levels:

T1-T2: Small central disc protrusion.  No stenosis.

T2-T3: Disc bulge with broad-based posterior component. No stenosis.

T3-T4: Negative.

T4-T5: Negative.

T5-T6: Mild ligament flavum hypertrophy.  No stenosis.

T6-T7: Negative.

T7-T8: Minimal disc bulge.  No stenosis.

T8-T9: Central disc protrusion with annular fissure (series 20,
image 26) partially effaces the ventral CSF space without spinal or
foraminal stenosis.

T9-T10: Subarticular disc bulging with mild posterior element
hypertrophy. No spinal stenosis. Moderate bilateral T9 foraminal
stenosis.

T10-T11: Circumferential disc bulge with broad-based posterior
component. Posterior element hypertrophy. No spinal stenosis.
Moderate bilateral T10 foraminal stenosis.

T11-T12: Mild circumferential disc bulge. There also seems to be a
right foraminal component of disc superimposed on posterior element
hypertrophy. No spinal stenosis. Severe right and mild left T11
foraminal stenosis.
IMPRESSION: 1. T11 inferior endplate marrow edema appears related to a
degenerative Schmorl's node. Questionable right foraminal component
of disc at that level contributing to severe right T11 foraminal
stenosis.
2. Small disc herniation with annular fissure at T8-T9 but no
significant stenosis.
3. Lesser thoracic disc degeneration elsewhere. Moderate bilateral
T9 and T10 neural foraminal stenosis.

## 2021-02-03 NOTE — Progress Notes (Signed)
Primary Physician/Referring:  Tracie Harrier, MD  Patient ID: Adam Lucas, male    DOB: 1950-12-22, 70 y.o.   MRN: [id number]  Chief Complaint  Patient presents with   Chest Pain   Hyperlipidemia   Follow-up    6 months    HPI: Adam Lucas  is a 70 y.o. male  With coronary artery disease, myocardial infarction and angioplasty to RCA and circumflex in 2016, hyperrtension, mild hyperlipidemia, depression, degenerative joint disease, chronic back pain, statin intolerance.   Patient presents for 95-monthfollow-up.  Last office visit risk factors were well controlled and patient was stable from cardiovascular standpoint, therefore no changes were made.  Focused on diet changes and eating less and is now actively working in his shop and around the yard 4 to 5 days/week, he has lost about 10 pounds since last visit.  Patient is congratulated.  Patient has had no recurrence of anginal symptoms.  Denies chest pain, palpitations, dizziness, syncope, near syncope, orthopnea, PND, leg swelling.  Denies symptoms suggestive of TIA/CVA.  He does have chronic dyspnea on exertion, which he states has improved mildly since last visit.  He has not used sublingual nitroglycerin.  Notably patient does have history of sciatica and neurogenic claudication.  Past Medical History:  Diagnosis Date   Carpal tunnel syndrome    Chronic lower back pain    Coronary artery disease    Degenerative arthritis    Heart murmur ~ 1958   High cholesterol    History of kidney stones    Hypertension    Hypogonadism in male    Kidney stones    Migraine    "once q year or 2" (08/09/2014)   Myocardial infarction ([facility] 07/17/2014   Neuropathy    Seasonal allergies    Past Surgical History:  Procedure Laterality Date   BACK SURGERY     fusion levels l 3,4,5 s1   CARPAL TUNNEL RELEASE Right 2015   CARPAL TUNNEL RELEASE Left 06/23/2019   Procedure: CARPAL TUNNEL RELEASE;  Surgeon: HDereck Leep MD;  Location:  [facility];  Service: Orthopedics;  Laterality: Left;   COLONOSCOPY WITH PROPOFOL N/A 11/02/2015   Procedure: COLONOSCOPY WITH PROPOFOL;  Surgeon: RManya Silvas MD;  [facility];  Service: Endoscopy;  Laterality: N/A;   CORONARY ANGIOGRAM  08/09/2014   Procedure: CORONARY ANGIOGRAM;  Surgeon: JLaverda Page MD;  [facility];  Service: Cardiovascular;;   CORONARY ANGIOPLASTY WITH STENT PLACEMENT  07/17/2014; 08/09/2014   "1; 2"   EXTRACORPOREAL SHOCK WAVE LITHOTRIPSY  2000's X 1   FRACTURE SURGERY     HAND LIGAMENT RECONSTRUCTION Right ~ 2013   "thumb"   HIP SURGERY Left    10/19/17   JOINT REPLACEMENT Left    hip   KNEE ARTHROSCOPY Left ~ 2002   LEFT HEART CATHETERIZATION WITH CORONARY ANGIOGRAM N/A 07/17/2014   Procedure: LEFT HEART CATHETERIZATION WITH CORONARY ANGIOGRAM;  Surgeon: JLaverda Page MD;  [facility];  Service: Cardiovascular;  Laterality: N/A;   ORIF FINGER / THUMB FRACTURE Right ~ 2012   PERCUTANEOUS CORONARY STENT INTERVENTION (PCI-S)  08/09/2014   Procedure: PERCUTANEOUS CORONARY STENT INTERVENTION (PCI-S);  Surgeon: JLaverda Page MD;  [facility];  Service: Cardiovascular;;  mid circ promus 4x28 mid LAD promus 2.75x12   POSTERIOR LUMBAR FUSION 4 LEVEL  2005   "L3, 4, 5; S1"   TONSILLECTOMY AND ADENOIDECTOMY  1970'S   TOTAL HIP ARTHROPLASTY Left  10/20/2017   Procedure: TOTAL HIP ARTHROPLASTY;  Surgeon: Dereck Leep, MD;  Location: [facility];  Service: Orthopedics;  Laterality: Left;   Family History  Problem Relation Age of Onset   Deep vein thrombosis Mother    Parkinson's disease Father    Heart attack Father    Social History   Tobacco Use   Smoking status: Never   Smokeless tobacco: Never  Substance Use Topics   Alcohol use: Not Currently    Comment: 08/09/2014 "I'll have a drink 1-2 times year"   Marital Status: Married   Review of Systems  Constitutional: Positive for weight loss (intentional).  Negative for malaise/fatigue.  Cardiovascular:  Positive for claudication (neurogenic) and dyspnea on exertion (improved). Negative for chest pain, leg swelling, near-syncope, orthopnea, palpitations, paroxysmal nocturnal dyspnea and syncope.  Musculoskeletal:  Positive for back pain (chronic) and joint pain (chronic DJD).  All other systems reviewed and are negative. Objective  Blood pressure 117/74, pulse 71, temperature 98.2 F (36.8 C), temperature source Temporal, resp. rate 17, height _0  (1.753 m), weight 199 lb (90.3 kg), SpO2 95 %. Body mass index is 29.39 kg/m. Vitals with BMI 02/05/2021 08/07/2020 06/05/2020  Height _1  _2  _3   Weight 199 lbs 208 lbs 3 oz 203 lbs  BMI 29.37 89.21 19.41  Systolic 740 814 481  Diastolic 74 85 80  Pulse 71 78 75   Physical Exam Vitals reviewed.  Constitutional:      Comments:  He is well-built and well-nourished, mildly obese in no acute distress.  Neck:     Thyroid: No thyromegaly.     Vascular: No JVD.  Cardiovascular:     Rate and Rhythm: Normal rate and regular rhythm.     Pulses: Intact distal pulses.          Popliteal pulses are 2+ on the right side and 2+ on the left side.       Dorsalis pedis pulses are 1+ on the right side and 1+ on the left side.       Posterior tibial pulses are 0 on the right side and 0 on the left side.     Heart sounds: Normal heart sounds, S1 normal and S2 normal. No murmur heard.   No gallop.     Comments: No JVD Pulmonary:     Effort: Pulmonary effort is normal. No respiratory distress.     Breath sounds: Normal breath sounds. No wheezing, rhonchi or rales.  Musculoskeletal:        General: Normal range of motion.     Cervical back: Neck supple.     Right lower leg: No edema.     Left lower leg: No edema.  Neurological:     Mental Status: He is alert.   Laboratory examination:   CMP Latest Ref Rng & Units 10/08/2017 08/10/2014 07/18/2014  Glucose 65 - 99 mg/dL 101(H) 128(H) 118(H)  BUN 6 -  20 mg/dL 21(H) 14 13  Creatinine 0.61 - 1.24 mg/dL 1.05 1.12 1.13  Sodium 135 - 145 mmol/L 136 138 136  Potassium 3.5 - 5.1 mmol/L 3.7 4.2 3.9  Chloride 101 - 111 mmol/L 104 105 105  CO2 22 - 32 mmol/L _4 Calcium 8.9 - 10.3 mg/dL 9.2 8.8 8.9  Total Protein 6.5 - 8.1 g/dL 7.8 - -  Total Bilirubin 0.3 - 1.2 mg/dL 0.4 - -  Alkaline Phos 38 - 126 U/L 62 - -  AST 15 - 41 U/L  21 - -  ALT 17 - 63 U/L 19 - -   CBC Latest Ref Rng & Units 10/08/2017 08/10/2014 07/18/2014  WBC 3.8 - 10.6 K/uL 13.7(H) 7.4 14.0(H)  Hemoglobin 13.0 - 18.0 g/dL 15.6 12.8(L) 13.0  Hematocrit 40.0 - 52.0 % 48.3 40.7 40.6  Platelets 150 - 440 K/uL 237 222 207   Lipid Panel Recent Labs    07/27/20 0823  CHOL 119  TRIG 124  LDLCALC 64  HDL 33*    External labs: 01/03/2021 Hgb 15.6, HCT 48.2, MCV 84.6, platelets 196, TSH 1.819 Total cholesterol 116, triglycerides 97, HDL 35, LDL 61 Sodium 139, potassium 4.6, BUN 19, creatinine 1.1, GFR >60  Labs 04/19/2020:  Serum glucose 93 sodium 139, potassium 4.1, BUN 20, creatinine 1.2, EGFR 60 mL, CMP otherwise normal.  Total cholesterol 185, triglycerides 171, HDL 35, LDL 116.  TSH 3.141.  Allergies   Allergies  Allergen Reactions   Pravastatin Other (See Comments)    Myalgia   Atorvastatin Other (See Comments)    Muscle/leg pain.   Crestor [Rosuvastatin Calcium] Other (See Comments)    Muscle pain    Medications Prior to Visit:   Outpatient Medications Prior to Visit  Medication Sig Dispense Refill   acetaminophen (TYLENOL) 650 MG CR tablet Take 1,300 mg by mouth 2 (two) times daily.     aspirin EC 81 MG tablet Take 81 mg by mouth daily.     busPIRone (BUSPAR) 15 MG tablet Take 1 tablet by mouth in the morning and at bedtime.     carvedilol (COREG) 6.25 MG tablet Take 1 tablet (6.25 mg total) by mouth 2 (two) times daily with a meal. 60 tablet 1   citalopram (CELEXA) 40 MG tablet Take 40 mg by mouth daily.     cyanocobalamin 1000 MCG tablet Take  1,000 mcg by mouth daily.      ergocalciferol (VITAMIN D2) 1.25 MG (50000 UT) capsule Take 50,000 Units by mouth once a week.     ezetimibe-simvastatin (VYTORIN) 10-10 MG tablet Take 1 tablet by mouth at bedtime. 90 tablet 3   fluticasone (FLONASE) 50 MCG/ACT nasal spray Place 1 spray into both nostrils daily as needed for allergies.     gabapentin (NEURONTIN) 100 MG capsule Take 300 mg by mouth 3 (three) times daily.      ketoconazole (NIZORAL) 2 % cream Apply topically daily.     magnesium oxide (MAG-OX) 400 MG tablet Take 400 mg by mouth daily.     nitroGLYCERIN (NITROSTAT) 0.4 MG SL tablet Place 1 tablet (0.4 mg total) under the tongue every 5 (five) minutes x 3 doses as needed for chest pain. 25 tablet 4   olmesartan (BENICAR) 20 MG tablet Take 20 mg by mouth daily.     omeprazole (PRILOSEC) 20 MG capsule Take 20 mg by mouth daily.     testosterone cypionate (DEPOTESTOTERONE CYPIONATE) 100 MG/ML injection Inject 100 mg into the muscle every 21 ( twenty-one) days.   0   Thiamine HCl (VITAMIN B-1) 250 MG tablet Take 250 mg by mouth daily.      traMADol (ULTRAM) 50 MG tablet Take 1-2 tablets (50-100 mg total) by mouth every 4 (four) hours as needed for moderate pain. (Patient taking differently: Take 100-200 mg by mouth See admin instructions. Take 200 mg by mouth in the morning and 100 mg at bedtime) 60 tablet 0   vitamin C (ASCORBIC ACID) 500 MG tablet Take 500 mg by mouth daily.  Turmeric Curcumin 500 MG CAPS Take 500 mg by mouth at bedtime.     DULoxetine (CYMBALTA) 60 MG capsule Take 60 mg by mouth daily.      No facility-administered medications prior to visit.   Final Medications at End of Visit    Current Meds  Medication Sig   acetaminophen (TYLENOL) 650 MG CR tablet Take 1,300 mg by mouth 2 (two) times daily.   aspirin EC 81 MG tablet Take 81 mg by mouth daily.   busPIRone (BUSPAR) 15 MG tablet Take 1 tablet by mouth in the morning and at bedtime.   carvedilol (COREG) 6.25 MG  tablet Take 1 tablet (6.25 mg total) by mouth 2 (two) times daily with a meal.   citalopram (CELEXA) 40 MG tablet Take 40 mg by mouth daily.   cyanocobalamin 1000 MCG tablet Take 1,000 mcg by mouth daily.    ergocalciferol (VITAMIN D2) 1.25 MG (50000 UT) capsule Take 50,000 Units by mouth once a week.   ezetimibe-simvastatin (VYTORIN) 10-10 MG tablet Take 1 tablet by mouth at bedtime.   fluticasone (FLONASE) 50 MCG/ACT nasal spray Place 1 spray into both nostrils daily as needed for allergies.   gabapentin (NEURONTIN) 100 MG capsule Take 300 mg by mouth 3 (three) times daily.    ketoconazole (NIZORAL) 2 % cream Apply topically daily.   magnesium oxide (MAG-OX) 400 MG tablet Take 400 mg by mouth daily.   nitroGLYCERIN (NITROSTAT) 0.4 MG SL tablet Place 1 tablet (0.4 mg total) under the tongue every 5 (five) minutes x 3 doses as needed for chest pain.   olmesartan (BENICAR) 20 MG tablet Take 20 mg by mouth daily.   omeprazole (PRILOSEC) 20 MG capsule Take 20 mg by mouth daily.   testosterone cypionate (DEPOTESTOTERONE CYPIONATE) 100 MG/ML injection Inject 100 mg into the muscle every 21 ( twenty-one) days.    Thiamine HCl (VITAMIN B-1) 250 MG tablet Take 250 mg by mouth daily.    traMADol (ULTRAM) 50 MG tablet Take 1-2 tablets (50-100 mg total) by mouth every 4 (four) hours as needed for moderate pain. (Patient taking differently: Take 100-200 mg by mouth See admin instructions. Take 200 mg by mouth in the morning and 100 mg at bedtime)   vitamin C (ASCORBIC ACID) 500 MG tablet Take 500 mg by mouth daily.    Radiology:  No results found.  Cardiac Studies:   Coronary angiogram 07/17/2014: Mid RCA with drug-eluting stent placement for MI and staged PCI 08/09/2014: Midcircumflex 4.0 x 28 mm, mid LAD 2.75 x 12 mm Promus Premier DES stent.  Echocardiogram 06/14/2020: Left ventricle cavity is normal in size and wall thickness. Normal global wall motion. Normal LV systolic function with EF 57%.  Doppler evidence of grade I (impaired) diastolic dysfunction, normal LAP.  No significant valvular abnormalities. Normal right atrial pressure. No significant change compared to previous study in 2016.  Lexiscan Tetrofosmin stress test 06/14/2020: Lexiscan/modified Bruce nuclear stress test performed using 1-day protocol.  Stress EKG is non-diagnostic, as this is pharmacological stress test. In addition, stress EKG at 80% MPHR showed sinus tachycardia, RBBB, LAFB, possible old inferior infarct.  Normal myocardial perfusion. Stress LVEF 67%.   EKG   02/05/2021: Sinus rhythm at a rate of 67 bpm.  Left axis, left anterior fascicular block.  Right bundle branch block.  Bifascicular block.  Nonspecific T wave abnormality.  Low voltage complexes.  Compared to EKG 06/05/2020, no significant change.  Assessment   1. Coronary artery disease of native artery  of native heart with stable angina pectoris ([place])   2. Hypercholesteremia   3. HTN (hypertension), benign   4. Claudication of both lower extremities ([place])    No orders of the defined types were placed in this encounter.  Medications Discontinued During This Encounter  Medication Reason   DULoxetine (CYMBALTA) 60 MG capsule Error     Recommendations:   HPI: Adam Lucas  is a 70 y.o. With coronary artery disease, myocardial infarction and angioplasty to RCA and circumflex in 2016, hyperrtension, mild hyperlipidemia, depression, degenerative joint disease, chronic back pain, statin intolerance.   Patient presents for 63-monthfollow-up.  Last office visit risk factors were well controlled and patient was stable from cardiovascular standpoint, therefore no changes were made.  Patient is congratulated on his 10 pound weight loss since last visit.  Personally reviewed external labs, lipids are well controlled.  Blood pressure is well controlled.  Patient does have underlying diastolic dysfunction which likely contributes to his dyspnea on  exertion.  Patient's dyspnea on exertion is stable.  On physical exam patient has reduced pedal pulses.  He does have symptoms of sciatica and neurogenic claudication, however previous ABI available for review.  Shared decision was to proceed with baseline ABI at this time.  Medication changes were made today.  Patient is stable from a cardiovascular standpoint.  Follow-up in 6 months, sooner if needed, for CAD, hypertension, hyperlipidemia.   CAlethia Berthold PA-C 02/05/2021, 11:04 AM Office: [phone]

## 2021-02-05 ENCOUNTER — Ambulatory Visit: Payer: Medicare Other | Admitting: Cardiology

## 2021-02-05 ENCOUNTER — Other Ambulatory Visit: Payer: Self-pay

## 2021-02-05 ENCOUNTER — Ambulatory Visit: Payer: Medicare Other | Admitting: Student

## 2021-02-05 ENCOUNTER — Encounter: Payer: Self-pay | Admitting: Student

## 2021-02-05 VITALS — BP 117/74 | HR 71 | Temp 98.2°F | Resp 17 | Ht 69.0 in | Wt 199.0 lb

## 2021-02-05 DIAGNOSIS — I10 Essential (primary) hypertension: Secondary | ICD-10-CM

## 2021-02-05 DIAGNOSIS — I25118 Atherosclerotic heart disease of native coronary artery with other forms of angina pectoris: Secondary | ICD-10-CM

## 2021-02-05 DIAGNOSIS — I739 Peripheral vascular disease, unspecified: Secondary | ICD-10-CM

## 2021-02-05 DIAGNOSIS — E7800 Pure hypercholesterolemia, unspecified: Secondary | ICD-10-CM

## 2021-04-11 ENCOUNTER — Other Ambulatory Visit: Payer: Self-pay

## 2021-04-11 ENCOUNTER — Telehealth: Payer: Self-pay

## 2021-04-11 ENCOUNTER — Ambulatory Visit: Payer: Medicare Other

## 2021-04-11 DIAGNOSIS — Z5309 Procedure and treatment not carried out because of other contraindication: Secondary | ICD-10-CM | POA: Insufficient documentation

## 2021-04-11 DIAGNOSIS — I739 Peripheral vascular disease, unspecified: Secondary | ICD-10-CM

## 2021-04-17 NOTE — Progress Notes (Signed)
Called pt to informed him about his ABI. Pt understood.

## 2021-05-24 NOTE — Telephone Encounter (Signed)
Error

## 2021-07-10 ENCOUNTER — Other Ambulatory Visit: Payer: Self-pay | Admitting: Cardiology

## 2021-07-10 DIAGNOSIS — I25118 Atherosclerotic heart disease of native coronary artery with other forms of angina pectoris: Secondary | ICD-10-CM

## 2021-08-08 ENCOUNTER — Other Ambulatory Visit: Payer: Self-pay

## 2021-08-08 ENCOUNTER — Ambulatory Visit: Payer: Medicare Other | Admitting: Student

## 2021-08-08 ENCOUNTER — Encounter: Payer: Self-pay | Admitting: Student

## 2021-08-08 VITALS — BP 138/74 | HR 94 | Temp 98.4°F | Ht 69.0 in | Wt 205.0 lb

## 2021-08-08 DIAGNOSIS — I25118 Atherosclerotic heart disease of native coronary artery with other forms of angina pectoris: Secondary | ICD-10-CM

## 2021-08-08 DIAGNOSIS — I10 Essential (primary) hypertension: Secondary | ICD-10-CM

## 2021-08-08 DIAGNOSIS — E7800 Pure hypercholesterolemia, unspecified: Secondary | ICD-10-CM

## 2021-08-08 NOTE — Progress Notes (Signed)
Primary Physician/Referring:  Tracie Harrier, MD  Patient ID: [name], male    DOB: [birth year], 71 y.o.   MRN: [id number]  Chief Complaint  Patient presents with   Coronary Artery Disease   Hyperlipidemia   Hypertension    HPI: Adam Lucas  is a 71 y.o. male  With coronary artery disease, myocardial infarction and angioplasty to RCA and circumflex in 2016, hyperrtension, mild hyperlipidemia, depression, degenerative joint disease, chronic back pain, statin intolerance.   Patient presents for 105-monthfollow-up of CAD, hypertension, hyperlipidemia.  Last office visit ordered ABI to evaluate for PAD.  ABI revealed normal perfusion of the right leg and mildly reduced perfusion of the left leg.  Patient is feeling well overall since last office visit, although he does report weight gain due to physical inactivity over the winter.  He continues to have bilateral hip pain for which she follows with his PCP.  Notably patient does have history of sciatica and neurogenic claudication.  Past Medical History:  Diagnosis Date   Carpal tunnel syndrome    Chronic lower back pain    Coronary artery disease    Degenerative arthritis    Heart murmur ~ 1958   High cholesterol    History of kidney stones    Hypertension    Hypogonadism in male    Kidney stones    Migraine    "once q year or 2" (08/09/2014)   [place]   Neuropathy    Seasonal allergies    Past Surgical History:  Procedure Laterality Date   BACK SURGERY     fusion levels l 3,4,5 s1   CARPAL TUNNEL RELEASE Right 2015   CARPAL TUNNEL RELEASE Left 06/23/2019   Procedure: CARPAL TUNNEL RELEASE;  Surgeon: HDereck Leep MD;  Location: [facility] ORS;  Service: Orthopedics;  Laterality: Left;   COLONOSCOPY WITH PROPOFOL N/A 11/02/2015   Procedure: COLONOSCOPY WITH PROPOFOL;  Surgeon: RManya Silvas MD;  [facility];  Service: Endoscopy;  Laterality: N/A;   CORONARY ANGIOGRAM   08/09/2014   Procedure: CORONARY ANGIOGRAM;  Surgeon: JLaverda Page MD;  [facility];  Service: Cardiovascular;;   CORONARY ANGIOPLASTY WITH STENT PLACEMENT  07/17/2014; 08/09/2014   "1; 2"   EXTRACORPOREAL SHOCK WAVE LITHOTRIPSY  2000's X 1   FRACTURE SURGERY     HAND LIGAMENT RECONSTRUCTION Right ~ 2013   "thumb"   HIP SURGERY Left    10/19/17   JOINT REPLACEMENT Left    hip   KNEE ARTHROSCOPY Left ~ 2002   LEFT HEART CATHETERIZATION WITH CORONARY ANGIOGRAM N/A 07/17/2014   Procedure: LEFT HEART CATHETERIZATION WITH CORONARY ANGIOGRAM;  Surgeon: JLaverda Page MD;  [facility];  Service: Cardiovascular;  Laterality: N/A;   ORIF FINGER / THUMB FRACTURE Right ~ 2012   PERCUTANEOUS CORONARY STENT INTERVENTION (PCI-S)  08/09/2014   Procedure: PERCUTANEOUS CORONARY STENT INTERVENTION (PCI-S);  Surgeon: JLaverda Page MD;  [facility];  Service: Cardiovascular;;  mid circ promus 4x28 mid LAD promus 2.75x12   POSTERIOR LUMBAR FUSION 4 LEVEL  2005   "L3, 4, 5; S1"   TONSILLECTOMY AND ADENOIDECTOMY  1970'S   TOTAL HIP ARTHROPLASTY Left 10/20/2017   Procedure: TOTAL HIP ARTHROPLASTY;  Surgeon: HDereck Leep MD;  Location: [facility];  Service: Orthopedics;  Laterality: Left;   Family History  Problem Relation Age of Onset   Deep vein thrombosis Mother    Parkinson's disease Father  Heart attack Father    Social History   Tobacco Use   Smoking status: Never   Smokeless tobacco: Never  Substance Use Topics   Alcohol use: Not Currently    Comment: 08/09/2014 "I'll have a drink 1-2 times year"   Marital Status: Married   Review of Systems  Constitutional: Negative for malaise/fatigue.  Cardiovascular:  Positive for claudication (neurogenic). Negative for chest pain, dyspnea on exertion, leg swelling, near-syncope, orthopnea, palpitations, paroxysmal nocturnal dyspnea and syncope.  Musculoskeletal:  Positive for back pain (chronic) and joint pain  (chronic DJD).  All other systems reviewed and are negative.  Objective  Blood pressure 138/74, pulse 94, temperature 98.4 F (36.9 C), temperature source Temporal, height 5' 9"  (1.753 m), weight 205 lb (93 kg), SpO2 100 %. Body mass index is 30.27 kg/m. Vitals with BMI 08/08/2021 08/08/2021 02/05/2021  Height - 5' 9"  5' 9"   Weight - 205 lbs 199 lbs  BMI - 78.29 56.21  Systolic 308 657 846  Diastolic 74 88 74  Pulse 94 91 71   Physical Exam Vitals reviewed.  Constitutional:      Comments:  He is well-built and well-nourished, mildly obese in no acute distress.  Neck:     Thyroid: No thyromegaly.     Vascular: No carotid bruit or JVD.  Cardiovascular:     Rate and Rhythm: Normal rate and regular rhythm.     Pulses: Intact distal pulses.          Popliteal pulses are 2+ on the right side and 2+ on the left side.       Dorsalis pedis pulses are 1+ on the right side and 1+ on the left side.       Posterior tibial pulses are 0 on the right side and 0 on the left side.     Heart sounds: Normal heart sounds, S1 normal and S2 normal. No murmur heard.   No gallop.     Comments: No JVD Pulmonary:     Effort: Pulmonary effort is normal. No respiratory distress.     Breath sounds: Normal breath sounds. No wheezing, rhonchi or rales.  Musculoskeletal:     Right lower leg: No edema.     Left lower leg: No edema.  Neurological:     Mental Status: He is alert.   Laboratory examination:   CMP Latest Ref Rng & Units 10/08/2017 08/10/2014 07/18/2014  Glucose 65 - 99 mg/dL 101(H) 128(H) 118(H)  BUN 6 - 20 mg/dL 21(H) 14 13  Creatinine 0.61 - 1.24 mg/dL 1.05 1.12 1.13  Sodium 135 - 145 mmol/L 136 138 136  Potassium 3.5 - 5.1 mmol/L 3.7 4.2 3.9  Chloride 101 - 111 mmol/L 104 105 105  CO2 22 - 32 mmol/L 25 29 25   Calcium 8.9 - 10.3 mg/dL 9.2 8.8 8.9  Total Protein 6.5 - 8.1 g/dL 7.8 - -  Total Bilirubin 0.3 - 1.2 mg/dL 0.4 - -  Alkaline Phos 38 - 126 U/L 62 - -  AST 15 - 41 U/L 21 - -  ALT  17 - 63 U/L 19 - -   CBC Latest Ref Rng & Units 10/08/2017 08/10/2014 07/18/2014  WBC 3.8 - 10.6 K/uL 13.7(H) 7.4 14.0(H)  Hemoglobin 13.0 - 18.0 g/dL 15.6 12.8(L) 13.0  Hematocrit 40.0 - 52.0 % 48.3 40.7 40.6  Platelets 150 - 440 K/uL 237 222 207   Lipid Panel No results for input(s): CHOL, TRIG, LDLCALC, VLDL, HDL, CHOLHDL, LDLDIRECT in the last 8760  hours.   External labs: 05/14/2021: TSH 2.196 Total cholesterol 130, triglycerides 114, HDL 41, LDL 66 Sodium 140, potassium 4.4, BUN 19, creatinine 1.1, GFR >60 Hgb 16, HCT 50.4, MCV 85.6, platelet 215  01/03/2021 Hgb 15.6, HCT 48.2, MCV 84.6, platelets 196, TSH 1.819 Total cholesterol 116, triglycerides 97, HDL 35, LDL 61 Sodium 139, potassium 4.6, BUN 19, creatinine 1.1, GFR >60  Labs 04/19/2020:  Serum glucose 93 sodium 139, potassium 4.1, BUN 20, creatinine 1.2, EGFR 60 mL, CMP otherwise normal.  Total cholesterol 185, triglycerides 171, HDL 35, LDL 116.  TSH 3.141.  Allergies   Allergies  Allergen Reactions   Pravastatin Other (See Comments)    Myalgia   Atorvastatin Other (See Comments)    Muscle/leg pain.   Crestor [Rosuvastatin Calcium] Other (See Comments)    Muscle pain    Medications Prior to Visit:   Outpatient Medications Prior to Visit  Medication Sig Dispense Refill   acetaminophen (TYLENOL) 650 MG CR tablet Take 1,300 mg by mouth 2 (two) times daily.     aspirin EC 81 MG tablet Take 81 mg by mouth daily.     buPROPion (WELLBUTRIN XL) 150 MG 24 hr tablet Take 150 mg by mouth daily.     busPIRone (BUSPAR) 15 MG tablet Take 1 tablet by mouth in the morning and at bedtime.     carvedilol (COREG) 6.25 MG tablet Take 1 tablet (6.25 mg total) by mouth 2 (two) times daily with a meal. 60 tablet 1   citalopram (CELEXA) 40 MG tablet Take 40 mg by mouth daily.     cyanocobalamin 1000 MCG tablet Take 1,000 mcg by mouth daily.      ergocalciferol (VITAMIN D2) 1.25 MG (50000 UT) capsule Take 50,000 Units by mouth once  a week.     ezetimibe-simvastatin (VYTORIN) 10-10 MG tablet Take 1 tablet by mouth at bedtime. 90 tablet 3   fluticasone (FLONASE) 50 MCG/ACT nasal spray Place 1 spray into both nostrils daily as needed for allergies.     gabapentin (NEURONTIN) 100 MG capsule Take 300 mg by mouth 3 (three) times daily.      ketoconazole (NIZORAL) 2 % cream Apply topically daily.     magnesium oxide (MAG-OX) 400 MG tablet Take 400 mg by mouth daily.     nitroGLYCERIN (NITROSTAT) 0.4 MG SL tablet DISSOLVE 1 TABLET(0.4 MG TOTAL) UNDER THE TONGUE EVERY 5 MINUTES FOR 3 DOSES AS NEEDED FOR CHEST PAIN 25 tablet 4   olmesartan (BENICAR) 20 MG tablet Take 20 mg by mouth daily.     Thiamine HCl (VITAMIN B-1) 250 MG tablet Take 250 mg by mouth daily.      tiZANidine (ZANAFLEX) 2 MG tablet Take by mouth.     traMADol (ULTRAM) 50 MG tablet Take 1-2 tablets (50-100 mg total) by mouth every 4 (four) hours as needed for moderate pain. (Patient taking differently: Take 100-200 mg by mouth See admin instructions. Take 200 mg by mouth in the morning and 100 mg at bedtime) 60 tablet 0   Turmeric Curcumin 500 MG CAPS Take 500 mg by mouth at bedtime.     vitamin C (ASCORBIC ACID) 500 MG tablet Take 500 mg by mouth daily.     zolpidem (AMBIEN) 5 MG tablet Take 5 mg by mouth at bedtime as needed.     omeprazole (PRILOSEC) 20 MG capsule Take 20 mg by mouth daily. (Patient not taking: Reported on 08/08/2021)     testosterone cypionate (DEPOTESTOTERONE CYPIONATE) 100 MG/ML  injection Inject 100 mg into the muscle every 21 ( twenty-one) days.  (Patient not taking: Reported on 08/08/2021)  0   No facility-administered medications prior to visit.   Final Medications at End of Visit    Current Meds  Medication Sig   acetaminophen (TYLENOL) 650 MG CR tablet Take 1,300 mg by mouth 2 (two) times daily.   aspirin EC 81 MG tablet Take 81 mg by mouth daily.   buPROPion (WELLBUTRIN XL) 150 MG 24 hr tablet Take 150 mg by mouth daily.   busPIRone  (BUSPAR) 15 MG tablet Take 1 tablet by mouth in the morning and at bedtime.   carvedilol (COREG) 6.25 MG tablet Take 1 tablet (6.25 mg total) by mouth 2 (two) times daily with a meal.   citalopram (CELEXA) 40 MG tablet Take 40 mg by mouth daily.   cyanocobalamin 1000 MCG tablet Take 1,000 mcg by mouth daily.    ergocalciferol (VITAMIN D2) 1.25 MG (50000 UT) capsule Take 50,000 Units by mouth once a week.   ezetimibe-simvastatin (VYTORIN) 10-10 MG tablet Take 1 tablet by mouth at bedtime.   fluticasone (FLONASE) 50 MCG/ACT nasal spray Place 1 spray into both nostrils daily as needed for allergies.   gabapentin (NEURONTIN) 100 MG capsule Take 300 mg by mouth 3 (three) times daily.    ketoconazole (NIZORAL) 2 % cream Apply topically daily.   magnesium oxide (MAG-OX) 400 MG tablet Take 400 mg by mouth daily.   nitroGLYCERIN (NITROSTAT) 0.4 MG SL tablet DISSOLVE 1 TABLET(0.4 MG TOTAL) UNDER THE TONGUE EVERY 5 MINUTES FOR 3 DOSES AS NEEDED FOR CHEST PAIN   olmesartan (BENICAR) 20 MG tablet Take 20 mg by mouth daily.   Thiamine HCl (VITAMIN B-1) 250 MG tablet Take 250 mg by mouth daily.    tiZANidine (ZANAFLEX) 2 MG tablet Take by mouth.   traMADol (ULTRAM) 50 MG tablet Take 1-2 tablets (50-100 mg total) by mouth every 4 (four) hours as needed for moderate pain. (Patient taking differently: Take 100-200 mg by mouth See admin instructions. Take 200 mg by mouth in the morning and 100 mg at bedtime)   Turmeric Curcumin 500 MG CAPS Take 500 mg by mouth at bedtime.   vitamin C (ASCORBIC ACID) 500 MG tablet Take 500 mg by mouth daily.   zolpidem (AMBIEN) 5 MG tablet Take 5 mg by mouth at bedtime as needed.    Radiology:  No results found.  Cardiac Studies:   Coronary angiogram 07/17/2014: Mid RCA with drug-eluting stent placement for MI and staged PCI 08/09/2014: Midcircumflex 4.0 x 28 mm, mid LAD 2.75 x 12 mm Promus Premier DES stent.  Echocardiogram 06/14/2020: Left ventricle cavity is normal in size  and wall thickness. Normal global wall motion. Normal LV systolic function with EF 57%. Doppler evidence of grade I (impaired) diastolic dysfunction, normal LAP.  No significant valvular abnormalities. Normal right atrial pressure. No significant change compared to previous study in 2016.  Lexiscan Tetrofosmin stress test 06/14/2020: Lexiscan/modified Bruce nuclear stress test performed using 1-day protocol.  Stress EKG is non-diagnostic, as this is pharmacological stress test. In addition, stress EKG at 80% MPHR showed sinus tachycardia, RBBB, LAFB, possible old inferior infarct.  Normal myocardial perfusion. Stress LVEF 67%.  ABI 04/11/2021:  This exam reveals normal perfusion of the right lower extremity (ABI 1.00)  and mildly decreased perfusion of the left lower extremity, noted at the  post tibial artery level (ABI 0.83). There is multiphasic waveform pattern  at the level of  the ankles.  There is dampened pressure waveform. Medial arterial sclerosis can give  falsely elevated ABI.   EKG  08/08/2021: Sinus rhythm at a rate of 94 bpm.  Left axis, left anterior fascicular block.  Right bundle branch block.  Bifascicular block.  Nonspecific T wave abnormality.  Low voltage complexes.  Compared EKG 02/05/2021, no significant change.  Assessment   1. Coronary artery disease of native artery of native heart with stable angina pectoris ([place])   2. Hypercholesteremia   3. HTN (hypertension), benign    No orders of the defined types were placed in this encounter.   There are no discontinued medications.    Recommendations:   HPI: Adam Lucas  is a 71 y.o. With coronary artery disease, myocardial infarction and angioplasty to RCA and circumflex in 2016, hyperrtension, mild hyperlipidemia, depression, degenerative joint disease, chronic back pain, statin intolerance.   Patient presents for 52-monthfollow-up of CAD, hypertension, hyperlipidemia.  Last office visit ordered ABI to  evaluate for PAD.  ABI revealed normal perfusion of the right leg and mildly reduced perfusion of the left leg.  Reviewed and discussed results of ABI, details above.  Patient remains fairly asymptomatic from a cardiovascular standpoint.  He does continue to have leg pain consistent with musculoskeletal etiology.  I personally reviewed external labs, lipids are well controlled.  Patient blood pressure was initially elevated in the office today, however it improved upon recheck.  Advised patient to monitor blood pressure regularly at home and notify our office if it remains >130/80 mmHg on average.  Patient does have underlying diastolic dysfunction which likely contributes to his dyspnea on exertion.  Patient's dyspnea on exertion is stable.  No medications were made at today's office visit patient is otherwise stable from a cardiovascular standpoint.  Follow-up in 6 months, sooner if needed.    CAlethia Berthold PA-C 08/08/2021, 10:31 AM Office: [phone]

## 2022-01-08 ENCOUNTER — Encounter: Payer: Self-pay | Admitting: Student

## 2022-01-08 ENCOUNTER — Ambulatory Visit: Payer: Medicare Other | Admitting: Student

## 2022-01-08 VITALS — BP 145/85 | HR 69 | Temp 97.8°F | Resp 17 | Ht 66.0 in | Wt 193.0 lb

## 2022-01-08 DIAGNOSIS — E7800 Pure hypercholesterolemia, unspecified: Secondary | ICD-10-CM

## 2022-01-08 DIAGNOSIS — I10 Essential (primary) hypertension: Secondary | ICD-10-CM

## 2022-01-08 DIAGNOSIS — I25118 Atherosclerotic heart disease of native coronary artery with other forms of angina pectoris: Secondary | ICD-10-CM

## 2022-01-08 NOTE — Progress Notes (Signed)
Primary Physician/Referring:  Tracie Harrier, MD  Patient ID: Adam Lucas, male    DOB: April 11, 1951, 71 y.o.   MRN: [id number]  Chief Complaint  Patient presents with   Coronary Artery Disease   Hypertension   Hyperlipidemia   Follow-up    6 month    HPI: Adam Lucas  is a 71 y.o. male  With coronary artery disease, myocardial infarction and angioplasty to RCA and circumflex in 2016, hyperrtension, mild hyperlipidemia, depression, degenerative joint disease, chronic back pain, statin intolerance.   Patient was last seen in the office 08/08/2021 at which time patient was stable from a cardiovascular standpoint and no changes were made.  He now presents for 32-monthfollow-up.  Patient continues to feel well without specific complaints.  He remains active redoing his house, [place]  Denies chest pain, dyspnea, palpitations, leg edema.  He does continue to struggle with bilateral hip pain as well as sciatica and neurogenic claudication, all of which remained stable.  Past Medical History:  Diagnosis Date   Carpal tunnel syndrome    Chronic lower back pain    Coronary artery disease    Degenerative arthritis    Heart murmur ~ 1958   High cholesterol    History of kidney stones    Hypertension    Hypogonadism in male    Kidney stones    Migraine    "once q year or 2" (08/09/2014)   Myocardial infarction ([facility] 07/17/2014   Neuropathy    Seasonal allergies    Past Surgical History:  Procedure Laterality Date   BACK SURGERY     fusion levels l 3,4,5 s1   CARPAL TUNNEL RELEASE Right 2015   CARPAL TUNNEL RELEASE Left 06/23/2019   Procedure: CARPAL TUNNEL RELEASE;  Surgeon: HDereck Leep MD;  Location: [facility];  Service: Orthopedics;  Laterality: Left;   COLONOSCOPY WITH PROPOFOL N/A 11/02/2015   Procedure: COLONOSCOPY WITH PROPOFOL;  Surgeon: RManya Silvas MD;  [facility];  Service: Endoscopy;  Laterality: N/A;    CORONARY ANGIOGRAM  08/09/2014   Procedure: CORONARY ANGIOGRAM;  Surgeon: JLaverda Page MD;  [facility];  Service: Cardiovascular;;   CORONARY ANGIOPLASTY WITH STENT PLACEMENT  07/17/2014; 08/09/2014   "1; 2"   EXTRACORPOREAL SHOCK WAVE LITHOTRIPSY  2000's X 1   FRACTURE SURGERY     HAND LIGAMENT RECONSTRUCTION Right ~ 2013   "thumb"   HIP SURGERY Left    10/19/17   JOINT REPLACEMENT Left    hip   KNEE ARTHROSCOPY Left ~ 2002   LEFT HEART CATHETERIZATION WITH CORONARY ANGIOGRAM N/A 07/17/2014   Procedure: LEFT HEART CATHETERIZATION WITH CORONARY ANGIOGRAM;  Surgeon: JLaverda Page MD;  [facility];  Service: Cardiovascular;  Laterality: N/A;   ORIF FINGER / THUMB FRACTURE Right ~ 2012   PERCUTANEOUS CORONARY STENT INTERVENTION (PCI-S)  08/09/2014   Procedure: PERCUTANEOUS CORONARY STENT INTERVENTION (PCI-S);  Surgeon: JLaverda Page MD;  [facility];  Service: Cardiovascular;;  mid circ promus [phone] mid LAD promus 2.75x12   POSTERIOR LUMBAR FUSION 4 LEVEL  2005   "L3, 4, 5; S1"   TONSILLECTOMY AND ADENOIDECTOMY  1970'S   TOTAL HIP ARTHROPLASTY Left 10/20/2017   Procedure: TOTAL HIP ARTHROPLASTY;  Surgeon: HDereck Leep MD;  Location: [facility];  Service: Orthopedics;  Laterality: Left;   Family History  Problem Relation Age of Onset   Deep vein thrombosis  Mother    Parkinson's disease Father    Heart attack Father    Social History   Tobacco Use   Smoking status: Never   Smokeless tobacco: Never  Substance Use Topics   Alcohol use: Not Currently    Comment: 08/09/2014 "I'll have a drink 1-2 times year"   Marital Status: Married   Review of Systems  Constitutional: Negative for malaise/fatigue.  Cardiovascular:  Positive for claudication (neurogenic). Negative for chest pain, dyspnea on exertion, leg swelling, near-syncope, orthopnea, palpitations, paroxysmal nocturnal dyspnea and syncope.  Musculoskeletal:  Positive for back pain  (chronic) and joint pain (chronic DJD).  All other systems reviewed and are negative.   Objective  Blood pressure (!) 145/85, pulse 69, temperature 97.8 F (36.6 C), resp. rate 17, height 5' 6" (1.676 m), weight 193 lb (87.5 kg), SpO2 96 %. Body mass index is 31.15 kg/m.    01/08/2022    9:03 AM 01/08/2022    8:58 AM 08/08/2021    9:58 AM  Vitals with BMI  Height  5' 6"   Weight  193 lbs   BMI  16.10   Systolic 960 454 098  Diastolic 85 88 74  Pulse 69 71 94   Physical Exam Vitals reviewed.  Constitutional:      Comments:  He is well-built and well-nourished, mildly obese in no acute distress.  Neck:     Thyroid: No thyromegaly.     Vascular: No carotid bruit or JVD.  Cardiovascular:     Rate and Rhythm: Normal rate and regular rhythm.     Pulses: Intact distal pulses.          Popliteal pulses are 2+ on the right side and 2+ on the left side.       Dorsalis pedis pulses are 1+ on the right side and 1+ on the left side.       Posterior tibial pulses are 0 on the right side and 0 on the left side.     Heart sounds: Normal heart sounds, S1 normal and S2 normal. No murmur heard.    No gallop.     Comments: No JVD Pulmonary:     Effort: Pulmonary effort is normal. No respiratory distress.     Breath sounds: Normal breath sounds. No wheezing, rhonchi or rales.  Musculoskeletal:     Right lower leg: No edema.     Left lower leg: No edema.  Neurological:     Mental Status: He is alert.   Physical exam remains unchanged compared to previous office visit.  Laboratory examination:      Latest Ref Rng & Units 10/08/2017    1:34 PM 08/10/2014    2:43 AM 07/18/2014    6:26 AM  CMP  Glucose 65 - 99 mg/dL 101  128  118   BUN 6 - 20 mg/dL _0 Creatinine 0.61 - 1.24 mg/dL 1.05  1.12  1.13   Sodium 135 - 145 mmol/L 136  138  136   Potassium 3.5 - 5.1 mmol/L 3.7  4.2  3.9   Chloride 101 - 111 mmol/L 104  105  105   CO2 22 - 32 mmol/L _1 Calcium 8.9 - 10.3 mg/dL  9.2  8.8  8.9   Total Protein 6.5 - 8.1 g/dL 7.8     Total Bilirubin 0.3 - 1.2 mg/dL 0.4     Alkaline Phos 38 - 126 U/L 62  AST 15 - 41 U/L 21     ALT 17 - 63 U/L 19         Latest Ref Rng & Units 10/08/2017    1:34 PM 08/10/2014    2:43 AM 07/18/2014    6:26 AM  CBC  WBC 3.8 - 10.6 K/uL 13.7  7.4  14.0   Hemoglobin 13.0 - 18.0 g/dL 15.6  12.8  13.0   Hematocrit 40.0 - 52.0 % 48.3  40.7  40.6   Platelets 150 - 440 K/uL 237  222  207    Lipid Panel No results for input(s): "CHOL", "TRIG", "LDLCALC", "VLDL", "HDL", "CHOLHDL", "LDLDIRECT" in the last 8760 hours.   External labs: 05/14/2021: TSH 2.196 Total cholesterol 130, triglycerides 114, HDL 41, LDL 66 Sodium 140, potassium 4.4, BUN 19, creatinine 1.1, GFR >60 Hgb 16, HCT 50.4, MCV 85.6, platelet 215  01/03/2021 Hgb 15.6, HCT 48.2, MCV 84.6, platelets 196, TSH 1.819 Total cholesterol 116, triglycerides 97, HDL 35, LDL 61 Sodium 139, potassium 4.6, BUN 19, creatinine 1.1, GFR >60  Labs 04/19/2020:  Serum glucose 93 sodium 139, potassium 4.1, BUN 20, creatinine 1.2, EGFR 60 mL, CMP otherwise normal.  Total cholesterol 185, triglycerides 171, HDL 35, LDL 116.  TSH 3.141.  Allergies   Allergies  Allergen Reactions   Pravastatin Other (See Comments)    Myalgia   Atorvastatin Other (See Comments)    Muscle/leg pain.   Crestor [Rosuvastatin Calcium] Other (See Comments)    Muscle pain    Medications Prior to Visit:   Outpatient Medications Prior to Visit  Medication Sig Dispense Refill   acetaminophen (TYLENOL) 650 MG CR tablet Take 1,300 mg by mouth 2 (two) times daily.     aspirin EC 81 MG tablet Take 81 mg by mouth daily.     buPROPion (WELLBUTRIN XL) 150 MG 24 hr tablet Take 150 mg by mouth daily.     busPIRone (BUSPAR) 15 MG tablet Take 1 tablet by mouth in the morning and at bedtime.     carvedilol (COREG) 6.25 MG tablet Take 1 tablet (6.25 mg total) by mouth 2 (two) times daily with a meal. 60 tablet 1    citalopram (CELEXA) 40 MG tablet Take 40 mg by mouth daily.     cyanocobalamin 1000 MCG tablet Take 1,000 mcg by mouth daily.      ergocalciferol (VITAMIN D2) 1.25 MG (50000 UT) capsule Take 50,000 Units by mouth once a week.     ezetimibe-simvastatin (VYTORIN) 10-10 MG tablet Take 1 tablet by mouth at bedtime. 90 tablet 3   fluticasone (FLONASE) 50 MCG/ACT nasal spray Place 1 spray into both nostrils daily as needed for allergies.     ketoconazole (NIZORAL) 2 % cream Apply topically daily.     magnesium oxide (MAG-OX) 400 MG tablet Take 400 mg by mouth daily.     nitroGLYCERIN (NITROSTAT) 0.4 MG SL tablet DISSOLVE 1 TABLET(0.4 MG TOTAL) UNDER THE TONGUE EVERY 5 MINUTES FOR 3 DOSES AS NEEDED FOR CHEST PAIN 25 tablet 4   olmesartan (BENICAR) 20 MG tablet Take 20 mg by mouth daily.     omeprazole (PRILOSEC) 20 MG capsule Take 20 mg by mouth daily.     testosterone cypionate (DEPOTESTOTERONE CYPIONATE) 100 MG/ML injection Inject into the muscle.     Thiamine HCl (VITAMIN B-1) 250 MG tablet Take 250 mg by mouth daily.      tiZANidine (ZANAFLEX) 2 MG tablet Take by mouth.     traMADol Veatrice Bourbon)  50 MG tablet Take 1-2 tablets (50-100 mg total) by mouth every 4 (four) hours as needed for moderate pain. (Patient taking differently: Take 100-200 mg by mouth See admin instructions. Take 200 mg by mouth in the morning and 100 mg at bedtime) 60 tablet 0   Turmeric Curcumin 500 MG CAPS Take 500 mg by mouth at bedtime.     vitamin C (ASCORBIC ACID) 500 MG tablet Take 500 mg by mouth daily.     zolpidem (AMBIEN) 5 MG tablet Take 5 mg by mouth at bedtime as needed.     gabapentin (NEURONTIN) 100 MG capsule Take 300 mg by mouth 3 (three) times daily.      No facility-administered medications prior to visit.   Final Medications at End of Visit    Current Meds  Medication Sig   acetaminophen (TYLENOL) 650 MG CR tablet Take 1,300 mg by mouth 2 (two) times daily.   aspirin EC 81 MG tablet Take 81 mg by mouth  daily.   buPROPion (WELLBUTRIN XL) 150 MG 24 hr tablet Take 150 mg by mouth daily.   busPIRone (BUSPAR) 15 MG tablet Take 1 tablet by mouth in the morning and at bedtime.   carvedilol (COREG) 6.25 MG tablet Take 1 tablet (6.25 mg total) by mouth 2 (two) times daily with a meal.   citalopram (CELEXA) 40 MG tablet Take 40 mg by mouth daily.   cyanocobalamin 1000 MCG tablet Take 1,000 mcg by mouth daily.    ergocalciferol (VITAMIN D2) 1.25 MG (50000 UT) capsule Take 50,000 Units by mouth once a week.   ezetimibe-simvastatin (VYTORIN) 10-10 MG tablet Take 1 tablet by mouth at bedtime.   fluticasone (FLONASE) 50 MCG/ACT nasal spray Place 1 spray into both nostrils daily as needed for allergies.   ketoconazole (NIZORAL) 2 % cream Apply topically daily.   magnesium oxide (MAG-OX) 400 MG tablet Take 400 mg by mouth daily.   nitroGLYCERIN (NITROSTAT) 0.4 MG SL tablet DISSOLVE 1 TABLET(0.4 MG TOTAL) UNDER THE TONGUE EVERY 5 MINUTES FOR 3 DOSES AS NEEDED FOR CHEST PAIN   olmesartan (BENICAR) 20 MG tablet Take 20 mg by mouth daily.   omeprazole (PRILOSEC) 20 MG capsule Take 20 mg by mouth daily.   testosterone cypionate (DEPOTESTOTERONE CYPIONATE) 100 MG/ML injection Inject into the muscle.   Thiamine HCl (VITAMIN B-1) 250 MG tablet Take 250 mg by mouth daily.    tiZANidine (ZANAFLEX) 2 MG tablet Take by mouth.   traMADol (ULTRAM) 50 MG tablet Take 1-2 tablets (50-100 mg total) by mouth every 4 (four) hours as needed for moderate pain. (Patient taking differently: Take 100-200 mg by mouth See admin instructions. Take 200 mg by mouth in the morning and 100 mg at bedtime)   Turmeric Curcumin 500 MG CAPS Take 500 mg by mouth at bedtime.   vitamin C (ASCORBIC ACID) 500 MG tablet Take 500 mg by mouth daily.   zolpidem (AMBIEN) 5 MG tablet Take 5 mg by mouth at bedtime as needed.    Radiology:  No results found.  Cardiac Studies:   Coronary angiogram 07/17/2014: Mid RCA with drug-eluting stent placement for  MI and staged PCI 08/09/2014: Midcircumflex 4.0 x 28 mm, mid LAD 2.75 x 12 mm Promus Premier DES stent.  Echocardiogram 06/14/2020: Left ventricle cavity is normal in size and wall thickness. Normal global wall motion. Normal LV systolic function with EF 57%. Doppler evidence of grade I (impaired) diastolic dysfunction, normal LAP.  No significant valvular abnormalities. Normal right atrial  pressure. No significant change compared to previous study in 2016.  Lexiscan Tetrofosmin stress test 06/14/2020: Lexiscan/modified Bruce nuclear stress test performed using 1-day protocol.  Stress EKG is non-diagnostic, as this is pharmacological stress test. In addition, stress EKG at 80% MPHR showed sinus tachycardia, RBBB, LAFB, possible old inferior infarct.  Normal myocardial perfusion. Stress LVEF 67%.  ABI 04/11/2021:  This exam reveals normal perfusion of the right lower extremity (ABI 1.00)  and mildly decreased perfusion of the left lower extremity, noted at the  post tibial artery level (ABI 0.83). There is multiphasic waveform pattern  at the level of the ankles.  There is dampened pressure waveform. Medial arterial sclerosis can give  falsely elevated ABI.   EKG  01/08/2022: Sinus rhythm at a rate of 70 bpm.  Left axis, left anterior fascicular block.  Right bundle branch block.  Nonspecific T wave abnormality.  Compared EKG 08/08/2021, no significant change.  08/08/2021: Sinus rhythm at a rate of 94 bpm.  Left axis, left anterior fascicular block.  Right bundle branch block.  Bifascicular block.  Nonspecific T wave abnormality.  Low voltage complexes.  Compared EKG 02/05/2021, no significant change.  Assessment   1. Coronary artery disease of native artery of native heart with stable angina pectoris ([place])   2. HTN (hypertension), benign   3. Hypercholesteremia    No orders of the defined types were placed in this encounter.   Medications Discontinued During This Encounter  Medication  Reason   gabapentin (NEURONTIN) 100 MG capsule       Recommendations:   HPI: Adam Lucas  is a 72 y.o. With coronary artery disease, myocardial infarction and angioplasty to RCA and circumflex in 2016, hyperrtension, mild hyperlipidemia, depression, degenerative joint disease, chronic back pain, statin intolerance.   Patient was last seen in the office 08/08/2021 at which time patient was stable from a cardiovascular standpoint and no changes were made.  He now presents for 61-monthfollow-up.  Patient remains asymptomatic from a cardiovascular standpoint.  Blood pressure is mildly elevated in the office today, however it is typically well controlled.  Advised patient to monitor blood pressure daily at home and notify our office if it is on average >130/80 mmHg, patient agrees.  No changes are made at today's office visit.  EKG and physical exam are stable.  Follow-up in 6 months, sooner if needed.   CAlethia Berthold PA-C 01/08/2022, 9:54 AM Office: [phone]

## 2022-02-05 ENCOUNTER — Ambulatory Visit: Payer: Medicare Other | Admitting: Student

## 2022-07-02 ENCOUNTER — Ambulatory Visit: Payer: Medicare Other | Admitting: Cardiology

## 2022-07-02 ENCOUNTER — Encounter: Payer: Self-pay | Admitting: Cardiology

## 2022-07-02 VITALS — BP 134/71 | HR 78 | Resp 16 | Ht 66.0 in | Wt 199.4 lb

## 2022-07-02 DIAGNOSIS — I25118 Atherosclerotic heart disease of native coronary artery with other forms of angina pectoris: Secondary | ICD-10-CM

## 2022-07-02 DIAGNOSIS — I10 Essential (primary) hypertension: Secondary | ICD-10-CM

## 2022-07-02 DIAGNOSIS — E7800 Pure hypercholesterolemia, unspecified: Secondary | ICD-10-CM

## 2022-07-02 NOTE — Progress Notes (Signed)
Primary Physician/Referring:  Tracie Harrier, MD  Patient ID: Adam Lucas, male    DOB: 1951-06-15, 72 y.o.   MRN: [id number]  Chief Complaint  Patient presents with  . Coronary Artery Disease  . Hypertension  . Hyperlipidemia  . Follow-up    6 months    HPI: Adam Lucas  is a 72 y.o. male  with coronary artery disease, myocardial infarction and angioplasty to RCA and circumflex in 2016, hypertension,  hyperlipidemia, depression, degenerative joint disease, chronic back pain, spinal stenosis and neurogenic claudication, statin intolerance and presently tolerating Vytorin 10/10 mg daily.   This is a 82-monthoffice visit.  About a month ago Adam Lucas started noticing mild discomfort in the middle of the chest which she states is easily relieved with rest, Adam Lucas has had about 2 episodes or so.  Adam Lucas has not used any sublingual nitroglycerin.  Described as very mild.  Adam Lucas continues to remain fairly active.  Adam Lucas continues to have dysesthesia both in his thighs and the back of his thigh with activity.  Past Medical History:  Diagnosis Date  . Carpal tunnel syndrome   . Chronic lower back pain   . Coronary artery disease   . Degenerative arthritis   . Heart murmur ~ 1958  . High cholesterol   . History of kidney stones   . Hypertension   . Hypogonadism in male   . Kidney stones   . Migraine    "once q year or 2" (08/09/2014)  . [place]  . Neuropathy   . Seasonal allergies    Past Surgical History:  Procedure Laterality Date  . BACK SURGERY     fusion levels l 3,4,5 s1  . CARPAL TUNNEL RELEASE Right 2015  . CARPAL TUNNEL RELEASE Left 06/23/2019   Procedure: CARPAL TUNNEL RELEASE;  Surgeon: HDereck Leep MD;  Location: [facility] ORS;  Service: Orthopedics;  Laterality: Left;  . COLONOSCOPY WITH PROPOFOL N/A 11/02/2015   Procedure: COLONOSCOPY WITH PROPOFOL;  Surgeon: RManya Silvas MD;  [facility];  Service: Endoscopy;  Laterality: N/A;  .  CORONARY ANGIOGRAM  08/09/2014   Procedure: CORONARY ANGIOGRAM;  Surgeon: JLaverda Page MD;  [facility];  Service: Cardiovascular;;  . CORONARY ANGIOPLASTY WITH STENT PLACEMENT  07/17/2014; 08/09/2014   "1; 2"  . EXTRACORPOREAL SHOCK WAVE LITHOTRIPSY  2000's X 1  . FRACTURE SURGERY    . HAND LIGAMENT RECONSTRUCTION Right ~ 2013   "thumb"  . HIP SURGERY Left    10/19/17  . JOINT REPLACEMENT Left    hip  . KNEE ARTHROSCOPY Left ~ 2002  . LEFT HEART CATHETERIZATION WITH CORONARY ANGIOGRAM N/A 07/17/2014   Procedure: LEFT HEART CATHETERIZATION WITH CORONARY ANGIOGRAM;  Surgeon: JLaverda Page MD;  [facility];  Service: Cardiovascular;  Laterality: N/A;  . ORIF FINGER / THUMB FRACTURE Right ~ 2012  . PERCUTANEOUS CORONARY STENT INTERVENTION (PCI-S)  08/09/2014   Procedure: PERCUTANEOUS CORONARY STENT INTERVENTION (PCI-S);  Surgeon: JLaverda Page MD;  [facility];  Service: Cardiovascular;;  mid circ promus 4x28 mid LAD promus 2.75x12  . POSTERIOR LUMBAR FUSION 4 LEVEL  2005   "L3, 4, 5; S1"  . TONSILLECTOMY AND ADENOIDECTOMY  1970'S  . TOTAL HIP ARTHROPLASTY Left 10/20/2017   Procedure: TOTAL HIP ARTHROPLASTY;  Surgeon: HDereck Leep MD;  Location: [facility] ORS;  Service: Orthopedics;  Laterality: Left;   Family History  Problem Relation Age of Onset  .  Deep vein thrombosis Mother   . Parkinson's disease Father   . Heart attack Father    Social History   Tobacco Use  . Smoking status: Never  . Smokeless tobacco: Never  Substance Use Topics  . Alcohol use: Not Currently    Comment: 08/09/2014 "I'll have a drink 1-2 times year"   Marital Status: Married   Review of Systems  Cardiovascular:  Positive for chest pain. Negative for claudication, dyspnea on exertion and leg swelling.  Neurological:  Positive for paresthesias.    Objective  Blood pressure 134/71, pulse 78, resp. rate 16, height '5\' 6"'$  (1.676 m), weight 199 lb 6.4 oz (90.4 kg), SpO2  95 %. Body mass index is 32.18 kg/m.    07/02/2022    1:55 PM 01/08/2022    9:03 AM 01/08/2022    8:58 AM  Vitals with BMI  Height '5\' 6"'$   '5\' 6"'$   Weight 199 lbs 6 oz  193 lbs  BMI 55.7  32.20  Systolic 254 270 623  Diastolic 71 85 88  Pulse 78 69 71   Physical Exam Vitals reviewed.  Constitutional:      Comments:  Adam Lucas is well-built and well-nourished, mildly obese in no acute distress.  Neck:     Thyroid: No thyromegaly.     Vascular: No carotid bruit or JVD.  Cardiovascular:     Rate and Rhythm: Normal rate and regular rhythm.     Pulses: Normal pulses and intact distal pulses.     Heart sounds: Normal heart sounds, S1 normal and S2 normal. No murmur heard.    No gallop.  Pulmonary:     Effort: Pulmonary effort is normal.     Breath sounds: Normal breath sounds.  Abdominal:     General: Bowel sounds are normal.     Palpations: Abdomen is soft.  Musculoskeletal:     Right lower leg: No edema.     Left lower leg: No edema.    Laboratory examination:  External labs:  Labs 06/25/2022:  Hb 15.0/HCT 47.2, platelets 291.  Serum glucose 86 mg, sodium 141, potassium 4.4, BUN 23, creatinine 1.3, EGFR 58 mL, LFTs normal.  Total cholesterol 180, triglycerides 71, HDL 40.2, LDL 64.  Urinary albumin/creatinine ratio 31.5, minimally elevated.  TSH normal at 2.554.  Allergies   Allergies  Allergen Reactions  . Pravastatin Other (See Comments)    Myalgia  . Atorvastatin Other (See Comments)    Muscle/leg pain.  Alveta Heimlich [Rosuvastatin Calcium] Other (See Comments)    Muscle pain     Medications    Current Outpatient Medications:  .  acetaminophen (TYLENOL) 650 MG CR tablet, Take 1,300 mg by mouth 2 (two) times daily., Disp: , Rfl:  .  aspirin EC 81 MG tablet, Take 81 mg by mouth daily., Disp: , Rfl:  .  carvedilol (COREG) 6.25 MG tablet, Take 1 tablet (6.25 mg total) by mouth 2 (two) times daily with a meal., Disp: 60 tablet, Rfl: 1 .  citalopram (CELEXA) 40 MG  tablet, Take 40 mg by mouth daily., Disp: , Rfl:  .  cyanocobalamin 1000 MCG tablet, Take 1,000 mcg by mouth daily. , Disp: , Rfl:  .  ergocalciferol (VITAMIN D2) 1.25 MG (50000 UT) capsule, Take 50,000 Units by mouth once a week., Disp: , Rfl:  .  ezetimibe-simvastatin (VYTORIN) 10-10 MG tablet, Take 1 tablet by mouth at bedtime., Disp: 90 tablet, Rfl: 3 .  fluticasone (FLONASE) 50 MCG/ACT nasal spray, Place 1 spray into  both nostrils daily as needed for allergies., Disp: , Rfl:  .  magnesium oxide (MAG-OX) 400 MG tablet, Take 400 mg by mouth daily., Disp: , Rfl:  .  nitroGLYCERIN (NITROSTAT) 0.4 MG SL tablet, DISSOLVE 1 TABLET(0.4 MG TOTAL) UNDER THE TONGUE EVERY 5 MINUTES FOR 3 DOSES AS NEEDED FOR CHEST PAIN, Disp: 25 tablet, Rfl: 4 .  olmesartan (BENICAR) 20 MG tablet, Take 20 mg by mouth daily., Disp: , Rfl:  .  testosterone cypionate (DEPOTESTOTERONE CYPIONATE) 100 MG/ML injection, Inject into the muscle., Disp: , Rfl:  .  Thiamine HCl (VITAMIN B-1) 250 MG tablet, Take 250 mg by mouth daily. , Disp: , Rfl:  .  traMADol (ULTRAM) 50 MG tablet, Take 1-2 tablets (50-100 mg total) by mouth every 4 (four) hours as needed for moderate pain. (Patient taking differently: Take 100-200 mg by mouth See admin instructions. Take 200 mg by mouth in the morning and 100 mg at bedtime), Disp: 60 tablet, Rfl: 0 .  Turmeric Curcumin 500 MG CAPS, Take 500 mg by mouth at bedtime., Disp: , Rfl:  .  vitamin C (ASCORBIC ACID) 500 MG tablet, Take 500 mg by mouth daily., Disp: , Rfl:  .  zolpidem (AMBIEN) 5 MG tablet, Take 5 mg by mouth at bedtime as needed., Disp: , Rfl:    Radiology:  No results found.  Cardiac Studies:   Coronary angiogram 07/17/2014: Mid RCA with drug-eluting stent placement for MI and staged PCI 08/09/2014: Midcircumflex 4.0 x 28 mm, mid LAD 2.75 x 12 mm Promus Premier DES stent.  Echocardiogram 06/14/2020: Left ventricle cavity is normal in size and wall thickness. Normal global wall motion.  Normal LV systolic function with EF 57%. Doppler evidence of grade I (impaired) diastolic dysfunction, normal LAP.  No significant valvular abnormalities. Normal right atrial pressure. No significant change compared to previous study in 2016.  Lexiscan Tetrofosmin stress test 06/14/2020: Lexiscan/modified Bruce nuclear stress test performed using 1-day protocol.  Stress EKG is non-diagnostic, as this is pharmacological stress test. In addition, stress EKG at 80% MPHR showed sinus tachycardia, RBBB, LAFB, possible old inferior infarct.  Normal myocardial perfusion. Stress LVEF 67%.  ABI 04/11/2021:  This exam reveals normal perfusion of the right lower extremity (ABI 1.00).  Mildly decreased perfusion of the left lower extremity, noted at the  post tibial artery level (ABI 0.83). There is multiphasic waveform pattern  at the level of the ankles.  There is dampened pressure waveform. Medial arterial sclerosis can give falsely elevated ABI.   EKG   EKG 07/02/2022: Normal sinus rhythm with rate of 78 bpm, left atrial enlargement, left axis deviation, left anterior fascicular block.  Cannot exclude inferior infarct old.  Right bundle branch block.  Nonspecific T abnormality.  Compared to 01/08/2022, no significant change.   Assessment   1. Coronary artery disease of native artery of native heart with stable angina pectoris ([place])   2. HTN (hypertension), benign   3. Hypercholesteremia    No orders of the defined types were placed in this encounter.   Medications Discontinued During This Encounter  Medication Reason  . buPROPion (WELLBUTRIN XL) 150 MG 24 hr tablet   . omeprazole (PRILOSEC) 20 MG capsule   . ketoconazole (NIZORAL) 2 % cream   . busPIRone (BUSPAR) 15 MG tablet    Recommendations:   HPI: Adam Lucas  is a 72 y.o. with coronary artery disease, myocardial infarction and angioplasty to RCA and circumflex in 2016, hypertension,  hyperlipidemia, depression, degenerative joint  disease, chronic back pain, spinal stenosis and neurogenic claudication, statin intolerance and presently tolerating Vytorin 10/10 mg daily.   1. Coronary artery disease of native artery of native heart with stable angina pectoris [facility]) Patient has had very mild discomfort in his chest relieved with rest, suggestive of angina pectoris, however Adam Lucas has not used any sublingual nitroglycerin.  Advised him to use sublingual nitroglycerin and Adam Lucas will contact me if Adam Lucas has increased frequency of use or chest pain is coming on frequently, chest discomfort started about a month ago.  Adam Lucas has had about 1 or 2 episodes easily relieved with rest.  His last tress test were about 3 to 4 years ago which was low risk.  2. HTN (hypertension), benign Blood pressure is well-controlled.  Adam Lucas had a question regarding carvedilol and Benicar, advised him that in view of coronary artery disease, angina pectoris, vascular disease, Adam Lucas should be on both and will continue the same for now.  Renal function has remained stable at stage IIIa chronic kidney disease.  3. Hypercholesteremia Patient has intolerance to many of the statins however has been able to tolerate Vytorin at a low dose and lipids are under excellent control.  Hence continue the same for now.  No changes in the medications were done today.  I reviewed his external labs, compared the EKG and I will like to see him back on annual basis unless chest pain symptoms get worse Adam Lucas is advised to contact me sooner.  With regard to symptoms of claudication, Adam Lucas has pseudoclaudication from neuropathic pain.  Adam Lucas is vascular examination is normal on physical exam.   [name], MD, [facility] 07/02/2022, 3:05 PM Office: [phone] Fax: [phone] Pager: [phone]

## 2022-07-03 ENCOUNTER — Ambulatory Visit: Payer: Medicare Other | Admitting: Cardiology

## 2022-07-15 ENCOUNTER — Ambulatory Visit: Payer: BC Managed Care – PPO | Admitting: Cardiology

## 2022-10-05 ENCOUNTER — Other Ambulatory Visit: Payer: Self-pay

## 2022-10-05 ENCOUNTER — Emergency Department: Payer: Medicare Other

## 2022-10-05 ENCOUNTER — Emergency Department
Admission: EM | Admit: 2022-10-05 | Discharge: 2022-10-05 | Disposition: A | Discharge status: Home / Self Care | Payer: Medicare Other | Attending: Emergency Medicine | Admitting: Emergency Medicine

## 2022-10-05 DIAGNOSIS — I2510 Atherosclerotic heart disease of native coronary artery without angina pectoris: Secondary | ICD-10-CM | POA: Diagnosis not present

## 2022-10-05 DIAGNOSIS — R209 Unspecified disturbances of skin sensation: Secondary | ICD-10-CM | POA: Insufficient documentation

## 2022-10-05 DIAGNOSIS — G43809 Other migraine, not intractable, without status migrainosus: Secondary | ICD-10-CM | POA: Insufficient documentation

## 2022-10-05 DIAGNOSIS — R519 Headache, unspecified: Secondary | ICD-10-CM | POA: Diagnosis present

## 2022-10-05 LAB — COMPREHENSIVE METABOLIC PANEL
ALT: 26 U/L (ref 0–44)
AST: 21 U/L (ref 15–41)
Albumin: 3.9 g/dL (ref 3.5–5.0)
Alkaline Phosphatase: 44 U/L (ref 38–126)
Anion gap: 7 (ref 5–15)
BUN: 26 mg/dL — ABNORMAL HIGH (ref 8–23)
CO2: 26 mmol/L (ref 22–32)
Calcium: 9.4 mg/dL (ref 8.9–10.3)
Chloride: 105 mmol/L (ref 98–111)
Creatinine, Ser: 1.2 mg/dL (ref 0.61–1.24)
GFR, Estimated: >60 mL/min (ref >60)
Glucose, Bld: 99 mg/dL (ref 70–99)
Potassium: 4.5 mmol/L (ref 3.5–5.1)
Sodium: 138 mmol/L (ref 135–145)
Total Bilirubin: 0.5 mg/dL (ref 0.3–1.2)
Total Protein: 6.7 g/dL (ref 6.5–8.1)

## 2022-10-05 LAB — DIFFERENTIAL
Abs Immature Granulocytes: 0.02 10*3/uL (ref 0.00–0.07)
Basophils Absolute: 0.1 10*3/uL (ref 0.0–0.1)
Basophils Relative: 1 %
Eosinophils Absolute: 0.2 10*3/uL (ref 0.0–0.5)
Eosinophils Relative: 2 %
Immature Granulocytes: 0 %
Lymphocytes Relative: 30 %
Lymphs Abs: 2.3 10*3/uL (ref 0.7–4.0)
Monocytes Absolute: 0.8 10*3/uL (ref 0.1–1.0)
Monocytes Relative: 10 %
Neutro Abs: 4.3 10*3/uL (ref 1.7–7.7)
Neutrophils Relative %: 57 %

## 2022-10-05 LAB — CBC
HCT: 45.9 % (ref 39.0–52.0)
Hemoglobin: 14.6 g/dL (ref 13.0–17.0)
MCH: 26.7 pg (ref 26.0–34.0)
MCHC: 31.8 g/dL (ref 30.0–36.0)
MCV: 84.1 fL (ref 80.0–100.0)
Platelets: 201 10*3/uL (ref 150–400)
RBC: 5.46 MIL/uL (ref 4.22–5.81)
RDW: 15.9 % — ABNORMAL HIGH (ref 11.5–15.5)
WBC: 7.6 10*3/uL (ref 4.0–10.5)
nRBC: 0 % (ref 0.0–0.2)

## 2022-10-05 LAB — APTT: aPTT: 30 seconds (ref 24–36)

## 2022-10-05 LAB — ETHANOL: Alcohol, Ethyl (B): <10 mg/dL (ref <10)

## 2022-10-05 LAB — PROTIME-INR
INR: 1.1 (ref 0.8–1.2)
Prothrombin Time: 13.9 seconds (ref 11.4–15.2)

## 2022-10-05 MED ORDER — ACETAMINOPHEN 500 MG PO TABS
1000.0000 mg | ORAL_TABLET | Freq: Once | ORAL | Status: AC
  Administered 2022-10-05: 1000 mg via ORAL
  Filled 2022-10-05: qty 2

## 2022-10-05 MED ORDER — SODIUM CHLORIDE 0.9% FLUSH: 3.0000 mL | Freq: Once | INTRAVENOUS | Status: DC

## 2022-10-05 MED ORDER — SODIUM CHLORIDE 0.9 % IV BOLUS
500.0000 mL | Freq: Once | INTRAVENOUS | Status: AC
  Administered 2022-10-05: 500 mL via INTRAVENOUS

## 2022-10-05 MED ORDER — METOCLOPRAMIDE HCL 5 MG/ML IJ SOLN
10.0000 mg | Freq: Once | INTRAMUSCULAR | Status: AC
  Administered 2022-10-05: 10 mg via INTRAVENOUS
  Filled 2022-10-05: qty 2

## 2022-10-05 MED ORDER — DIPHENHYDRAMINE HCL 50 MG/ML IJ SOLN
25.0000 mg | Freq: Once | INTRAMUSCULAR | Status: AC
  Administered 2022-10-05: 25 mg via INTRAVENOUS
  Filled 2022-10-05: qty 1

## 2022-10-05 MED ORDER — KETOROLAC TROMETHAMINE 15 MG/ML IJ SOLN
15.0000 mg | Freq: Once | INTRAMUSCULAR | Status: AC
  Administered 2022-10-05: 15 mg via INTRAVENOUS
  Filled 2022-10-05: qty 1

## 2022-10-05 NOTE — ED Notes (Signed)
Pt reports cannot have MRI due to having too much metal in body

## 2022-10-05 NOTE — ED Notes (Signed)
Pt transported to CT ?

## 2022-10-05 NOTE — ED Triage Notes (Signed)
Pt in via EMS from home. Pt started last pm with abnormal facial sensations on left side. Pt reports got better but then came back today. 98% RA, HR 68, 177/97, CBG 119. Pt with hx of 3 cardiac stents, MI 6 years ago.

## 2022-10-05 NOTE — Discharge Instructions (Signed)
Take Tylenol 650 mg every 6 hours as needed for headache.  Call your doctor for follow-up appointment this week.  Come back to the emergency department if you have any new, worsening, unexpected symptoms.

## 2022-10-05 NOTE — ED Provider Notes (Signed)
[facility] Provider Note    Event Date/Time   First MD Initiated Contact with Patient 10/05/22 Paulo Fruit     (approximate)   History   Headache and Numbness   HPI  Adam Lucas is a 72 y.o. male   Past medical history of CAD, hyperlipidemia, hypertension, kidney stones, migraine headaches who presents to the emergency department with left-sided headache consistent with prior migraines.  Feels like pain behind the eye.  No trauma, no recent illnesses.  On review of systems regarding neurologic symptoms he states that he has longstanding peripheral neuropathy for which he soaks his hands and feet in warm water and last night when he did this and he stood up too fast he felt lightheaded.  He no longer feels lightheaded this morning.  No vision changes.  No other acute medical complaints, denies chest pain.  External Medical Documents Reviewed: Internal medicine visit in January 2024 documented past medical history and current medications      Physical Exam   Triage Vital Signs: ED Triage Vitals  Enc Vitals Group     BP 10/05/22 1740 (!) 148/94     Pulse Rate 10/05/22 1740 76     Resp 10/05/22 1740 18     Temp 10/05/22 1740 98 F (36.7 C)     Temp Source 10/05/22 1740 Oral     SpO2 10/05/22 1740 98 %     Weight --      Height --      Head Circumference --      Peak Flow --      Pain Score 10/05/22 1741 5     Pain Loc --      Pain Edu? --      Excl. in GC? --     Most recent vital signs: Vitals:   10/05/22 1830 10/05/22 1900  BP: (!) 142/89 (!) 147/111  Pulse:    Resp: 13 16  Temp:    SpO2:      General: Awake, no distress.  CV:  Good peripheral perfusion.  Resp:  Normal effort.  Abd:  No distention.  Other:  Awake alert comfortable.  In the stretcher with mild hypertension otherwise vital signs within normal limits.  No temporal tenderness.  Vision at baseline.  No visual cuts.  No focal neurologic deficits including motor or sensory  exam of dysarthria facial asymmetry and his gait is stable.   ED Results / Procedures / Treatments   Labs (all labs ordered are listed, but only abnormal results are displayed) Labs Reviewed  CBC - Abnormal; Notable for the following components:      Result Value   RDW 15.9 (*)    All other components within normal limits  COMPREHENSIVE METABOLIC PANEL - Abnormal; Notable for the following components:   BUN 26 (*)    All other components within normal limits  PROTIME-INR  APTT  DIFFERENTIAL  ETHANOL  CBG MONITORING, ED     I ordered and reviewed the above labs they are notable for normal electrolytes and cell counts  EKG  ED ECG REPORT I, Pilar Jarvis, the attending physician, personally viewed and interpreted this ECG.   Date: 10/05/2022  EKG Time: 1743  Rate: 75  Rhythm: sinus   Axis: lad  Intervals:rbbb  ST&T Change: no stemi    RADIOLOGY I independently reviewed and interpreted CT of the head see no obvious bleeding or night shift   PROCEDURES:  Critical Care performed: No  Procedures  MEDICATIONS ORDERED IN ED: Medications  sodium chloride flush (NS) 0.9 % injection 3 mL ( Intravenous Canceled Entry 10/05/22 1744)  acetaminophen (TYLENOL) tablet 1,000 mg (1,000 mg Oral Given 10/05/22 1914)  ketorolac (TORADOL) 15 MG/ML injection 15 mg (15 mg Intravenous Given 10/05/22 1915)  metoCLOPramide (REGLAN) injection 10 mg (10 mg Intravenous Given 10/05/22 1916)  diphenhydrAMINE (BENADRYL) injection 25 mg (25 mg Intravenous Given 10/05/22 1917)  sodium chloride 0.9 % bolus 500 mL (500 mLs Intravenous New Bag/Given 10/05/22 1913)     IMPRESSION / MDM / ASSESSMENT AND PLAN / ED COURSE  I reviewed the triage vital signs and the nursing notes.                                Patient's presentation is most consistent with acute presentation with potential threat to life or bodily function.  Differential diagnosis includes, but is not limited to, intracranial  bleeding, stroke, migraine headache, temporal arteritis   The patient is on the cardiac monitor to evaluate for evidence of arrhythmia and/or significant heart rate changes.  MDM: History of migraines with presentation today consistent with migraine headache.  Given migraine cocktail, reassess.  No focal neurologic deficits disease or stroke and no trauma to suggest intracranial bleeding.  However given his age and his length of time since his last migraine headache and the severity of his symptoms today, will obtain CT head to rule out intracranial bleeding, which is negative.  I doubt subarachnoid given his symptomatology consistent with migraine headache and gradual onset this morning.  Doubt temporal arteritis given no visual changes no temporal tenderness.  -- If after reassessment of migraine cocktail he feels improved, I think outpatient follow-up and discharge at this time is most appropriate.        FINAL CLINICAL IMPRESSION(S) / ED DIAGNOSES   Final diagnoses:  Other migraine without status migrainosus, not intractable     Rx / DC Orders   ED Discharge Orders     None        Note:  This document was prepared using Dragon voice recognition software and may include unintentional dictation errors.    Pilar Jarvis, MD 10/05/22 Rosamaria Lints

## 2022-10-05 NOTE — ED Notes (Signed)
Feels much better after meds. Plan for dc.    Pilar Jarvis, MD 10/05/22 2014

## 2022-10-05 NOTE — ED Triage Notes (Addendum)
Pt to ED via [facility] from home. Pt reports left sided HA and around his left eye "doesn't feel right" that started yesterday night.  Pt reports intermittent blurry vision. Pt denies unilateral weakness or speech changes. Pt with hx of 3 cardiac stents and MI. Pt denies blood thinners.

## 2022-10-05 NOTE — ED Notes (Signed)
PT given soda drink and denies other needs at this time. Call light within reach.

## 2023-07-03 ENCOUNTER — Ambulatory Visit: Payer: Self-pay | Admitting: Cardiology

## 2023-07-29 ENCOUNTER — Ambulatory Visit: Payer: BC Managed Care – PPO | Admitting: Cardiology

## 2023-07-31 ENCOUNTER — Encounter: Payer: Self-pay | Admitting: Gastroenterology

## 2023-08-08 ENCOUNTER — Ambulatory Visit: Payer: Medicare Other | Admitting: Cardiology

## 2023-09-16 ENCOUNTER — Encounter: Payer: Self-pay | Admitting: Gastroenterology

## 2023-10-16 ENCOUNTER — Ambulatory Visit: Payer: Medicare Other | Attending: Cardiology | Admitting: Cardiology

## 2023-10-16 ENCOUNTER — Encounter: Payer: Self-pay | Admitting: Cardiology

## 2023-10-16 ENCOUNTER — Other Ambulatory Visit (HOSPITAL_COMMUNITY): Payer: Self-pay

## 2023-10-16 VITALS — BP 122/70 | HR 74 | Ht 69.0 in | Wt 198.4 lb

## 2023-10-16 DIAGNOSIS — E7800 Pure hypercholesterolemia, unspecified: Secondary | ICD-10-CM | POA: Diagnosis not present

## 2023-10-16 DIAGNOSIS — I10 Essential (primary) hypertension: Secondary | ICD-10-CM

## 2023-10-16 DIAGNOSIS — M160 Bilateral primary osteoarthritis of hip: Secondary | ICD-10-CM

## 2023-10-16 DIAGNOSIS — I25118 Atherosclerotic heart disease of native coronary artery with other forms of angina pectoris: Secondary | ICD-10-CM

## 2023-10-16 MED ORDER — CELECOXIB 100 MG PO CAPS
100.0000 mg | ORAL_CAPSULE | Freq: Every day | ORAL | 0 refills | Status: AC
  Filled 2023-10-16: qty 30, 30d supply, fill #0

## 2023-10-16 NOTE — Patient Instructions (Addendum)
 Medication Instructions:  Start Celebrex  100 mg by mouth daily *If you need a refill on your cardiac medications before your next appointment, please call your pharmacy*  Lab Work: none If you have labs (blood work) drawn today and your tests are completely normal, you will receive your results only by: MyChart Message (if you have MyChart) OR A paper copy in the mail If you have any lab test that is abnormal or we need to change your treatment, we will call you to review the results.  Testing/Procedures: none  Follow-Up: At [facility], you and your health needs are our priority.  As part of our continuing mission to provide you with exceptional heart care, our providers are all part of one team.  This team includes your primary Cardiologist (physician) and Advanced Practice Providers or APPs (Physician Assistants and Nurse Practitioners) who all work together to provide you with the care you need, when you need it.  Your next appointment:   As needed  Provider:   Dr Gelene Kelly no MD populates, click here to update Cardiologist or EP   DO NOT delete brackets or number around this link :1}   We recommend signing up for the patient portal called "MyChart".  Sign up information is provided on this After Visit Summary.  MyChart is used to connect with patients for Virtual Visits (Telemedicine).  Patients are able to view lab/test results, encounter notes, upcoming appointments, etc.  Non-urgent messages can be sent to your provider as well.   To learn more about what you can do with MyChart, go to [email or web address].   Other Instructions

## 2023-10-16 NOTE — Progress Notes (Signed)
 Cardiology Office Note:  .   Date:  10/16/2023  ID:  Adam Lucas, DOB [birth year], MRN [id number] PCP: Antonio Baumgarten, MD  [facility] Providers Cardiologist:  Knox Perl, MD   History of Present Illness: .   Adam Lucas is a 73 y.o.  with coronary artery disease, myocardial infarction and angioplasty to RCA and circumflex in 2016, hypertension,  hyperlipidemia, depression, degenerative joint disease, chronic back pain, spinal stenosis and neurogenic claudication, statin intolerance and presently tolerating Vytorin  10/10 mg daily.   Discussed the use of AI scribe software for clinical note transcription with the patient, who gave verbal consent to proceed.  History of Present Illness Adam Lucas is a 73 year old male with coronary artery disease who presents for a follow-up visit.  He has a history of coronary artery disease with a previous myocardial infarction affecting the inferior and posterior walls. Stents were placed in 2016, and he remains stable. He is on carvedilol  6.25 mg twice daily and olmesartan 20 mg once daily for blood pressure control. Vytorin  is used for cholesterol management, with LDL cholesterol at goal.  He inquires about arthritis medication options that are safe for his heart condition. He experiences arthritis pain in his hips and legs.  He takes vitamin B12 for a deficiency and is considering switching to a women's one-a-day vitamin while continuing with vitamin B12. He sleeps well with no recent changes in his sleep pattern.   Labs    Lab Results  Component Value Date   NA 138 10/05/2022   K 4.5 10/05/2022   CO2 26 10/05/2022   GLUCOSE 99 10/05/2022   BUN 26 (H) 10/05/2022   CREATININE 1.20 10/05/2022   CALCIUM  9.4 10/05/2022   GFRNONAA >60 10/05/2022      Latest Ref Rng & Units 10/05/2022    5:45 PM 10/08/2017    1:34 PM 08/10/2014    2:43 AM  BMP  Glucose 70 - 99 mg/dL 99  629  528   BUN 8 - 23 mg/dL 26  21  14    Creatinine 0.61 -  1.24 mg/dL 4.13  2.44  0.10   Sodium 135 - 145 mmol/L 138  136  138   Potassium 3.5 - 5.1 mmol/L 4.5  3.7  4.2   Chloride 98 - 111 mmol/L 105  104  105   CO2 22 - 32 mmol/L 26  25  29    Calcium  8.9 - 10.3 mg/dL 9.4  9.2  8.8       Latest Ref Rng & Units 10/05/2022    5:45 PM 10/08/2017    1:34 PM 08/10/2014    2:43 AM  CBC  WBC 4.0 - 10.5 K/uL 7.6  13.7  7.4   Hemoglobin 13.0 - 17.0 g/dL 27.2  53.6  64.4   Hematocrit 39.0 - 52.0 % 45.9  48.3  40.7   Platelets 150 - 400 K/uL 201  237  222    No results found for: "HGBA1C"  Lab Results  Component Value Date   TSH 3.322 07/17/2014    External Labs:  PCP labs on Care Everywhere 07/08/2023:  Hb 15.9/HCT 48.7, platelets 236.  Serum glucose 84 mg, serum potassium 4.5, BUN 22, creatinine 1.3, EGFR 58 mL.  A1c 6.2%.  Total cholesterol 132, triglycerides 92, HDL 40.8, LDL 73.  ROS  Review of Systems  Cardiovascular:  Negative for chest pain, dyspnea on exertion and leg swelling.    Physical Exam:   VS:  BP 122/70 (BP Location: Left Arm, Patient Position: Sitting, Cuff Size: Normal)   Pulse 74   Ht 5\' 9"  (1.753 m)   Wt 198 lb 6.4 oz (90 kg)   SpO2 94%   BMI 29.30 kg/m    Wt Readings from Last 3 Encounters:  10/16/23 198 lb 6.4 oz (90 kg)  07/02/22 199 lb 6.4 oz (90.4 kg)  01/08/22 193 lb (87.5 kg)    Physical Exam Neck:     Vascular: No carotid bruit or JVD.  Cardiovascular:     Rate and Rhythm: Normal rate and regular rhythm.     Pulses: Intact distal pulses.     Heart sounds: Normal heart sounds. No murmur heard.    No gallop.  Pulmonary:     Effort: Pulmonary effort is normal.     Breath sounds: Normal breath sounds.  Abdominal:     General: Bowel sounds are normal.     Palpations: Abdomen is soft.  Musculoskeletal:     Right lower leg: No edema.     Left lower leg: No edema.    Studies Reviewed: Aaron Aas    EKG:    EKG Interpretation Date/Time:  Thursday Oct 16 2023 11:16:51 EDT Ventricular Rate:  75 PR  Interval:  206 QRS Duration:  140 QT Interval:  426 QTC Calculation: 475 R Axis:   -56  Text Interpretation: EKG 10/16/2023: Normal sinus rhythm at rate of 75 bpm, left axis deviation, left anterior fascicular block.  Cannot rule out inferior infarct old. Right bundle branch block.  Posterior infarct old.  Nonspecific T abnormality.  Compared to 10/05/2022, no significant change from 07/02/2022. Confirmed by Terrelle Ruffolo, Jagadeesh ([id number]) on 10/16/2023 11:49:16 AM    Medications and allergies    Allergies  Allergen Reactions   Pravastatin  Other (See Comments)    Myalgia   Atorvastatin  Other (See Comments)    Muscle/leg pain.   Crestor [Rosuvastatin Calcium ] Other (See Comments)    Muscle pain     Current Outpatient Medications:    aspirin  81 MG chewable tablet, Chew 81 mg by mouth daily., Disp: , Rfl:    carvedilol  (COREG ) 6.25 MG tablet, Take 1 tablet (6.25 mg total) by mouth 2 (two) times daily with a meal., Disp: 60 tablet, Rfl: 1   celecoxib  (CELEBREX ) 100 MG capsule, Take 1 capsule (100 mg total) by mouth daily., Disp: 30 capsule, Rfl: 0   citalopram (CELEXA) 40 MG tablet, Take 40 mg by mouth daily., Disp: , Rfl:    cyanocobalamin  1000 MCG tablet, Take 1,000 mcg by mouth daily. , Disp: , Rfl:    ergocalciferol  (VITAMIN D2) 1.25 MG (50000 UT) capsule, Take 50,000 Units by mouth once a week., Disp: , Rfl:    ezetimibe -simvastatin  (VYTORIN ) 10-10 MG tablet, Take 1 tablet by mouth at bedtime., Disp: 90 tablet, Rfl: 3   magnesium  oxide (MAG-OX) 400 MG tablet, Take 400 mg by mouth daily., Disp: , Rfl:    nitroGLYCERIN  (NITROSTAT ) 0.4 MG SL tablet, DISSOLVE 1 TABLET(0.4 MG TOTAL) UNDER THE TONGUE EVERY 5 MINUTES FOR 3 DOSES AS NEEDED FOR CHEST PAIN, Disp: 25 tablet, Rfl: 4   olmesartan (BENICAR) 20 MG tablet, Take 20 mg by mouth daily., Disp: , Rfl:    Thiamine HCl (VITAMIN B-1) 250 MG tablet, Take 250 mg by mouth daily. , Disp: , Rfl:    traMADol  (ULTRAM ) 50 MG tablet, Take 1-2 tablets (50-100 mg  total) by mouth every 4 (four) hours as needed for moderate pain. (Patient taking  differently: Take 100-200 mg by mouth See admin instructions. Take 200 mg by mouth in the morning and 100 mg at bedtime), Disp: 60 tablet, Rfl: 0   Turmeric Curcumin 500 MG CAPS, Take 500 mg by mouth at bedtime., Disp: , Rfl:    vitamin C  (ASCORBIC ACID ) 500 MG tablet, Take 500 mg by mouth daily., Disp: , Rfl:    zolpidem  (AMBIEN ) 5 MG tablet, Take 5 mg by mouth at bedtime as needed., Disp: , Rfl:    Meds ordered this encounter  Medications   celecoxib  (CELEBREX ) 100 MG capsule    Sig: Take 1 capsule (100 mg total) by mouth daily.    Dispense:  30 capsule    Refill:  0    Refills to Dr. Antonio Baumgarten     Medications Discontinued During This Encounter  Medication Reason   acetaminophen  (TYLENOL ) 650 MG CR tablet Patient Preference   aspirin  EC 81 MG tablet Patient Preference   fluticasone  (FLONASE ) 50 MCG/ACT nasal spray Patient Preference     ASSESSMENT AND PLAN: .      ICD-10-CM   1. Coronary artery disease of native artery of native heart with stable angina pectoris (HCC)  I25.118 EKG 12-Lead    2. HTN (hypertension), benign  I10     3. Hypercholesteremia  E78.00     4. Primary osteoarthritis of both hips  M16.0 celecoxib  (CELEBREX ) 100 MG capsule      Assessment and Plan Assessment & Plan Coronary artery disease with myocardial infarction   He has coronary artery disease with a myocardial infarction affecting the inferior and posterior walls. A stent was placed in 2016, and the condition remains well-controlled with no recent cardiac events. Current medications, carvedilol  and olmesartan, effectively manage his hypertension. Continue carvedilol  6.25 mg twice daily and olmesartan 20 mg once daily. Encourage a weight loss of 2-3 pounds to improve cardiovascular health.  Hyperlipidemia   His hyperlipidemia is well-managed with Vytorin , maintaining LDL cholesterol at the goal of around 70  mg/dL, with no reported side effects. Continue Vytorin  as prescribed. Encourage dietary modifications and regular exercise to maintain cholesterol levels.  Arthritis of hips   Arthritis in the hips causes significant discomfort. Celebrex  was discussed as a potential treatment option, being relatively safe at low doses but with a slight risk of increasing hypertension and coronary artery disease. Emphasized the importance of quality of life and effective pain management. Prescribe Celebrex  100 mg, starting with one pill every other day for a week, then adjust based on response. Consider taking Celebrex  as needed for activities that may exacerbate joint pain. Coordinate with the primary care physician for ongoing prescription management if Celebrex  is effective.  Vitamin B12 deficiency   His vitamin B12 deficiency is managed with supplementation, currently 1000 units of vitamin B12. Continue vitamin B12 supplementation as prescribed. Ensure any multivitamin taken includes adequate vitamin B12.  As he has remained stable, I will see him back on a as needed basis.  Signed,  Knox Perl, MD, [facility] 10/16/2023, 12:47 PM [facility] [address] [place], [place] [address] Phone: [phone]. Fax:  [phone]

## 2023-10-29 ENCOUNTER — Encounter: Payer: Self-pay | Admitting: Gastroenterology

## 2023-11-05 ENCOUNTER — Encounter: Payer: Self-pay | Admitting: Gastroenterology

## 2023-11-06 ENCOUNTER — Ambulatory Visit: Admitting: General Practice

## 2023-11-06 ENCOUNTER — Ambulatory Visit
Admission: RE | Admit: 2023-11-06 | Discharge: 2023-11-06 | Disposition: A | Discharge status: Home / Self Care | Payer: Medicare Other | Attending: Gastroenterology | Admitting: Gastroenterology

## 2023-11-06 ENCOUNTER — Other Ambulatory Visit: Payer: Self-pay

## 2023-11-06 ENCOUNTER — Encounter
Admission: RE | Disposition: A | Discharge status: Home / Self Care | Payer: Self-pay | Source: Home / Self Care | Attending: Gastroenterology

## 2023-11-06 ENCOUNTER — Encounter: Payer: Self-pay | Admitting: Gastroenterology

## 2023-11-06 DIAGNOSIS — F32A Depression, unspecified: Secondary | ICD-10-CM | POA: Diagnosis not present

## 2023-11-06 DIAGNOSIS — Z79899 Other long term (current) drug therapy: Secondary | ICD-10-CM | POA: Insufficient documentation

## 2023-11-06 DIAGNOSIS — I10 Essential (primary) hypertension: Secondary | ICD-10-CM | POA: Insufficient documentation

## 2023-11-06 DIAGNOSIS — I252 Old myocardial infarction: Secondary | ICD-10-CM | POA: Insufficient documentation

## 2023-11-06 DIAGNOSIS — D120 Benign neoplasm of cecum: Secondary | ICD-10-CM | POA: Diagnosis not present

## 2023-11-06 DIAGNOSIS — I2510 Atherosclerotic heart disease of native coronary artery without angina pectoris: Secondary | ICD-10-CM | POA: Diagnosis not present

## 2023-11-06 DIAGNOSIS — K640 First degree hemorrhoids: Secondary | ICD-10-CM | POA: Diagnosis not present

## 2023-11-06 DIAGNOSIS — Z1211 Encounter for screening for malignant neoplasm of colon: Secondary | ICD-10-CM | POA: Diagnosis present

## 2023-11-06 DIAGNOSIS — D122 Benign neoplasm of ascending colon: Secondary | ICD-10-CM | POA: Diagnosis not present

## 2023-11-06 HISTORY — DX: Major depressive disorder, single episode, unspecified: F32.9

## 2023-11-06 HISTORY — DX: Polyneuropathy, unspecified: G62.9

## 2023-11-06 HISTORY — PX: COLONOSCOPY WITH PROPOFOL: SHX5780

## 2023-11-06 HISTORY — DX: Male erectile dysfunction, unspecified: N52.9

## 2023-11-06 HISTORY — DX: Depression, unspecified: F32.A

## 2023-11-06 HISTORY — DX: Other symptoms and signs involving the musculoskeletal system: R29.898

## 2023-11-06 HISTORY — DX: Neuralgia and neuritis, unspecified: M79.2

## 2023-11-06 SURGERY — COLONOSCOPY WITH PROPOFOL
Anesthesia: General

## 2023-11-06 MED ORDER — SODIUM CHLORIDE 0.9 % IV SOLN: INTRAVENOUS | Status: DC

## 2023-11-06 MED ORDER — LIDOCAINE HCL (CARDIAC) PF 100 MG/5ML IV SOSY
PREFILLED_SYRINGE | INTRAVENOUS | Status: DC | PRN
  Administered 2023-11-06: 100 mg via INTRAVENOUS

## 2023-11-06 MED ORDER — PROPOFOL 500 MG/50ML IV EMUL
INTRAVENOUS | Status: DC | PRN
  Administered 2023-11-06: 150 ug/kg/min via INTRAVENOUS
  Administered 2023-11-06: 50 mg via INTRAVENOUS

## 2023-11-06 MED ORDER — PHENYLEPHRINE 80 MCG/ML (10ML) SYRINGE FOR IV PUSH (FOR BLOOD PRESSURE SUPPORT)
PREFILLED_SYRINGE | INTRAVENOUS | Status: DC | PRN
  Administered 2023-11-06 (×2): 160 ug via INTRAVENOUS

## 2023-11-06 NOTE — Op Note (Signed)
 [facility] Gastroenterology Patient Name: Adam Lucas Procedure Date: 11/06/2023 9:35 AM MRN: [id number] Account #: [id number] Date of Birth: 1950/09/15 Admit Type: Outpatient Age: 73 Room: [facility] ENDO ROOM 1 Gender: Male Note Status: Finalized Instrument Name: Colonoscope [id number] Procedure:             Colonoscopy Indications:           High risk colon cancer surveillance: Personal history                         of colonic polyps Providers:             Quintin Buckle DO, DO Referring MD:          Antonio Baumgarten, MD (Referring MD) Medicines:             Monitored Anesthesia Care Complications:         No immediate complications. Estimated blood loss:                         Minimal. Procedure:             Pre-Anesthesia Assessment:                        - Prior to the procedure, a History and Physical was                         performed, and patient medications and allergies were                         reviewed. The patient is competent. The risks and                         benefits of the procedure and the sedation options and                         risks were discussed with the patient. All questions                         were answered and informed consent was obtained.                         Patient identification and proposed procedure were                         verified by the physician, the nurse, the anesthetist                         and the technician in the endoscopy suite. Mental                         Status Examination: alert and oriented. Airway                         Examination: normal oropharyngeal airway and neck                         mobility. Respiratory Examination: clear to  auscultation. CV Examination: RRR, no murmurs, no S3                         or S4. Prophylactic Antibiotics: The patient does not                         require prophylactic antibiotics. Prior                          Anticoagulants: The patient has taken no anticoagulant                         or antiplatelet agents. ASA Grade Assessment: III - A                         patient with severe systemic disease. After reviewing                         the risks and benefits, the patient was deemed in                         satisfactory condition to undergo the procedure. The                         anesthesia plan was to use monitored anesthesia care                         (MAC). Immediately prior to administration of                         medications, the patient was re-assessed for adequacy                         to receive sedatives. The heart rate, respiratory                         rate, oxygen saturations, blood pressure, adequacy of                         pulmonary ventilation, and response to care were                         monitored throughout the procedure. The physical                         status of the patient was re-assessed after the                         procedure.                        After obtaining informed consent, the colonoscope was                         passed under direct vision. Throughout the procedure,                         the patient's blood pressure, pulse, and oxygen  saturations were monitored continuously. The                         Colonoscope was introduced through the anus and                         advanced to the the terminal ileum, with                         identification of the appendiceal orifice and IC                         valve. The colonoscopy was performed without                         difficulty. The patient tolerated the procedure well.                         The quality of the bowel preparation was evaluated                         using the BBPS [facility] Bowel Preparation Scale) with                         scores of: Right Colon = 2 (minor amount of residual                         staining, small fragments  of stool and/or opaque                         liquid, but mucosa seen well), Transverse Colon = 2                         (minor amount of residual staining, small fragments of                         stool and/or opaque liquid, but mucosa seen well) and                         Left Colon = 2 (minor amount of residual staining,                         small fragments of stool and/or opaque liquid, but                         mucosa seen well). The total BBPS score equals 6. The                         quality of the bowel preparation was good. The                         terminal ileum, ileocecal valve, appendiceal orifice,                         and rectum were photographed. Findings:      The perianal and digital rectal examinations were normal. Pertinent       negatives include normal  sphincter tone.      The terminal ileum appeared normal. Estimated blood loss: none.       Estimated blood loss: none.      Retroflexion in the right colon was performed.      Non-bleeding internal hemorrhoids were found during retroflexion. The       hemorrhoids were Grade I (internal hemorrhoids that do not prolapse).       Estimated blood loss: none.      Two sessile polyps were found in the ascending colon and cecum. The       polyps were 4 to 6 mm in size. These polyps were removed with a cold       snare. Resection and retrieval were complete. Estimated blood loss was       minimal.      A 1 to 2 mm polyp was found in the ascending colon. The polyp was       sessile. The polyp was removed with a jumbo cold forceps. Resection and       retrieval were complete. Estimated blood loss was minimal.      The exam was otherwise without abnormality on direct and retroflexion       views. Impression:            - The examined portion of the ileum was normal.                        - Non-bleeding internal hemorrhoids.                        - Two 4 to 6 mm polyps in the ascending colon and in                          the cecum, removed with a cold snare. Resected and                         retrieved.                        - One 1 to 2 mm polyp in the ascending colon, removed                         with a jumbo cold forceps. Resected and retrieved.                        - The examination was otherwise normal on direct and                         retroflexion views. Recommendation:        - Patient has a contact number available for                         emergencies. The signs and symptoms of potential                         delayed complications were discussed with the patient.                         Return to normal activities tomorrow. Written  discharge instructions were provided to the patient.                        - Discharge patient to home.                        - Resume previous diet.                        - Continue present medications.                        - No ibuprofen, naproxen, or other non-steroidal                         anti-inflammatory drugs for 5 days after polyp removal.                        - Await pathology results.                        - Repeat colonoscopy for surveillance after piecemeal                         polypectomy.                        - Return to referring physician as previously                         scheduled.                        - The findings and recommendations were discussed with                         the patient. Procedure Code(s):     --- Professional ---                        [phone], Colonoscopy, flexible; with removal of                         tumor(s), polyp(s), or other lesion(s) by snare                         technique                        45380, 59, Colonoscopy, flexible; with biopsy, single                         or multiple Diagnosis Code(s):     --- Professional ---                        Z86.010, Personal history of colonic polyps                        K64.0, First degree  hemorrhoids                        D12.2, Benign neoplasm of ascending colon  D12.0, Benign neoplasm of cecum CPT copyright 2022 American Medical Association. All rights reserved. The codes documented in this report are preliminary and upon coder review may  be revised to meet current compliance requirements. Attending Participation:      I personally performed the entire procedure. Polo Brisk, DO Quintin Buckle DO, DO 11/06/2023 10:08:44 AM This report has been signed electronically. Number of Addenda: 0 Note Initiated On: 11/06/2023 9:35 AM Scope Withdrawal Time: 0 hours 14 minutes 27 seconds  Total Procedure Duration: 0 hours 21 minutes 12 seconds  Estimated Blood Loss:  Estimated blood loss was minimal.      [facility]

## 2023-11-06 NOTE — Transfer of Care (Deleted)
 Immediate Anesthesia Transfer of Care Note  Patient: Adam Lucas  Procedure(s) Performed: COLONOSCOPY WITH PROPOFOL   Patient Location: PACU  Anesthesia Type:General  Level of Consciousness: awake and patient cooperative  Airway & Oxygen Therapy: Patient Spontanous Breathing  Post-op Assessment: Report given to RN and Post -op Vital signs reviewed and stable  Post vital signs: stable  Last Vitals:  Vitals Value Taken Time  BP    Temp    Pulse    Resp    SpO2      Last Pain:  Vitals:   11/06/23 0836  TempSrc: Temporal         Complications: No notable events documented.

## 2023-11-06 NOTE — Interval H&P Note (Signed)
 History and Physical Interval Note: Preprocedure H&P from 11/06/23  was reviewed and there was no interval change after seeing and examining the patient.  Written consent was obtained from the patient after discussion of risks, benefits, and alternatives. Patient has consented to proceed with Colonoscopy with possible intervention   11/06/2023 9:33 AM  Chari Como  has presented today for surgery, with the diagnosis of PH Colon Polyps.  The various methods of treatment have been discussed with the patient and family. After consideration of risks, benefits and other options for treatment, the patient has consented to  Procedure(s): COLONOSCOPY WITH PROPOFOL  (N/A) as a surgical intervention.  The patient's history has been reviewed, patient examined, no change in status, stable for surgery.  I have reviewed the patient's chart and labs.  Questions were answered to the patient's satisfaction.     Adam Lucas

## 2023-11-06 NOTE — Anesthesia Preprocedure Evaluation (Signed)
 Anesthesia Evaluation  Patient identified by MRN, date of birth, ID band Patient awake    Reviewed: Allergy & Precautions, H&P , NPO status , Patient's Chart, lab work & pertinent test results, reviewed documented beta blocker date and time   Airway Mallampati: II  TM Distance: >3 FB Neck ROM: full    Dental  (+) Teeth Intact   Pulmonary neg pulmonary ROS   Pulmonary exam normal        Cardiovascular Exercise Tolerance: Poor hypertension, On Medications + CAD and + Past MI  negative cardio ROS Normal cardiovascular exam+ Valvular Problems/Murmurs  Rate:Normal     Neuro/Psych  Headaches   Depression     Neuromuscular disease negative neurological ROS  negative psych ROS   GI/Hepatic negative GI ROS, Neg liver ROS,,,  Endo/Other  negative endocrine ROS    Renal/GU negative Renal ROS  negative genitourinary   Musculoskeletal   Abdominal   Peds  Hematology negative hematology ROS (+)   Anesthesia Other Findings Past Medical History: No date: Bilateral leg weakness No date: Carpal tunnel syndrome No date: Chronic lower back pain No date: Coronary artery disease No date: Degenerative arthritis No date: Depression No date: ED (erectile dysfunction) ~ 1958: Heart murmur No date: High cholesterol No date: History of kidney stones No date: Hypertension No date: Hypogonadism in male No date: Major depressive disorder No date: Migraine     Comment:  "once q year or 2" (08/09/2014) 07/17/2014: Myocardial infarction (HCC) No date: Neuropathic pain of lower extremity No date: Neuropathy No date: Polyneuropathy No date: Seasonal allergies  Past Surgical History: No date: BACK SURGERY     Comment:  fusion levels l 3,4,5 s1 2015: CARPAL TUNNEL RELEASE; Right 06/23/2019: CARPAL TUNNEL RELEASE; Left     Comment:  Procedure: CARPAL TUNNEL RELEASE;  Surgeon: Arlyne Lame, MD;  Location: [facility] ORS;   Service: Orthopedics;               Laterality: Left; No date: CATARACT EXTRACTION; N/A 11/02/2015: COLONOSCOPY WITH PROPOFOL ; N/A     Comment:  Procedure: COLONOSCOPY WITH PROPOFOL ;  Surgeon: Cassie Click, MD;  Location: [facility] ENDOSCOPY;  Service:               Endoscopy;  Laterality: N/A; 08/09/2014: CORONARY ANGIOGRAM     Comment:  Procedure: CORONARY ANGIOGRAM;  Surgeon: Jessica Morn, MD;  Location: [facility] CATH LAB;  Service:               Cardiovascular;; 07/17/2014; 08/09/2014: CORONARY ANGIOPLASTY WITH STENT PLACEMENT     Comment:  "1; 2" 2000's X 1: EXTRACORPOREAL SHOCK WAVE LITHOTRIPSY No date: EYE SURGERY No date: FRACTURE SURGERY ~ 2013: HAND LIGAMENT RECONSTRUCTION; Right     Comment:  "thumb" No date: HIP SURGERY; Left     Comment:  10/19/17 No date: JOINT REPLACEMENT; Left     Comment:  hip ~ 2002: KNEE ARTHROSCOPY; Left 07/17/2014: LEFT HEART CATHETERIZATION WITH CORONARY ANGIOGRAM; N/A     Comment:  Procedure: LEFT HEART CATHETERIZATION WITH CORONARY               ANGIOGRAM;  Surgeon: Jessica Morn, MD;  Location: [facility]  CATH LAB;  Service: Cardiovascular;  Laterality: N/A; ~ 2012: ORIF FINGER / THUMB FRACTURE; Right 08/09/2014: PERCUTANEOUS CORONARY STENT INTERVENTION (PCI-S)     Comment:  Procedure: PERCUTANEOUS CORONARY STENT INTERVENTION               (PCI-S);  Surgeon: Jessica Morn, MD;  Location: [facility]               CATH LAB;  Service: Cardiovascular;;  mid circ promus               4x28 mid LAD promus 2.75x12 2005: POSTERIOR LUMBAR FUSION 4 LEVEL     Comment:  "L3, 4, 5; S1" No date: Right ear surgery; Right No date: SPINAL NEUROSTIMULATOR; N/A 1970'S: TONSILLECTOMY AND ADENOIDECTOMY 10/20/2017: TOTAL HIP ARTHROPLASTY; Left     Comment:  Procedure: TOTAL HIP ARTHROPLASTY;  Surgeon: Arlyne Lame, MD;  Location: [facility] ORS;  Service: Orthopedics;               Laterality: Left;  BMI    Body Mass  Index: 28.26 kg/m      Reproductive/Obstetrics negative OB ROS                             Anesthesia Physical Anesthesia Plan  ASA: 3  Anesthesia Plan: General   Post-op Pain Management: Minimal or no pain anticipated   Induction: Intravenous  PONV Risk Score and Plan: 3 and Propofol  infusion, TIVA and Ondansetron   Airway Management Planned: Nasal Cannula  Additional Equipment: None  Intra-op Plan:   Post-operative Plan:   Informed Consent: I have reviewed the patients History and Physical, chart, labs and discussed the procedure including the risks, benefits and alternatives for the proposed anesthesia with the patient or authorized representative who has indicated his/her understanding and acceptance.     Dental advisory given  Plan Discussed with: CRNA and Surgeon  Anesthesia Plan Comments: (Discussed risks of anesthesia with patient, including possibility of difficulty with spontaneous ventilation under anesthesia necessitating airway intervention, PONV, and rare risks such as cardiac or respiratory or neurological events, and allergic reactions. Discussed the role of CRNA in patient's perioperative care. Patient understands.)       Anesthesia Quick Evaluation

## 2023-11-06 NOTE — Anesthesia Postprocedure Evaluation (Signed)
 Anesthesia Post Note  Patient: Adam Lucas  Procedure(s) Performed: COLONOSCOPY WITH PROPOFOL   Patient location during evaluation: Endoscopy Anesthesia Type: General Level of consciousness: awake and alert Pain management: pain level controlled Vital Signs Assessment: post-procedure vital signs reviewed and stable Respiratory status: spontaneous breathing, nonlabored ventilation, respiratory function stable and patient connected to nasal cannula oxygen Cardiovascular status: blood pressure returned to baseline and stable Postop Assessment: no apparent nausea or vomiting Anesthetic complications: no  There were no known notable events for this encounter.   Last Vitals:  Vitals:   11/06/23 1008 11/06/23 1038  BP: 110/71 (!) 154/100  Pulse:    Resp:    Temp: (!) 36.3 C   SpO2: 98%     Last Pain:  Vitals:   11/06/23 1038  TempSrc:   PainSc: 0-No pain                 Enrique Harvest

## 2023-11-06 NOTE — H&P (Signed)
 Pre-Procedure H&P   Patient ID: Adam Lucas is a 73 y.o. male.  Gastroenterology Provider: Quintin Buckle, DO  Referring Provider: Rodena Citron, NP PCP: Antonio Baumgarten, MD  Date: 11/06/2023  HPI Mr. Adam Lucas is a 73 y.o. male who presents today for Colonoscopy for Personal history of colon polyps.  Bowel movement every 2 days.  No melena or hematochezia  Last underwent colonoscopy in May 2017 with 2 adenomatous polyps and internal hemorrhoids.  No family history of colon cancer or colon polyps  Status post left hip replacement as well as lumbar fusion. Also with spinal stimulator  Hemoglobin 16 MCV 82.5 platelets 236,000   Past Medical History:  Diagnosis Date   Bilateral leg weakness    Carpal tunnel syndrome    Chronic lower back pain    Coronary artery disease    Degenerative arthritis    Depression    ED (erectile dysfunction)    Heart murmur ~ 1958   High cholesterol    History of kidney stones    Hypertension    Hypogonadism in male    Major depressive disorder    Migraine    "once q year or 2" (08/09/2014)   Myocardial infarction (HCC) 07/17/2014   Neuropathic pain of lower extremity    Neuropathy    Polyneuropathy    Seasonal allergies     Past Surgical History:  Procedure Laterality Date   BACK SURGERY     fusion levels l 3,4,5 s1   CARPAL TUNNEL RELEASE Right 2015   CARPAL TUNNEL RELEASE Left 06/23/2019   Procedure: CARPAL TUNNEL RELEASE;  Surgeon: Arlyne Lame, MD;  Location: [facility] ORS;  Service: Orthopedics;  Laterality: Left;   CATARACT EXTRACTION N/A    COLONOSCOPY WITH PROPOFOL  N/A 11/02/2015   Procedure: COLONOSCOPY WITH PROPOFOL ;  Surgeon: Cassie Click, MD;  Location: [facility] ENDOSCOPY;  Service: Endoscopy;  Laterality: N/A;   CORONARY ANGIOGRAM  08/09/2014   Procedure: CORONARY ANGIOGRAM;  Surgeon: Jessica Morn, MD;  Location: [facility] CATH LAB;  Service: Cardiovascular;;   CORONARY ANGIOPLASTY WITH STENT PLACEMENT   07/17/2014; 08/09/2014   "1; 2"   EXTRACORPOREAL SHOCK WAVE LITHOTRIPSY  2000's X 1   EYE SURGERY     FRACTURE SURGERY     HAND LIGAMENT RECONSTRUCTION Right ~ 2013   "thumb"   HIP SURGERY Left    10/19/17   JOINT REPLACEMENT Left    hip   KNEE ARTHROSCOPY Left ~ 2002   LEFT HEART CATHETERIZATION WITH CORONARY ANGIOGRAM N/A 07/17/2014   Procedure: LEFT HEART CATHETERIZATION WITH CORONARY ANGIOGRAM;  Surgeon: Jessica Morn, MD;  Location: [facility] CATH LAB;  Service: Cardiovascular;  Laterality: N/A;   ORIF FINGER / THUMB FRACTURE Right ~ 2012   PERCUTANEOUS CORONARY STENT INTERVENTION (PCI-S)  08/09/2014   Procedure: PERCUTANEOUS CORONARY STENT INTERVENTION (PCI-S);  Surgeon: Jessica Morn, MD;  Location: [facility] CATH LAB;  Service: Cardiovascular;;  mid circ promus 4x28 mid LAD promus 2.75x12   POSTERIOR LUMBAR FUSION 4 LEVEL  2005   "L3, 4, 5; S1"   Right ear surgery Right    SPINAL NEUROSTIMULATOR N/A    TONSILLECTOMY AND ADENOIDECTOMY  1970'S   TOTAL HIP ARTHROPLASTY Left 10/20/2017   Procedure: TOTAL HIP ARTHROPLASTY;  Surgeon: Arlyne Lame, MD;  Location: [facility] ORS;  Service: Orthopedics;  Laterality: Left;    Family History No h/o GI disease or malignancy  Review of Systems  Constitutional:  Negative for activity change, appetite  change, chills, diaphoresis, fatigue, fever and unexpected weight change.  HENT:  Negative for trouble swallowing and voice change.   Respiratory:  Negative for shortness of breath and wheezing.   Cardiovascular:  Negative for chest pain, palpitations and leg swelling.  Gastrointestinal:  Negative for abdominal distention, abdominal pain, anal bleeding, blood in stool, constipation, diarrhea, nausea and vomiting.  Musculoskeletal:  Negative for arthralgias and myalgias.  Skin:  Negative for color change and pallor.  Neurological:  Negative for dizziness, syncope and weakness.  Psychiatric/Behavioral:  Negative for confusion. The patient is not  nervous/anxious.   All other systems reviewed and are negative.    Medications No current facility-administered medications on file prior to encounter.   Current Outpatient Medications on File Prior to Encounter  Medication Sig Dispense Refill   carvedilol  (COREG ) 6.25 MG tablet Take 1 tablet (6.25 mg total) by mouth 2 (two) times daily with a meal. 60 tablet 1   cyanocobalamin  1000 MCG tablet Take 1,000 mcg by mouth daily.      ergocalciferol  (VITAMIN D2) 1.25 MG (50000 UT) capsule Take 50,000 Units by mouth once a week.     magnesium  oxide (MAG-OX) 400 MG tablet Take 400 mg by mouth daily.     olmesartan (BENICAR) 20 MG tablet Take 20 mg by mouth daily.     Thiamine HCl (VITAMIN B-1) 250 MG tablet Take 250 mg by mouth daily.      traMADol  (ULTRAM ) 50 MG tablet Take 1-2 tablets (50-100 mg total) by mouth every 4 (four) hours as needed for moderate pain. (Patient taking differently: Take 100-200 mg by mouth See admin instructions. Take 200 mg by mouth in the morning and 100 mg at bedtime) 60 tablet 0   Turmeric Curcumin 500 MG CAPS Take 500 mg by mouth at bedtime.     vitamin C  (ASCORBIC ACID ) 500 MG tablet Take 500 mg by mouth daily.     zolpidem  (AMBIEN ) 5 MG tablet Take 5 mg by mouth at bedtime as needed.     citalopram (CELEXA) 40 MG tablet Take 40 mg by mouth daily. (Patient not taking: Reported on 11/06/2023)     ezetimibe -simvastatin  (VYTORIN ) 10-10 MG tablet Take 1 tablet by mouth at bedtime. (Patient not taking: Reported on 11/06/2023) 90 tablet 3   nitroGLYCERIN  (NITROSTAT ) 0.4 MG SL tablet DISSOLVE 1 TABLET(0.4 MG TOTAL) UNDER THE TONGUE EVERY 5 MINUTES FOR 3 DOSES AS NEEDED FOR CHEST PAIN 25 tablet 4    Pertinent medications related to GI and procedure were reviewed by me with the patient prior to the procedure   Current Facility-Administered Medications:    0.9 %  sodium chloride  infusion, , Intravenous, Continuous, Quintin Buckle, DO, Last Rate: 20 mL/hr at 11/06/23  1914, Continued from Pre-op at 11/06/23 0849  sodium chloride  20 mL/hr at 11/06/23 7829       Allergies  Allergen Reactions   Pravastatin  Other (See Comments)    Myalgia   Atorvastatin  Other (See Comments)    Muscle/leg pain.   Crestor [Rosuvastatin Calcium ] Other (See Comments)    Muscle pain   Allergies were reviewed by me prior to the procedure  Objective   Body mass index is 28.26 kg/m. Vitals:   11/06/23 0836  BP: (!) 158/95  Pulse: 82  Resp: 20  Temp: (!) 96.8 F (36 C)  TempSrc: Temporal  SpO2: 98%  Weight: 86.8 kg  Height: 5\' 9"  (1.753 m)     Physical Exam Vitals and nursing note reviewed.  Constitutional:      General: He is not in acute distress.    Appearance: Normal appearance. He is not ill-appearing, toxic-appearing or diaphoretic.  HENT:     Head: Normocephalic and atraumatic.     Nose: Nose normal.     Mouth/Throat:     Mouth: Mucous membranes are moist.     Pharynx: Oropharynx is clear.  Eyes:     General: No scleral icterus.    Extraocular Movements: Extraocular movements intact.  Cardiovascular:     Rate and Rhythm: Normal rate and regular rhythm.     Heart sounds: Normal heart sounds. No murmur heard.    No friction rub. No gallop.  Pulmonary:     Effort: Pulmonary effort is normal. No respiratory distress.     Breath sounds: Normal breath sounds. No wheezing, rhonchi or rales.  Abdominal:     General: Bowel sounds are normal. There is no distension.     Palpations: Abdomen is soft.     Tenderness: There is no abdominal tenderness. There is no guarding or rebound.  Musculoskeletal:     Cervical back: Neck supple.     Right lower leg: No edema.     Left lower leg: No edema.  Skin:    General: Skin is warm and dry.     Coloration: Skin is not jaundiced or pale.  Neurological:     General: No focal deficit present.     Mental Status: He is alert and oriented to person, place, and time. Mental status is at baseline.  Psychiatric:         Mood and Affect: Mood normal.        Behavior: Behavior normal.        Thought Content: Thought content normal.        Judgment: Judgment normal.      Assessment:  Mr. Adam Lucas is a 73 y.o. male  who presents today for Colonoscopy for Personal history of colon polyps .  Plan:  Colonoscopy with possible intervention today  Colonoscopy with possible biopsy, control of bleeding, polypectomy, and interventions as necessary has been discussed with the patient/patient representative. Informed consent was obtained from the patient/patient representative after explaining the indication, nature, and risks of the procedure including but not limited to death, bleeding, perforation, missed neoplasm/lesions, cardiorespiratory compromise, and reaction to medications. Opportunity for questions was given and appropriate answers were provided. Patient/patient representative has verbalized understanding is amenable to undergoing the procedure.   Quintin Buckle, DO  [facility]  Portions of the record may have been created with voice recognition software. Occasional wrong-word or 'sound-a-like' substitutions may have occurred due to the inherent limitations of voice recognition software.  Read the chart carefully and recognize, using context, where substitutions may have occurred.

## 2023-11-06 NOTE — Transfer of Care (Signed)
 Immediate Anesthesia Transfer of Care Note  Patient: Adam Lucas  Procedure(s) Performed: COLONOSCOPY WITH PROPOFOL   Patient Location: PACU  Anesthesia Type:General  Level of Consciousness: drowsy  Airway & Oxygen Therapy: Patient Spontanous Breathing and Patient connected to nasal cannula oxygen  Post-op Assessment: Report given to RN and Post -op Vital signs reviewed and stable  Post vital signs: stable  Last Vitals:  Vitals Value Taken Time  BP 110/71 11/06/23 1008  Temp    Pulse 70 11/06/23 1009  Resp 12 11/06/23 1009  SpO2 98 % 11/06/23 1009  Vitals shown include unfiled device data.  Last Pain:  Vitals:   11/06/23 0836  TempSrc: Temporal         Complications: No notable events documented.

## 2023-11-07 LAB — SURGICAL PATHOLOGY

## 2023-12-17 ENCOUNTER — Other Ambulatory Visit: Payer: Self-pay | Admitting: Internal Medicine

## 2023-12-17 DIAGNOSIS — R29818 Other symptoms and signs involving the nervous system: Secondary | ICD-10-CM

## 2023-12-17 DIAGNOSIS — G8929 Other chronic pain: Secondary | ICD-10-CM

## 2023-12-17 DIAGNOSIS — M961 Postlaminectomy syndrome, not elsewhere classified: Secondary | ICD-10-CM

## 2023-12-17 DIAGNOSIS — R29898 Other symptoms and signs involving the musculoskeletal system: Secondary | ICD-10-CM

## 2024-03-19 NOTE — Progress Notes (Signed)
 [facility] Pharmacy Services Encounter   Recommendation  - None   Reason for Encounter  This patient has been identified by report for possible [facility] pharmacy services. Patient flagged as having diabetes associated with SUPD Gap measure who may benefit from statin therapy. Comprehensive chart review completed and patient is already filling ezetimibe -simvastatin  combination pill - last filled 02/03/24 for 100DS. No further action needed at this time   Last HgbA1c: Lab Results  Component Value Date   HGBA1C 6.0 (H) 01/07/2024   HGBA1C 6.2 (H) 07/08/2023    Last ASCVD Risk Score: The ASCVD Risk score (Arnett DK, et al., 2019) failed to calculate for the following reasons:   Risk score cannot be calculated because patient has a medical history suggesting prior/existing ASCVD  Last Lipids: Lab Results  Component Value Date   CHOLTOTAL 119 01/07/2024   LDLCALC 69 01/07/2024   HDL 35.9 01/07/2024   TRIG 69 01/07/2024     Adam Lucas   IMPORTANT: As Adam Lucas's provider, you know this patient best. Please note that these suggestions are not meant to replace your clinical judgment, but to be used in conjunction with your expertise to inform further care.  For more information on [facility] services, click here.

## 2024-04-07 ENCOUNTER — Encounter: Payer: Self-pay | Admitting: Student in an Organized Health Care Education/Training Program

## 2024-04-07 ENCOUNTER — Ambulatory Visit
Attending: Student in an Organized Health Care Education/Training Program | Admitting: Student in an Organized Health Care Education/Training Program

## 2024-04-07 VITALS — BP 130/102 | HR 86 | Temp 98.0°F | Resp 16 | Ht 69.0 in | Wt 190.0 lb

## 2024-04-07 DIAGNOSIS — G8929 Other chronic pain: Secondary | ICD-10-CM | POA: Diagnosis not present

## 2024-04-07 DIAGNOSIS — Z9689 Presence of other specified functional implants: Secondary | ICD-10-CM | POA: Diagnosis not present

## 2024-04-07 DIAGNOSIS — M5416 Radiculopathy, lumbar region: Secondary | ICD-10-CM | POA: Diagnosis present

## 2024-04-07 DIAGNOSIS — M47816 Spondylosis without myelopathy or radiculopathy, lumbar region: Secondary | ICD-10-CM | POA: Diagnosis not present

## 2024-04-07 NOTE — Patient Instructions (Addendum)
 ______________________________________________________________________    Epidural Steroid Injection  An epidural steroid injection is given to relieve pain in your neck, back, or legs that is caused by the irritation or swelling of a nerve root. This procedure involves injecting a steroid and numbing medicine (anesthetic) into the epidural space. The epidural space is the space between the outer covering of your spinal cord and the bones that form your backbone (vertebra).  LET [facility] CARE PROVIDER KNOW ABOUT:  Any allergies you have. All medicines you are taking, including vitamins, herbs, eye drops, creams, and over-the-counter medicines such as aspirin. Previous problems you or members of your family have had with the use of anesthetics. Any blood disorders or blood clotting disorders you have. Previous surgeries you have had. Medical conditions you have.  RISKS AND COMPLICATIONS Generally, this is a safe procedure. However, as with any procedure, complications can occur. Possible complications of epidural steroid injection include: Headache. Bleeding. Infection. Allergic reaction to the medicines. Damage to your nerves. The response to this procedure depends on the underlying cause of the pain and its duration. People who have long-term (chronic) pain are less likely to benefit from epidural steroids than are those people whose pain comes on strong and suddenly.  BEFORE THE PROCEDURE  Ask your health care provider about changing or stopping your regular medicines. You may be advised to stop taking blood-thinning medicines a few days before the procedure. You may be given medicines to reduce anxiety. Arrange for someone to take you home after the procedure.  PROCEDURE  You will remain awake during the procedure. You may receive medicine to make you relaxed. You will be asked to lie on your stomach. The injection site will be cleaned. The injection site will be numbed with a  medicine (local anesthetic). A needle will be injected through your skin into the epidural space. Your health care provider will use an X-ray machine to ensure that the steroid is delivered closest to the affected nerve. You may have minimal discomfort at this time. Once the needle is in the right position, the local anesthetic and the steroid will be injected into the epidural space. The needle will then be removed and a bandage will be applied to the injection site.  AFTER THE PROCEDURE  You may be monitored for a short time before you go home. You may feel weakness or numbness in your arm or leg, which disappears within hours. You may be allowed to eat, drink, and take your regular medicine. You may have soreness at the site of the injection.   This information is not intended to replace advice given to you by your health care provider. Make sure you discuss any questions you have with your health care provider.   Document Released: 09/10/2007 Document Revised: 02/03/2013 Document Reviewed: 11/20/2012 Elsevier Interactive Patient Education 2016 [employer]  GENERAL RISKS AND COMPLICATIONS  What are the risk, side effects and possible complications? Generally speaking, most procedures are safe.  However, with any procedure there are risks, side effects, and the possibility of complications.  The risks and complications are dependent upon the sites that are lesioned, or the type of nerve block to be performed.  The closer the procedure is to the spine, the more serious the risks are.  Great care is taken when placing the radio frequency needles, block needles or lesioning probes, but sometimes complications can occur. Infection: Any time there is an injection through the skin, there is a risk of infection.  This  is why sterile conditions are used for these blocks. There are four possible types of infection: 1. Localized skin infection. 2. Central Nervous System Infection: This can be in  the form of Meningitis, which can be deadly. 3. Epidural Infections: This can be in the form of an epidural abscess, which can cause pressure inside of the spine, causing compression of the spinal cord with subsequent paralysis. This would require an emergency surgery to decompress, and there are no guarantees that the patient would recover from the paralysis. 4. Discitis: This is an infection of the intervertebral discs. It occurs in about 1% of discography procedures. It is difficult to treat and it may lead to surgery. Pain: the needles have to go through skin and soft tissues, will cause soreness. Damage to internal structures:  The nerves to be lesioned may be near blood vessels or other nerves which can be potentially damaged. Bleeding: Bleeding is more common if the patient is taking blood thinners such as  aspirin, Coumadin, Ticiid, Plavix, etc., or if he/she have some genetic predisposition such as hemophilia. Bleeding into the spinal canal can cause compression of the spinal  cord with subsequent paralysis.  This would require an emergency surgery to decompress and there are no guarantees that the patient would recover from the paralysis. Pneumothorax: Puncturing of a lung is a possibility, every time a needle is introduced in the area of the chest or upper back.  Pneumothorax refers to free air around the collapsed lung(s), inside of the thoracic cavity (chest cavity).  Another two possible complications related to a similar event would include: Hemothorax and Chylothorax. These are variations of the Pneumothorax, where instead of air around the collapsed lung(s), you may have blood or chyle, respectively. Spinal headaches: They may occur with any procedures in the area of the spine. Persistent CSF (Cerebro-Spinal Fluid) leakage: This is a rare problem, but may occur with prolonged intrathecal or epidural catheters either due to the formation of a fistulous track or a dural tear. Nerve damage: By  working so close to the spinal cord, there is always a possibility of nerve damage, which could be as serious as a permanent spinal cord injury with paralysis. Death: Although rare, severe deadly allergic reactions known as "Anaphylactic reaction" can occur to any of the medications used. Worsening of the symptoms: We can always make thing worse.  What are the chances of something like this happening? Chances of any of this occuring are extremely low.  By statistics, you have more of a chance of getting killed in a motor vehicle accident: while driving to the hospital than any of the above occurring .  Nevertheless, you should be aware that they are possibilities.  In general, it is similar to taking a shower.  Everybody knows that you can slip, hit your head and get killed.  Does that mean that you should not shower again?  Nevertheless always keep in mind that statistics do not mean anything if you happen to be on the wrong side of them.  Even if a procedure has a 1 (one) in a 1,000,000 (million) chance of going wrong, it you happen to be that one..Also, keep in mind that by statistics, you have more of a chance of having something go wrong when taking medications.  Who should not have this procedure? If you are on a blood thinning medication (e.g. Coumadin, Plavix, see list of "Blood Thinners"), or if you have an active infection going on, you should not have  the procedure.  If you are taking any blood thinners, please inform your physician.  Preparing for your procedure: Do not eat or drink anything at least eight (8) hours prior to the procedure. Bring a driver with you .  It cannot be a taxi. Come accompanied by an adult that can drive you back, and that is strong enough to help you if your legs get weak or numb from the local anesthetic. Take all of your medicines the morning of the procedure with just enough water to swallow them. If you have diabetes, make sure that you are scheduled to have  your procedure done first thing in the morning, whenever possible. If you have diabetes, take only half of your insulin dose and notify our nurse that you have done so as soon as you arrive at the clinic. If you are diabetic, but only take blood sugar pills (oral hypoglycemic), then do not take them on the morning of your procedure.  You may take them after you have had the procedure. Do not take aspirin or any aspirin-containing medications, at least eleven (11) days prior to the procedure.  They may prolong bleeding. Wear loose fitting clothing that may be easy to take off and that you would not mind if it got stained with Betadine or blood. Do not wear any jewelry or perfume Remove any nail coloring.  It will interfere with some of our monitoring equipment. If you take Metformin for your diabetes, stop it 48 hours prior to the procedure.  NOTE: Remember that this is not meant to be interpreted as a complete list of all possible complications.  Unforeseen problems may occur.  BLOOD THINNERS The following drugs contain aspirin or other products, which can cause increased bleeding during surgery and should not be taken for 2 weeks prior to and 1 week after surgery.  If you should need take something for relief of minor pain, you may take acetaminophen which is found in Tylenol,m Datril, Anacin-3 and Panadol. It is not blood thinner. The products listed below are.  Do not take any of the products listed below in addition to any listed on your instruction sheet.  A.P.C or A.P.C with Codeine Codeine Phosphate Capsules #3 Ibuprofen Ridaura  ABC compound Congesprin Imuran rimadil  Advil Cope Indocin Robaxisal  Alka-Seltzer Effervescent Pain Reliever and Antacid Coricidin or Coricidin-D  Indomethacin Rufen  Alka-Seltzer plus Cold Medicine Cosprin Ketoprofen S-A-C Tablets  Anacin Analgesic Tablets or Capsules Coumadin Korlgesic Salflex  Anacin Extra Strength Analgesic tablets or capsules CP-2 Tablets  Lanoril Salicylate  Anaprox Cuprimine Capsules Levenox Salocol  Anexsia-D Dalteparin Magan Salsalate  Anodynos Darvon compound Magnesium Salicylate Sine-off  Ansaid Dasin Capsules Magsal Sodium Salicylate  Anturane Depen Capsules Marnal Soma  APF Arthritis pain formula Dewitt's Pills Measurin Stanback  Argesic Dia-Gesic Meclofenamic Sulfinpyrazone  Arthritis Bayer Timed Release Aspirin Diclofenac Meclomen Sulindac  Arthritis pain formula Anacin Dicumarol Medipren Supac  Analgesic (Safety coated) Arthralgen Diffunasal Mefanamic Suprofen  Arthritis Strength Bufferin Dihydrocodeine Mepro Compound Suprol  Arthropan liquid Dopirydamole Methcarbomol with Aspirin Synalgos  ASA tablets/Enseals Disalcid Micrainin Tagament  Ascriptin Doan's Midol Talwin  Ascriptin A/D Dolene Mobidin Tanderil  Ascriptin Extra Strength Dolobid Moblgesic Ticlid  Ascriptin with Codeine Doloprin or Doloprin with Codeine Momentum Tolectin  Asperbuf Duoprin Mono-gesic Trendar  Aspergum Duradyne Motrin or Motrin IB Triminicin  Aspirin plain, buffered or enteric coated Durasal Myochrisine Trigesic  Aspirin Suppositories Easprin Nalfon Trillsate  Aspirin with Codeine Ecotrin Regular or Extra Strength Naprosyn Uracel  Atromid-S Efficin Naproxen Ursinus  Auranofin Capsules Elmiron Neocylate Vanquish  Axotal Emagrin Norgesic Verin  Azathioprine Empirin or Empirin with Codeine Normiflo Vitamin E  Azolid Emprazil Nuprin Voltaren  Bayer Aspirin plain, buffered or children's or timed BC Tablets or powders Encaprin Orgaran Warfarin Sodium  Buff-a-Comp Enoxaparin Orudis Zorpin  Buff-a-Comp with Codeine Equegesic Os-Cal-Gesic   Buffaprin Excedrin plain, buffered or Extra Strength Oxalid   Bufferin Arthritis Strength Feldene Oxphenbutazone   Bufferin plain or Extra Strength Feldene Capsules Oxycodone with Aspirin   Bufferin with Codeine Fenoprofen Fenoprofen Pabalate or Pabalate-SF   Buffets II Flogesic Panagesic   Buffinol  plain or Extra Strength Florinal or Florinal with Codeine Panwarfarin   Buf-Tabs Flurbiprofen Penicillamine   Butalbital Compound Four-way cold tablets Penicillin   Butazolidin Fragmin Pepto-Bismol   Carbenicillin Geminisyn Percodan   Carna Arthritis Reliever Geopen Persantine   Carprofen Gold's salt Persistin   Chloramphenicol Goody's Phenylbutazone   Chloromycetin Haltrain Piroxlcam   Clmetidine heparin Plaquenil   Cllnoril Hyco-pap Ponstel   Clofibrate Hydroxy chloroquine Propoxyphen         Before stopping any of these medications, be sure to consult the physician who ordered them.  Some, such as Coumadin (Warfarin) are ordered to prevent or treat serious conditions such as "deep thrombosis", "pumonary embolisms", and other heart problems.  The amount of time that you may need off of the medication may also vary with the medication and the reason for which you were taking it.  If you are taking any of these medications, please make sure you notify your pain physician before you undergo any procedures. ______________________________________________________________________   ______________________________________________________________________    Preparing for your procedure  Appointments: If you think you may not be able to keep your appointment, call [phone] hours in advance to cancel. We need time to make it available to others.  Procedure visits are for procedures only. During your procedure appointment there will be: NO Prescription Refills*. NO medication changes or discussions*. NO discussion of disability issues*. NO unrelated pain problem evaluations*. NO evaluations to order other pain procedures*. *These will be addressed at a separate and distinct evaluation encounter on the provider's evaluation schedule and not during procedure days.  Instructions: Food intake: Avoid eating anything solid for at least 8 hours prior to your procedure. Clear liquid intake: You may take  clear liquids such as water up to 2 hours prior to your procedure. (No carbonated drinks. No soda.) Transportation: Unless otherwise stated by your physician, bring a driver. (Driver cannot be a Market researcher, Pharmacist, community, or any other form of public transportation.) Morning Medicines: Except for blood thinners, take all of your other morning medications with a sip of water. Make sure to take your heart and blood pressure medicines. If your blood pressure's lower number is above 100, the case will be rescheduled. Blood thinners: Make sure to stop your blood thinners as instructed.  If you take a blood thinner, but were not instructed to stop it, call our office [phone] and ask to talk to a nurse. Not stopping a blood thinner prior to certain procedures could lead to serious complications. Diabetics on insulin: Notify the staff so that you can be scheduled 1st case in the morning. If your diabetes requires high dose insulin, take only  of your normal insulin dose the morning of the procedure and notify the staff that you have done so. Preventing infections: Shower with an antibacterial soap the morning of your procedure.  Build-up your immune system: Take 1000  mg of Vitamin C with every meal (3 times a day) the day prior to your procedure. Antibiotics: Inform the nursing staff if you are taking any antibiotics or if you have any conditions that may require antibiotics prior to procedures. (Example: recent joint implants)   Pregnancy: If you are pregnant make sure to notify the nursing staff. Not doing so may result in injury to the fetus, including death.  Sickness: If you have a cold, fever, or any active infections, call and cancel or reschedule your procedure. Receiving steroids while having an infection may result in complications. Arrival: You must be in the facility at least 30 minutes prior to your scheduled procedure. Tardiness: Your scheduled time is also the cutoff time. If you do not arrive at least 15  minutes prior to your procedure, you will be rescheduled.  Children: Do not bring any children with you. Make arrangements to keep them home. Dress appropriately: There is always a possibility that your clothing may get soiled. Avoid long dresses. Valuables: Do not bring any jewelry or valuables.  Reasons to call and reschedule or cancel your procedure: (Following these recommendations will minimize the risk of a serious complication.) Surgeries: Avoid having procedures within 2 weeks of any surgery. (Avoid for 2 weeks before or after any surgery). Flu Shots: Avoid having procedures within 2 weeks of a flu shots or . (Avoid for 2 weeks before or after immunizations). Barium: Avoid having a procedure within 7-10 days after having had a radiological study involving the use of radiological contrast. (Myelograms, Barium swallow or enema study). Heart attacks: Avoid any elective procedures or surgeries for the initial 6 months after a "Myocardial Infarction" (Heart Attack). Blood thinners: It is imperative that you stop these medications before procedures. Let us know if you if you take any blood thinner.  Infection: Avoid procedures during or within two weeks of an infection (including chest colds or gastrointestinal problems). Symptoms associated with infections include: Localized redness, fever, chills, night sweats or profuse sweating, burning sensation when voiding, cough, congestion, stuffiness, runny nose, sore throat, diarrhea, nausea, vomiting, cold or Flu symptoms, recent or current infections. It is specially important if the infection is over the area that we intend to treat. Heart and lung problems: Symptoms that may suggest an active cardiopulmonary problem include: cough, chest pain, breathing difficulties or shortness of breath, dizziness, ankle swelling, uncontrolled high or unusually low blood pressure, and/or palpitations. If you are experiencing any of these symptoms, cancel your procedure  and contact your primary care physician for an evaluation.  Remember:  Regular Business hours are:  Monday to Thursday 8:00 AM to 4:00 PM  Provider's Schedule: Delano Metz, MD:  Procedure days: Tuesday and Thursday 7:30 AM to 4:00 PM  Edward Jolly, MD:  Procedure days: Monday and Wednesday 7:30 AM to 4:00 PM Last  Updated: 05/27/2023 ______________________________________________________________________

## 2024-04-07 NOTE — Progress Notes (Signed)
 PROVIDER NOTE: Interpretation of information contained herein should be left to medically-trained personnel. Specific patient instructions are provided elsewhere under Patient Instructions section of medical record. This document was created in part using AI and STT-dictation technology, any transcriptional errors that may result from this process are unintentional.  Patient: Adam Lucas  Service: E/M   PCP: Sadie Manna, MD  DOB: 06/26/1950  DOS: 04/07/2024  Provider: Wallie Sherry, MD  MRN: [id number]  Delivery: Face-to-face  Specialty: Interventional Pain Management  Type: Established Patient  Setting: Ambulatory outpatient facility  Specialty designation: 09  Referring Prov.: Avanell Katz, MD  Location: Outpatient office facility       History of present illness (HPI) Adam Lucas, a 73 y.o. year old male, is here today because of his Lumbar radiculopathy [M54.16]. Adam Lucas primary complain today is Hip Pain (right)  Pertinent problems: Adam Lucas does not have any pertinent problems on file.  Pain Assessment: Severity of Chronic pain is reported as a 10-Worst pain ever/10. Location: Hip Right, Left/hips/buttocks down back of legs to knees. Onset: More than a month ago. Quality: Aching, Stabbing, Shooting, Throbbing. Timing: Intermittent. Modifying factor(s): rest. Vitals:  height is 5' 9 (1.753 m) and weight is 190 lb (86.2 kg). His temperature is 98 F (36.7 C). His blood pressure is 130/102 (abnormal) and his pulse is 86. His respiration is 16 and oxygen saturation is 98%.  BMI: Estimated body mass index is 28.06 kg/m as calculated from the following:   Height as of this encounter: 5' 9 (1.753 m).   Weight as of this encounter: 190 lb (86.2 kg).  Last encounter: Visit date not found. Last procedure: Visit date not found.  Reason for encounter:  Patient's last visit with me was 06/09/2019.  This was for a caudal ESI.  After this he went on to have a spinal cord  stimulator placed with Dr. Daril at [facility].  The spinal cord stimulator was initially helping but has become less effective over the last year.SABRA  He is now complaining of acute on chronic low back pain with radiation to the bilateral buttocks, posterior thighs into his knees.  He has a history of L3-S1 fusion.  Standing and walking worsens his pain.  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  He saw Dr. Avanell with physical medicine and rehab.  Dr. Avanell recommended the patient come back and see me for consideration of a caudal ESI.  ROS  Constitutional: Denies any fever or chills Gastrointestinal: No reported hemesis, hematochezia, vomiting, or acute GI distress Musculoskeletal: Low back pain with radiation into bilateral legs Neurological: No reported episodes of acute onset apraxia, aphasia, dysarthria, agnosia, amnesia, paralysis, loss of coordination, or loss of consciousness  Medication Review  Turmeric Curcumin, ascorbic acid , aspirin , carvedilol , celecoxib , citalopram, cyanocobalamin , ergocalciferol , ezetimibe -simvastatin , magnesium  oxide, nitroGLYCERIN , olmesartan, traMADol , vitamin B-1, and zolpidem   History Review  Allergy: Adam Lucas is allergic to pravastatin , atorvastatin , and crestor [rosuvastatin calcium ]. Drug: Adam Lucas  reports no history of drug use. Alcohol:  reports that he does not currently use alcohol. Tobacco:  reports that he has never smoked. He has never used smokeless tobacco. Social: Adam Lucas  reports that he has never smoked. He has never used smokeless tobacco. He reports that he does not currently use alcohol. He reports that he does not use drugs. Medical:  has a past medical history of Bilateral leg weakness, Carpal tunnel syndrome, Chronic lower back pain, Coronary artery disease, Degenerative arthritis, Depression, ED (erectile  dysfunction), Heart murmur (~ 1958), High cholesterol, History of kidney stones, Hypertension, Hypogonadism in male, Major  depressive disorder, Migraine, Myocardial infarction (HCC) (07/17/2014), Neuropathic pain of lower extremity, Neuropathy, Polyneuropathy, and Seasonal allergies. Surgical: Adam Lucas  has a past surgical history that includes left heart catheterization with coronary angiogram (N/A, 07/17/2014); Coronary angioplasty with stent (07/17/2014; 08/09/2014); Tonsillectomy and adenoidectomy (1970'S); Carpal tunnel release (Right, 2015); Knee arthroscopy (Left, ~ 2002); Posterior lumbar fusion 4 level (2005); ORIF finger / thumb fracture (Right, ~ 2012); Hand ligament reconstruction (Right, ~ 2013); Extracorporeal shock wave lithotripsy (2000's X 1); Fracture surgery; coronary angiogram (08/09/2014); percutaneous coronary stent intervention (pci-s) (08/09/2014); Colonoscopy with propofol  (N/A, 11/02/2015); Back surgery; Total hip arthroplasty (Left, 10/20/2017); Hip surgery (Left); Joint replacement (Left); Carpal tunnel release (Left, 06/23/2019); Eye surgery; SPINAL NEUROSTIMULATOR (N/A); Cataract extraction (N/A); Right ear surgery (Right); and Colonoscopy with propofol  (N/A, 11/06/2023). Family: family history includes Deep vein thrombosis in his mother; Heart attack in his father; Parkinson's disease in his father.  Laboratory Chemistry Profile   Renal Lab Results  Component Value Date   BUN 26 (H) 10/05/2022   CREATININE 1.20 10/05/2022   GFRAA >60 10/08/2017   GFRNONAA >60 10/05/2022    Hepatic Lab Results  Component Value Date   AST 21 10/05/2022   ALT 26 10/05/2022   ALBUMIN 3.9 10/05/2022   ALKPHOS 44 10/05/2022    Electrolytes Lab Results  Component Value Date   NA 138 10/05/2022   K 4.5 10/05/2022   CL 105 10/05/2022   CALCIUM  9.4 10/05/2022    Bone No results found for: VD25OH, VD125OH2TOT, CI6874NY7, CI7874NY7, 25OHVITD1, 25OHVITD2, 25OHVITD3, TESTOFREE, TESTOSTERONE  Inflammation (CRP: Acute Phase) (ESR: Chronic Phase) Lab Results  Component Value Date   CRP <0.8  10/08/2017   ESRSEDRATE 1 10/08/2017         Note: Above Lab results reviewed.  Recent Imaging Review  CT HEAD WO CONTRAST CLINICAL DATA:  Headache, numbness  EXAM: CT HEAD WITHOUT CONTRAST  TECHNIQUE: Contiguous axial images were obtained from the base of the skull through the vertex without intravenous contrast.  RADIATION DOSE REDUCTION: This exam was performed according to the departmental dose-optimization program which includes automated exposure control, adjustment of the mA and/or kV according to patient size and/or use of iterative reconstruction technique.  COMPARISON:  None Available.  FINDINGS: Brain: Mild age related volume loss. No acute intracranial abnormality. Specifically, no hemorrhage, hydrocephalus, mass lesion, acute infarction, or significant intracranial injury.  Vascular: No hyperdense vessel or unexpected calcification.  Skull: No acute calvarial abnormality.  Sinuses/Orbits: No acute findings  Other: None  IMPRESSION: No acute intracranial abnormality.  Electronically Signed   By: Franky Crease M.D.   On: 10/05/2022 18:18   Findings: Transitional lumbosacral anatomy is present. There are 4 nonrib-bearing lumbar-type vertebral bodies. Posterior instrumented fusion from L2 to S1 with interbody graft at L4-S1. No evidence of acute hardware complication. Solid osseous fusion is present across the posterior elements at these levels. Spinal stimulator leads, superior extent not included in the field-of-view. No acute fracture or traumatic subluxation. Vertebral body heights are maintained. Severe facet arthropathy at L1-L2. No severe spinal canal or neuroforaminal stenosis via productive osseous change. 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 Left adrenal nodule measuring 1.4 cm (series 2 image 20), similar to the MRI lumbar spine from 02/26/2019, possibly representing an adrenal  adenoma.  Impression:  Transitional lumbosacral anatomy is present. There are 4 nonrib-bearing lumbar-type  vertebral bodies. Posterior instrumented fusion from L2 to S1 with interbody graft at L4-S1. No evidence of acute hardware complication.  No severe spinal canal or neuroforaminal stenosis via productive osseous change.  Note: Reviewed        Physical Exam  Vitals: BP (!) 130/102   Pulse 86   Temp 98 F (36.7 C)   Resp 16   Ht 5' 9 (1.753 m)   Wt 190 lb (86.2 kg)   SpO2 98%   BMI 28.06 kg/m  BMI: Estimated body mass index is 28.06 kg/m as calculated from the following:   Height as of this encounter: 5' 9 (1.753 m).   Weight as of this encounter: 190 lb (86.2 kg). Ideal: Ideal body weight: 70.7 kg (155 lb 13.8 oz) Adjusted ideal body weight: 76.9 kg (169 lb 8.3 oz) General appearance: Well nourished, well developed, and well hydrated. In no apparent acute distress Mental status: Alert, oriented x 3 (person, place, & time)       Respiratory: No evidence of acute respiratory distress Eyes: PERLA Lumbar Spine Area Exam  Skin & Axial Inspection: Well healed scar from previous spine surgery detected Alignment: Symmetrical Functional ROM: Pain restricted ROM affecting primarily the right Stability: No instability detected Muscle Tone/Strength: Functionally intact. No obvious neuro-muscular anomalies detected. Sensory (Neurological): Dermatomal pain pattern and musculoskeletal Palpation: No palpable anomalies        Gait & Posture Assessment  Ambulation: Unassisted Gait: Relatively normal for age and body habitus Posture: WNL  Lower Extremity Exam    Side: Right lower extremity  Side: Left lower extremity  Stability: No instability observed          Stability: No instability observed          Skin & Extremity Inspection: Skin color, temperature, and hair growth are WNL. No peripheral edema or cyanosis. No masses, redness, swelling, asymmetry, or associated skin lesions.  No contractures.  Skin & Extremity Inspection: Skin color, temperature, and hair growth are WNL. No peripheral edema or cyanosis. No masses, redness, swelling, asymmetry, or associated skin lesions. No contractures.  Functional ROM: Unrestricted ROM                  Functional ROM: Unrestricted ROM                  Muscle Tone/Strength: Functionally intact. No obvious neuro-muscular anomalies detected.  Muscle Tone/Strength: Functionally intact. No obvious neuro-muscular anomalies detected.  Sensory (Neurological): Neurogenic pain pattern        Sensory (Neurological): Neurogenic pain pattern        DTR: Patellar: deferred today Achilles: deferred today Plantar: deferred today  DTR: Patellar: deferred today Achilles: deferred today Plantar: deferred today  Palpation: No palpable anomalies  Palpation: No palpable anomalies    Assessment   Diagnosis Status  1. Lumbar radiculopathy   2. Chronic radicular lumbar pain   3. Lumbar facet arthropathy   4. Spinal cord stimulator status    Having a Flare-up Having a Flare-up Having a Flare-up   Updated Problems: Problem  Lumbar Facet Arthropathy  Spinal Cord Stimulator Status  Lumbar Radiculopathy    Plan of Care  Adam Lucas with hx of prior lumbar fusion and spinal cord stimulator ([employer], implanted 2020 Dr Daril Hails) presents with recurrent low back pain radiating into both legs in a dermatomal pattern, consistent with lumbar radiculopathy. He was evaluated by PM&R (Dr. Alphonso referred to consider repeat epidural therapy. I reviewed his recent CT with  him; given the symptom distribution and post-surgical anatomy, a caudal epidural steroid injection (Caudal ESI) is a reasonable next step to reduce nerve-root inflammation and improve function. Risks, benefits, and alternatives were reviewed, including infection, bleeding, dural puncture/headache, transient numbness or weakness, steroid-related effects (transient hyperglycemia, insomnia,  flushing), and very rare neurologic injury; the patient wishes to proceed. In parallel, I contacted [employer] to have their representative reach out to the patient to optimize his SCS settings, as improved programming may provide additional relief  Orders:  Orders Placed This Encounter  Procedures   Caudal Epidural Injection    Standing Status:   Future    Expiration Date:   07/08/2024    Scheduling Instructions:     Laterality: Midline     Level(s): Sacrococcygeal canal (Tailbone area)     Sedation: Patient's choice     Scheduling Timeframe: As soon as pre-approved    Where will this procedure be performed?:   [facility]    Return in about 12 days (around 04/19/2024) for Caudal (midline), in clinic [facility].    Recent Visits No visits were found meeting these conditions. Showing recent visits within past 90 days and meeting all other requirements Today's Visits Date Type Provider Dept  04/07/24 Office Visit Marcelino Nurse, MD [facility]  Showing today's visits and meeting all other requirements Future Appointments No visits were found meeting these conditions. Showing future appointments within next 90 days and meeting all other requirements  I discussed the assessment and treatment plan with the patient. The patient was provided an opportunity to ask questions and all were answered. The patient agreed with the plan and demonstrated an understanding of the instructions.  Patient advised to call back or seek an in-person evaluation if the symptoms or condition worsens.  I personally spent a total of 30 minutes in the care of the patient today including preparing to see the patient, getting/reviewing separately obtained history, performing a medically appropriate exam/evaluation, counseling and educating, placing orders, and documenting clinical information in the EHR.   Note by: Nurse Marcelino, MD (TTS and AI technology used. I apologize for any typographical errors that  were not detected and corrected.) Date: 04/07/2024; Time: 2:54 PM

## 2024-04-07 NOTE — Progress Notes (Signed)
 Safety precautions to be maintained throughout the outpatient stay will include: orient to surroundings, keep bed in low position, maintain call bell within reach at all times, provide assistance with transfer out of bed and ambulation.

## 2024-04-19 ENCOUNTER — Ambulatory Visit: Admitting: Student in an Organized Health Care Education/Training Program

## 2024-05-05 ENCOUNTER — Ambulatory Visit: Admitting: Student in an Organized Health Care Education/Training Program

## 2024-06-14 ENCOUNTER — Ambulatory Visit
Admission: RE | Admit: 2024-06-14 | Discharge: 2024-06-14 | Disposition: A | Discharge status: Home / Self Care | Source: Ambulatory Visit | Attending: Student in an Organized Health Care Education/Training Program | Admitting: Student in an Organized Health Care Education/Training Program

## 2024-06-14 ENCOUNTER — Ambulatory Visit (HOSPITAL_BASED_OUTPATIENT_CLINIC_OR_DEPARTMENT_OTHER): Admitting: Student in an Organized Health Care Education/Training Program

## 2024-06-14 ENCOUNTER — Encounter: Payer: Self-pay | Admitting: Student in an Organized Health Care Education/Training Program

## 2024-06-14 VITALS — BP 166/100 | HR 93 | Temp 97.2°F | Resp 17 | Ht 69.0 in | Wt 200.0 lb

## 2024-06-14 DIAGNOSIS — M5416 Radiculopathy, lumbar region: Secondary | ICD-10-CM | POA: Insufficient documentation

## 2024-06-14 DIAGNOSIS — G8929 Other chronic pain: Secondary | ICD-10-CM | POA: Insufficient documentation

## 2024-06-14 MED ORDER — LIDOCAINE HCL (PF) 2 % IJ SOLN
INTRAMUSCULAR | Status: AC
  Filled 2024-06-14: qty 10

## 2024-06-14 MED ORDER — IOHEXOL 180 MG/ML  SOLN
INTRAMUSCULAR | Status: AC
  Filled 2024-06-14: qty 20

## 2024-06-14 MED ORDER — IOHEXOL 180 MG/ML  SOLN
10.0000 mL | Freq: Once | INTRAMUSCULAR | Status: AC
  Administered 2024-06-14: 10 mL via EPIDURAL

## 2024-06-14 MED ORDER — SODIUM CHLORIDE 0.9% FLUSH
2.0000 mL | Freq: Once | INTRAVENOUS | Status: AC
  Administered 2024-06-14: 2 mL

## 2024-06-14 MED ORDER — SODIUM CHLORIDE (PF) 0.9 % IJ SOLN
INTRAMUSCULAR | Status: AC
  Filled 2024-06-14: qty 10

## 2024-06-14 MED ORDER — ROPIVACAINE HCL 2 MG/ML IJ SOLN
INTRAMUSCULAR | Status: AC
  Filled 2024-06-14: qty 20

## 2024-06-14 MED ORDER — LIDOCAINE HCL 2 % IJ SOLN
20.0000 mL | Freq: Once | INTRAMUSCULAR | Status: AC
  Administered 2024-06-14: 200 mg

## 2024-06-14 MED ORDER — ROPIVACAINE HCL 2 MG/ML IJ SOLN
2.0000 mL | Freq: Once | INTRAMUSCULAR | Status: AC
  Administered 2024-06-14: 2 mL via EPIDURAL

## 2024-06-14 MED ORDER — DEXAMETHASONE SOD PHOSPHATE PF 10 MG/ML IJ SOLN
10.0000 mg | Freq: Once | INTRAMUSCULAR | Status: AC
  Administered 2024-06-14: 10 mg

## 2024-06-14 NOTE — Patient Instructions (Signed)

## 2024-06-14 NOTE — Progress Notes (Signed)
 Patient's Name: Adam Lucas  MRN: [id number]  Referring Provider: Sadie Manna, MD  DOB: [birth year]  PCP: Sadie Manna, MD  DOS: 06/14/2024  Note by: Wallie Sherry, MD  Service setting: Ambulatory outpatient  Specialty: Interventional Pain Management  Patient type: Established  Location: [facility] (AMB) [facility]  Visit type: Interventional Procedure   Primary Reason for Visit: Interventional Pain Management Treatment. CC: back pain and leg pain  Procedure:          Anesthesia, Analgesia, Anxiolysis:  Type: Therapeutic Epidural Steroid Injection  (Previously done 2020) Region: Caudal Level: Sacrococcygeal   Laterality: Midline       Type: Local Anesthesia  Local Anesthetic: Lidocaine  1-2%  Position: Prone   Indications: 1. Lumbar radiculopathy   2. Chronic radicular lumbar pain     Pain Score: Pre-procedure: 6 /10 Post-procedure: 5 /10   Pre-op Assessment:  Mr. Dase is a 73 y.o. (year old), male patient, seen today for interventional treatment. He  has a past surgical history that includes left heart catheterization with coronary angiogram (N/A, 07/17/2014); Coronary angioplasty with stent (07/17/2014; 08/09/2014); Tonsillectomy and adenoidectomy (1970'S); Carpal tunnel release (Right, 2015); Knee arthroscopy (Left, ~ 2002); Posterior lumbar fusion 4 level (2005); ORIF finger / thumb fracture (Right, ~ 2012); Hand ligament reconstruction (Right, ~ 2013); Extracorporeal shock wave lithotripsy (2000's X 1); Fracture surgery; coronary angiogram (08/09/2014); percutaneous coronary stent intervention (pci-s) (08/09/2014); Colonoscopy with propofol  (N/A, 11/02/2015); Back surgery; Total hip arthroplasty (Left, 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 Mr. Buttery has a current medication list which includes the following prescription(s): aspirin , carvedilol , celecoxib , citalopram, cyanocobalamin , ergocalciferol , ezetimibe -simvastatin , magnesium  oxide, olmesartan, vitamin b-1, tramadol , ascorbic acid , zolpidem , nitroglycerin , and turmeric curcumin. His primarily concern today is the Hip Pain (bilat) and Leg Pain (bilat)  Initial Vital Signs:  Pulse/HCG Rate: 93ECG Heart Rate: 97 Temp: (!) 97.2 F (36.2 C) Resp: 16 BP: (!) 145/92 SpO2: 99 %  BMI: Estimated body mass index is 29.53 kg/m as calculated from the following:   Height as of this encounter: 5' 9 (1.753 m).   Weight as of this encounter: 200 lb (90.7 kg).  Risk Assessment: Allergies: Reviewed. He is allergic to pravastatin , atorvastatin , and crestor [rosuvastatin calcium ].  Allergy Precautions: None required Coagulopathies: Reviewed. None identified.  Blood-thinner therapy: None at this time Active Infection(s): Reviewed. None identified. Mr. Cypress is afebrile  Site Confirmation: Mr. Ambs was asked to confirm the procedure and laterality before marking the site Procedure checklist: Completed Consent: Before the procedure and under the influence of no sedative(s), amnesic(s), or anxiolytics, the patient was informed of the treatment options, risks and possible complications. To fulfill our ethical and legal obligations, as recommended by the American Medical Association's Code of Ethics, I have informed the patient of my clinical impression; the nature and purpose of the treatment or procedure; the risks, benefits, and possible complications of the intervention; the alternatives, including doing nothing; the risk(s) and benefit(s) of the alternative treatment(s) or procedure(s); and the risk(s) and benefit(s) of doing nothing. The patient was provided information about the general risks and possible complications associated with the procedure. These may include, but are  not limited to: failure to achieve desired goals, infection, bleeding, organ or nerve damage, allergic reactions, paralysis, and death. In addition, the patient was informed of those risks and complications associated  to Spine-related procedures, such as failure to decrease pain; infection (i.e.: Meningitis, epidural or intraspinal abscess); bleeding (i.e.: epidural hematoma, subarachnoid hemorrhage, or any other type of intraspinal or peri-dural bleeding); organ or nerve damage (i.e.: Any type of peripheral nerve, nerve root, or spinal cord injury) with subsequent damage to sensory, motor, and/or autonomic systems, resulting in permanent pain, numbness, and/or weakness of one or several areas of the body; allergic reactions; (i.e.: anaphylactic reaction); and/or death. Furthermore, the patient was informed of those risks and complications associated with the medications. These include, but are not limited to: allergic reactions (i.e.: anaphylactic or anaphylactoid reaction(s)); adrenal axis suppression; blood sugar elevation that in diabetics may result in ketoacidosis or comma; water retention that in patients with history of congestive heart failure may result in shortness of breath, pulmonary edema, and decompensation with resultant heart failure; weight gain; swelling or edema; medication-induced neural toxicity; particulate matter embolism and blood vessel occlusion with resultant organ, and/or nervous system infarction; and/or aseptic necrosis of one or more joints. Finally, the patient was informed that Medicine is not an exact science; therefore, there is also the possibility of unforeseen or unpredictable risks and/or possible complications that may result in a catastrophic outcome. The patient indicated having understood very clearly. We have given the patient no guarantees and we have made no promises. Enough time was given to the patient to ask questions, all of which were answered to the patient's  satisfaction. Mr. Gorka has indicated that he wanted to continue with the procedure. Attestation: I, the ordering provider, attest that I have discussed with the patient the benefits, risks, side-effects, alternatives, likelihood of achieving goals, and potential problems during recovery for the procedure that I have provided informed consent. Date  Time:   Pre-Procedure Preparation:  Monitoring: As per clinic protocol. Respiration, ETCO2, SpO2, BP, heart rate and rhythm monitor placed and checked for adequate function Safety Precautions: Patient was assessed for positional comfort and pressure points before starting the procedure. Time-out: I initiated and conducted the Time-out before starting the procedure, as per protocol. The patient was asked to participate by confirming the accuracy of the Time Out information. Verification of the correct person, site, and procedure were performed and confirmed by me, the nursing staff, and the patient. Time-out conducted as per Joint Commission's Universal Protocol (UP.01.01.01). Time: 0933  Description of Procedure:          Target Area: Caudal Epidural Canal. Approach: Midline approach. Area Prepped: Entire Posterior Sacrococcygeal Region Prepping solution: DuraPrep (Iodine Povacrylex [0.7% available iodine] and Isopropyl Alcohol, 74% w/w) Safety Precautions: Aspiration looking for blood return was conducted prior to all injections. At no point did we inject any substances, as a needle was being advanced. No attempts were made at seeking any paresthesias. Safe injection practices and needle disposal techniques used. Medications properly checked for expiration dates. SDV (single dose vial) medications used. Description of the Procedure: Protocol guidelines were followed. The patient was placed in position over the fluoroscopy table. The target area was identified and the area prepped in the usual manner. Skin & deeper tissues infiltrated with local  anesthetic. Appropriate amount of time allowed to pass for local anesthetics to take effect. The procedure needles were then advanced to the target area. Proper needle placement secured. Negative aspiration confirmed. Solution injected in intermittent fashion, asking for systemic symptoms every 0.5cc of injectate. The needles were then removed and the area cleansed, making sure to leave some of the prepping solution back to take advantage  of its long term bactericidal properties. Vitals:   06/14/24 0928 06/14/24 0931 06/14/24 0936 06/14/24 0943  BP: (!) 156/102 (!) 158/109 (!) 151/115 (!) 166/100  Pulse:      Resp: 20 15 17    Temp:      TempSrc:      SpO2: 99% 96% 98%   Weight:      Height:        Start Time: 0933 hrs. End Time: 0935 hrs. Materials:  Needle(s) Type: Epidural needle Gauge: 22G Length: 3.5-in Medication(s): Please see orders for medications and dosing details. 6cc solution made of 2 cc of preservative-free saline, 3 cc of 0.2% ropivacaine , 1 cc of Decadron  10 mg/cc. Imaging Guidance (Spinal):          Type of Imaging Technique: Fluoroscopy Guidance (Spinal) Indication(s): Assistance in needle guidance and placement for procedures requiring needle placement in or near specific anatomical locations not easily accessible without such assistance. Exposure Time: Please see nurses notes. Contrast: Before injecting any contrast, we confirmed that the patient did not have an allergy to iodine, shellfish, or radiological contrast. Once satisfactory needle placement was completed at the desired level, radiological contrast was injected. Contrast injected under live fluoroscopy. No contrast complications. See chart for type and volume of contrast used. Fluoroscopic Guidance: I was personally present during the use of fluoroscopy. Tunnel Vision Technique used to obtain the best possible view of the target area. Parallax error corrected before commencing the procedure.  Direction-depth-direction technique used to introduce the needle under continuous pulsed fluoroscopy. Once target was reached, antero-posterior, oblique, and lateral fluoroscopic projection used confirm needle placement in all planes. Images permanently stored in EMR. Interpretation: I personally interpreted the imaging intraoperatively. Adequate needle placement confirmed in multiple planes. Appropriate spread of contrast into desired area was observed. No evidence of afferent or efferent intravascular uptake. No intrathecal or subarachnoid spread observed. Permanent images saved into the patient's record.  Antibiotic Prophylaxis:   Anti-infectives (From admission, onward)    None      Indication(s): None identified  Post-operative Assessment:  Post-procedure Vital Signs:  Pulse/HCG Rate: 9386 Temp: (!) 97.2 F (36.2 C) Resp: 17 BP: (!) 166/100 (sitting.does not take BP medicine, denies headache or blurred vision.) SpO2: 98 %  EBL: None  Complications: No immediate post-treatment complications observed by team, or reported by patient.  Note: The patient tolerated the entire procedure well. A repeat set of vitals were taken after the procedure and the patient was kept under observation following institutional policy, for this type of procedure. Post-procedural neurological assessment was performed, showing return to baseline, prior to discharge. The patient was provided with post-procedure discharge instructions, including a section on how to identify potential problems. Should any problems arise concerning this procedure, the patient was given instructions to immediately contact us , at any time, without hesitation. In any case, we plan to contact the patient by telephone for a follow-up status report regarding this interventional procedure.  Comments:  No additional relevant information. 5 out of 5 strength bilateral lower extremity: Plantar flexion, dorsiflexion, knee flexion, knee  extension.  Plan of Care  Orders:  Orders Placed This Encounter  Procedures   DG PAIN CLIN[facility] C-ARM 1-60 MIN NO REPORT    Intraoperative interpretation by procedural physician at [facility].    Standing Status:   Standing    Number of Occurrences:   1    Reason for exam::   Assistance in needle guidance and placement for procedures requiring needle placement  in or near specific anatomical locations not easily accessible without such assistance.   Medications ordered for procedure: Meds ordered this encounter  Medications   iohexol  (OMNIPAQUE ) 180 MG/ML injection 10 mL    Must be Myelogram-compatible. If not available, you may substitute with a water-soluble, non-ionic, hypoallergenic, myelogram-compatible radiological contrast medium.   lidocaine  (XYLOCAINE ) 2 % (with pres) injection 400 mg   ropivacaine  (PF) 2 mg/mL (0.2%) (NAROPIN ) injection 2 mL   sodium chloride  flush (NS) 0.9 % injection 2 mL   dexamethasone  (DECADRON ) injection 10 mg   Medications administered: We administered iohexol , lidocaine , ropivacaine  (PF) 2 mg/mL (0.2%), sodium chloride  flush, and dexamethasone .  See the medical record for exact dosing, route, and time of administration.  Follow-up plan:   Return in about 6 weeks (around 07/26/2024) for PPE, VV.     Recent Visits Date Type Provider Dept  04/07/24 Office Visit Marcelino Nurse, MD [facility]  Showing recent visits within past 90 days and meeting all other requirements Today's Visits Date Type Provider Dept  06/14/24 Procedure visit Marcelino Nurse, MD [facility]  Showing today's visits and meeting all other requirements Future Appointments Date Type Provider Dept  08/12/24 Appointment Marcelino Nurse, MD [facility]  Showing future appointments within next 90 days and meeting all other requirements   Disposition: Discharge home  Discharge Date & Time: 06/14/2024;   hrs.   Primary Care Physician: Sadie Manna, MD Location: [facility] Note by: Nurse Marcelino, MD Date: 06/14/2024; Time: 9:46 AM  Disclaimer:  Medicine is not an exact science. The only guarantee in medicine is that nothing is guaranteed. It is important to note that the decision to proceed with this intervention was based on the information collected from the patient. The Data and conclusions were drawn from the patient's questionnaire, the interview, and the physical examination. Because the information was provided in large part by the patient, it cannot be guaranteed that it has not been purposely or unconsciously manipulated. Every effort has been made to obtain as much relevant data as possible for this evaluation. It is important to note that the conclusions that lead to this procedure are derived in large part from the available data. Always take into account that the treatment will also be dependent on availability of resources and existing treatment guidelines, considered by other Pain Management Practitioners as being common knowledge and practice, at the time of the intervention. For Medico-Legal purposes, it is also important to point out that variation in procedural techniques and pharmacological choices are the acceptable norm. The indications, contraindications, technique, and results of the above procedure should only be interpreted and judged by a Board-Certified Interventional Pain Specialist with extensive familiarity and expertise in the same exact procedure and technique.

## 2024-06-14 NOTE — Progress Notes (Signed)
 Safety precautions to be maintained throughout the outpatient stay will include: orient to surroundings, keep bed in low position, maintain call bell within reach at all times, provide assistance with transfer out of bed and ambulation.

## 2024-06-15 ENCOUNTER — Telehealth: Payer: Self-pay | Admitting: *Deleted

## 2024-06-15 NOTE — Telephone Encounter (Signed)
 No problems post procedure.

## 2024-08-12 ENCOUNTER — Ambulatory Visit: Admitting: Student in an Organized Health Care Education/Training Program
